# Patient Record
Sex: Female | Born: 1940
Health system: Southern US, Community
[De-identification: ages and names within clinical notes are randomized; demographics above are authoritative.]

## PROBLEM LIST (undated history)

## (undated) DIAGNOSIS — E7800 Pure hypercholesterolemia, unspecified: Secondary | ICD-10-CM

## (undated) DIAGNOSIS — M109 Gout, unspecified: Secondary | ICD-10-CM

## (undated) DIAGNOSIS — E039 Hypothyroidism, unspecified: Secondary | ICD-10-CM

## (undated) DIAGNOSIS — H269 Unspecified cataract: Secondary | ICD-10-CM

## (undated) DIAGNOSIS — K219 Gastro-esophageal reflux disease without esophagitis: Secondary | ICD-10-CM

## (undated) DIAGNOSIS — K589 Irritable bowel syndrome without diarrhea: Secondary | ICD-10-CM

## (undated) DIAGNOSIS — I10 Essential (primary) hypertension: Secondary | ICD-10-CM

## (undated) DIAGNOSIS — R0982 Postnasal drip: Secondary | ICD-10-CM

## (undated) DIAGNOSIS — R3915 Urgency of urination: Secondary | ICD-10-CM

## (undated) DIAGNOSIS — D649 Anemia, unspecified: Secondary | ICD-10-CM

## (undated) DIAGNOSIS — M1990 Unspecified osteoarthritis, unspecified site: Secondary | ICD-10-CM

## (undated) DIAGNOSIS — D573 Sickle-cell trait: Secondary | ICD-10-CM

## (undated) HISTORY — DX: Irritable bowel syndrome, unspecified: K58.9

## (undated) HISTORY — PX: CATARACT EXTRACTION: SUR2

## (undated) HISTORY — PX: DILATION AND CURETTAGE OF UTERUS: SHX78

## (undated) HISTORY — PX: JOINT REPLACEMENT: SHX530

## (undated) HISTORY — PX: COLONOSCOPY W/ BIOPSIES AND POLYPECTOMY: SHX1376

## (undated) HISTORY — PX: ABDOMINAL HYSTERECTOMY: SHX81

---

## 1993-09-21 HISTORY — PX: ABDOMINAL HYSTERECTOMY: SHX81

## 2002-12-07 ENCOUNTER — Encounter: Admission: RE | Admit: 2002-12-07 | Discharge: 2002-12-07 | Payer: Self-pay | Admitting: Emergency Medicine

## 2002-12-07 ENCOUNTER — Encounter: Payer: Self-pay | Admitting: Emergency Medicine

## 2007-03-17 ENCOUNTER — Ambulatory Visit: Payer: Self-pay | Admitting: Gastroenterology

## 2007-03-30 ENCOUNTER — Encounter: Admission: RE | Admit: 2007-03-30 | Discharge: 2007-03-30 | Payer: Self-pay | Admitting: Emergency Medicine

## 2007-04-20 ENCOUNTER — Ambulatory Visit: Payer: Self-pay | Admitting: Gastroenterology

## 2007-04-20 ENCOUNTER — Encounter: Payer: Self-pay | Admitting: Gastroenterology

## 2007-06-07 ENCOUNTER — Ambulatory Visit: Payer: Self-pay | Admitting: Gastroenterology

## 2007-06-13 ENCOUNTER — Ambulatory Visit (HOSPITAL_COMMUNITY): Admission: RE | Admit: 2007-06-13 | Discharge: 2007-06-13 | Payer: Self-pay | Admitting: Gastroenterology

## 2007-08-09 ENCOUNTER — Ambulatory Visit: Payer: Self-pay | Admitting: Gastroenterology

## 2007-08-09 LAB — CONVERTED CEMR LAB
ALT: 36 units/L — ABNORMAL HIGH (ref 0–35)
AST: 27 units/L (ref 0–37)
Albumin: 3.5 g/dL (ref 3.5–5.2)
Alkaline Phosphatase: 85 units/L (ref 39–117)
BUN: 9 mg/dL (ref 6–23)
Basophils Absolute: 0.3 10*3/uL — ABNORMAL HIGH (ref 0.0–0.1)
Basophils Relative: 3.6 % — ABNORMAL HIGH (ref 0.0–1.0)
Bilirubin, Direct: 0.1 mg/dL (ref 0.0–0.3)
CO2: 28 meq/L (ref 19–32)
Calcium: 9.2 mg/dL (ref 8.4–10.5)
Chloride: 100 meq/L (ref 96–112)
Creatinine, Ser: 0.9 mg/dL (ref 0.4–1.2)
Eosinophils Absolute: 0 10*3/uL (ref 0.0–0.6)
Eosinophils Relative: 0.6 % (ref 0.0–5.0)
Ferritin: 345.3 ng/mL — ABNORMAL HIGH (ref 10.0–291.0)
Folate: 18.5 ng/mL
GFR calc Af Amer: 81 mL/min
GFR calc non Af Amer: 67 mL/min
Glucose, Bld: 275 mg/dL — ABNORMAL HIGH (ref 70–99)
HCT: 37.7 % (ref 36.0–46.0)
Hemoglobin: 12.5 g/dL (ref 12.0–15.0)
Iron: 80 ug/dL (ref 42–145)
Lymphocytes Relative: 29.4 % (ref 12.0–46.0)
MCHC: 33 g/dL (ref 30.0–36.0)
MCV: 85.8 fL (ref 78.0–100.0)
Monocytes Absolute: 0.7 10*3/uL (ref 0.2–0.7)
Monocytes Relative: 8.6 % (ref 3.0–11.0)
Neutro Abs: 4.9 10*3/uL (ref 1.4–7.7)
Neutrophils Relative %: 57.8 % (ref 43.0–77.0)
Platelets: 283 10*3/uL (ref 150–400)
Potassium: 3.3 meq/L — ABNORMAL LOW (ref 3.5–5.1)
RBC: 4.39 M/uL (ref 3.87–5.11)
RDW: 11.4 % — ABNORMAL LOW (ref 11.5–14.6)
Saturation Ratios: 26.9 % (ref 20.0–50.0)
Sodium: 139 meq/L (ref 135–145)
TSH: 1.3 microintl units/mL (ref 0.35–5.50)
Total Bilirubin: 0.7 mg/dL (ref 0.3–1.2)
Total Protein: 7.5 g/dL (ref 6.0–8.3)
Transferrin: 212.7 mg/dL (ref >=212.0)
Vitamin B-12: 295 pg/mL (ref 211–911)
WBC: 8.3 10*3/uL (ref 4.5–10.5)

## 2007-08-31 ENCOUNTER — Ambulatory Visit: Payer: Self-pay | Admitting: Gastroenterology

## 2007-08-31 LAB — CONVERTED CEMR LAB
Fecal Occult Blood: NEGATIVE
OCCULT 1: NEGATIVE
OCCULT 2: NEGATIVE
OCCULT 3: NEGATIVE
OCCULT 4: NEGATIVE
OCCULT 5: NEGATIVE

## 2007-09-26 ENCOUNTER — Encounter: Admission: RE | Admit: 2007-09-26 | Discharge: 2007-09-26 | Payer: Self-pay | Admitting: Emergency Medicine

## 2007-10-26 DIAGNOSIS — I10 Essential (primary) hypertension: Secondary | ICD-10-CM | POA: Insufficient documentation

## 2007-10-26 DIAGNOSIS — K2970 Gastritis, unspecified, without bleeding: Secondary | ICD-10-CM | POA: Insufficient documentation

## 2007-10-26 DIAGNOSIS — K449 Diaphragmatic hernia without obstruction or gangrene: Secondary | ICD-10-CM | POA: Insufficient documentation

## 2007-10-26 DIAGNOSIS — K921 Melena: Secondary | ICD-10-CM | POA: Insufficient documentation

## 2007-10-26 DIAGNOSIS — D573 Sickle-cell trait: Secondary | ICD-10-CM | POA: Insufficient documentation

## 2007-10-26 DIAGNOSIS — K219 Gastro-esophageal reflux disease without esophagitis: Secondary | ICD-10-CM | POA: Insufficient documentation

## 2007-10-26 DIAGNOSIS — D539 Nutritional anemia, unspecified: Secondary | ICD-10-CM | POA: Insufficient documentation

## 2007-10-26 DIAGNOSIS — K5909 Other constipation: Secondary | ICD-10-CM | POA: Insufficient documentation

## 2007-10-26 DIAGNOSIS — K2990 Gastroduodenitis, unspecified, without bleeding: Secondary | ICD-10-CM | POA: Insufficient documentation

## 2007-10-26 DIAGNOSIS — D239 Other benign neoplasm of skin, unspecified: Secondary | ICD-10-CM | POA: Insufficient documentation

## 2008-07-17 ENCOUNTER — Emergency Department (HOSPITAL_COMMUNITY): Admission: EM | Admit: 2008-07-17 | Discharge: 2008-07-17 | Payer: Self-pay | Admitting: Emergency Medicine

## 2008-08-13 ENCOUNTER — Ambulatory Visit (HOSPITAL_COMMUNITY): Admission: RE | Admit: 2008-08-13 | Discharge: 2008-08-14 | Payer: Self-pay | Admitting: Obstetrics and Gynecology

## 2008-11-20 ENCOUNTER — Encounter: Admission: RE | Admit: 2008-11-20 | Discharge: 2008-11-20 | Payer: Self-pay | Admitting: Orthopaedic Surgery

## 2008-12-11 ENCOUNTER — Ambulatory Visit (HOSPITAL_BASED_OUTPATIENT_CLINIC_OR_DEPARTMENT_OTHER): Admission: RE | Admit: 2008-12-11 | Discharge: 2008-12-11 | Payer: Self-pay | Admitting: Orthopaedic Surgery

## 2008-12-11 HISTORY — PX: KNEE ARTHROSCOPY: SUR90

## 2009-09-26 ENCOUNTER — Inpatient Hospital Stay (HOSPITAL_COMMUNITY): Admission: RE | Admit: 2009-09-26 | Discharge: 2009-10-01 | Payer: Self-pay | Admitting: Orthopaedic Surgery

## 2009-09-26 HISTORY — PX: TOTAL KNEE ARTHROPLASTY: SHX125

## 2009-10-30 ENCOUNTER — Inpatient Hospital Stay (HOSPITAL_COMMUNITY): Admission: RE | Admit: 2009-10-30 | Discharge: 2009-11-05 | Payer: Self-pay | Admitting: Orthopaedic Surgery

## 2009-10-30 HISTORY — PX: KNEE JOINT MANIPULATION: SHX386

## 2010-10-12 ENCOUNTER — Encounter: Payer: Self-pay | Admitting: Unknown Physician Specialty

## 2010-10-13 ENCOUNTER — Encounter: Payer: Self-pay | Admitting: Gastroenterology

## 2010-10-21 NOTE — Procedures (Signed)
Summary: Gastroenterology -COLON  Gastroenterology -COLON   Imported By: Francee Piccolo CMA 10/27/2007 15:17:58  _____________________________________________________________________  External Attachment:    Type:   Image     Comment:   External Document

## 2010-10-21 NOTE — Procedures (Signed)
Summary: Gastroenterology COLON  Gastroenterology COLON   Imported By: Francee Piccolo CMA 10/27/2007 15:19:37  _____________________________________________________________________  External Attachment:    Type:   Image     Comment:   External Document

## 2010-12-07 LAB — CBC
HCT: 22.6 % — ABNORMAL LOW (ref 36.0–46.0)
HCT: 23.4 % — ABNORMAL LOW (ref 36.0–46.0)
HCT: 25.8 % — ABNORMAL LOW (ref 36.0–46.0)
HCT: 28.1 % — ABNORMAL LOW (ref 36.0–46.0)
HCT: 37.5 % (ref 36.0–46.0)
Hemoglobin: 12.6 g/dL (ref 12.0–15.0)
Hemoglobin: 7.6 g/dL — ABNORMAL LOW (ref 12.0–15.0)
Hemoglobin: 7.9 g/dL — ABNORMAL LOW (ref 12.0–15.0)
Hemoglobin: 8.6 g/dL — ABNORMAL LOW (ref 12.0–15.0)
Hemoglobin: 9.6 g/dL — ABNORMAL LOW (ref 12.0–15.0)
MCHC: 33.3 g/dL (ref 30.0–36.0)
MCHC: 33.6 g/dL (ref 30.0–36.0)
MCHC: 33.8 g/dL (ref 30.0–36.0)
MCHC: 33.8 g/dL (ref 30.0–36.0)
MCHC: 33.8 g/dL (ref 30.0–36.0)
MCV: 86.5 fL (ref 78.0–100.0)
MCV: 87 fL (ref 78.0–100.0)
MCV: 87.2 fL (ref 78.0–100.0)
MCV: 87.5 fL (ref 78.0–100.0)
MCV: 87.8 fL (ref 78.0–100.0)
Platelets: 197 10*3/uL (ref 150–400)
Platelets: 202 10*3/uL (ref 150–400)
Platelets: 211 10*3/uL (ref 150–400)
Platelets: 250 10*3/uL (ref 150–400)
Platelets: 268 10*3/uL (ref 150–400)
RBC: 2.59 MIL/uL — ABNORMAL LOW (ref 3.87–5.11)
RBC: 2.67 MIL/uL — ABNORMAL LOW (ref 3.87–5.11)
RBC: 2.94 MIL/uL — ABNORMAL LOW (ref 3.87–5.11)
RBC: 3.23 MIL/uL — ABNORMAL LOW (ref 3.87–5.11)
RBC: 4.33 MIL/uL (ref 3.87–5.11)
RDW: 12.5 % (ref 11.5–15.5)
RDW: 12.6 % (ref 11.5–15.5)
RDW: 12.8 % (ref 11.5–15.5)
RDW: 13 % (ref 11.5–15.5)
RDW: 13 % (ref 11.5–15.5)
WBC: 10.8 10*3/uL — ABNORMAL HIGH (ref 4.0–10.5)
WBC: 12.7 10*3/uL — ABNORMAL HIGH (ref 4.0–10.5)
WBC: 13.9 10*3/uL — ABNORMAL HIGH (ref 4.0–10.5)
WBC: 14.5 10*3/uL — ABNORMAL HIGH (ref 4.0–10.5)
WBC: 7.9 10*3/uL (ref 4.0–10.5)

## 2010-12-07 LAB — GLUCOSE, CAPILLARY
Glucose-Capillary: 101 mg/dL — ABNORMAL HIGH (ref 70–99)
Glucose-Capillary: 110 mg/dL — ABNORMAL HIGH (ref 70–99)
Glucose-Capillary: 124 mg/dL — ABNORMAL HIGH (ref 70–99)
Glucose-Capillary: 131 mg/dL — ABNORMAL HIGH (ref 70–99)
Glucose-Capillary: 132 mg/dL — ABNORMAL HIGH (ref 70–99)
Glucose-Capillary: 134 mg/dL — ABNORMAL HIGH (ref 70–99)
Glucose-Capillary: 134 mg/dL — ABNORMAL HIGH (ref 70–99)
Glucose-Capillary: 135 mg/dL — ABNORMAL HIGH (ref 70–99)
Glucose-Capillary: 136 mg/dL — ABNORMAL HIGH (ref 70–99)
Glucose-Capillary: 138 mg/dL — ABNORMAL HIGH (ref 70–99)
Glucose-Capillary: 150 mg/dL — ABNORMAL HIGH (ref 70–99)
Glucose-Capillary: 155 mg/dL — ABNORMAL HIGH (ref 70–99)
Glucose-Capillary: 155 mg/dL — ABNORMAL HIGH (ref 70–99)
Glucose-Capillary: 156 mg/dL — ABNORMAL HIGH (ref 70–99)
Glucose-Capillary: 156 mg/dL — ABNORMAL HIGH (ref 70–99)
Glucose-Capillary: 158 mg/dL — ABNORMAL HIGH (ref 70–99)
Glucose-Capillary: 159 mg/dL — ABNORMAL HIGH (ref 70–99)
Glucose-Capillary: 161 mg/dL — ABNORMAL HIGH (ref 70–99)
Glucose-Capillary: 168 mg/dL — ABNORMAL HIGH (ref 70–99)
Glucose-Capillary: 183 mg/dL — ABNORMAL HIGH (ref 70–99)
Glucose-Capillary: 190 mg/dL — ABNORMAL HIGH (ref 70–99)
Glucose-Capillary: 92 mg/dL (ref 70–99)

## 2010-12-07 LAB — BASIC METABOLIC PANEL
BUN: 10 mg/dL (ref 6–23)
BUN: 12 mg/dL (ref 6–23)
BUN: 4 mg/dL — ABNORMAL LOW (ref 6–23)
BUN: 6 mg/dL (ref 6–23)
BUN: 8 mg/dL (ref 6–23)
CO2: 30 mEq/L (ref 19–32)
CO2: 32 mEq/L (ref 19–32)
CO2: 32 mEq/L (ref 19–32)
CO2: 32 mEq/L (ref 19–32)
CO2: 32 mEq/L (ref 19–32)
Calcium: 10.2 mg/dL (ref 8.4–10.5)
Calcium: 8.3 mg/dL — ABNORMAL LOW (ref 8.4–10.5)
Calcium: 8.7 mg/dL (ref 8.4–10.5)
Calcium: 8.9 mg/dL (ref 8.4–10.5)
Calcium: 8.9 mg/dL (ref 8.4–10.5)
Chloride: 100 mEq/L (ref 96–112)
Chloride: 100 mEq/L (ref 96–112)
Chloride: 91 mEq/L — ABNORMAL LOW (ref 96–112)
Chloride: 95 mEq/L — ABNORMAL LOW (ref 96–112)
Chloride: 99 mEq/L (ref 96–112)
Creatinine, Ser: 0.76 mg/dL (ref 0.4–1.2)
Creatinine, Ser: 0.78 mg/dL (ref 0.4–1.2)
Creatinine, Ser: 0.84 mg/dL (ref 0.4–1.2)
Creatinine, Ser: 0.84 mg/dL (ref 0.4–1.2)
Creatinine, Ser: 0.86 mg/dL (ref 0.4–1.2)
GFR calc Af Amer: >60 mL/min (ref >60)
GFR calc Af Amer: >60 mL/min (ref >60)
GFR calc Af Amer: >60 mL/min (ref >60)
GFR calc Af Amer: >60 mL/min (ref >60)
GFR calc Af Amer: >60 mL/min (ref >60)
GFR calc non Af Amer: >60 mL/min (ref >60)
GFR calc non Af Amer: >60 mL/min (ref >60)
GFR calc non Af Amer: >60 mL/min (ref >60)
GFR calc non Af Amer: >60 mL/min (ref >60)
GFR calc non Af Amer: >60 mL/min (ref >60)
Glucose, Bld: 101 mg/dL — ABNORMAL HIGH (ref 70–99)
Glucose, Bld: 145 mg/dL — ABNORMAL HIGH (ref 70–99)
Glucose, Bld: 149 mg/dL — ABNORMAL HIGH (ref 70–99)
Glucose, Bld: 157 mg/dL — ABNORMAL HIGH (ref 70–99)
Glucose, Bld: 164 mg/dL — ABNORMAL HIGH (ref 70–99)
Potassium: 3 mEq/L — ABNORMAL LOW (ref 3.5–5.1)
Potassium: 3.1 mEq/L — ABNORMAL LOW (ref 3.5–5.1)
Potassium: 3.6 mEq/L (ref 3.5–5.1)
Potassium: 3.9 mEq/L (ref 3.5–5.1)
Potassium: 4.1 mEq/L (ref 3.5–5.1)
Sodium: 131 mEq/L — ABNORMAL LOW (ref 135–145)
Sodium: 136 mEq/L (ref 135–145)
Sodium: 138 mEq/L (ref 135–145)
Sodium: 138 mEq/L (ref 135–145)
Sodium: 140 mEq/L (ref 135–145)

## 2010-12-07 LAB — PROTIME-INR
INR: 1.09 (ref 0.00–1.49)
INR: 1.16 (ref 0.00–1.49)
INR: 1.45 (ref 0.00–1.49)
INR: 1.5 — ABNORMAL HIGH (ref 0.00–1.49)
INR: 1.56 — ABNORMAL HIGH (ref 0.00–1.49)
INR: 1.8 — ABNORMAL HIGH (ref 0.00–1.49)
Prothrombin Time: 14 seconds (ref 11.6–15.2)
Prothrombin Time: 14.7 seconds (ref 11.6–15.2)
Prothrombin Time: 17.5 seconds — ABNORMAL HIGH (ref 11.6–15.2)
Prothrombin Time: 18 seconds — ABNORMAL HIGH (ref 11.6–15.2)
Prothrombin Time: 18.5 seconds — ABNORMAL HIGH (ref 11.6–15.2)
Prothrombin Time: 20.7 seconds — ABNORMAL HIGH (ref 11.6–15.2)

## 2010-12-07 LAB — TYPE AND SCREEN
ABO/RH(D): O POS
Antibody Screen: NEGATIVE

## 2010-12-07 LAB — HEMOGLOBIN A1C
Hgb A1c MFr Bld: 6.9 % — ABNORMAL HIGH (ref 4.6–6.1)
Mean Plasma Glucose: 151 mg/dL

## 2010-12-07 LAB — ABO/RH: ABO/RH(D): O POS

## 2010-12-10 LAB — GLUCOSE, CAPILLARY
Glucose-Capillary: 100 mg/dL — ABNORMAL HIGH (ref 70–99)
Glucose-Capillary: 104 mg/dL — ABNORMAL HIGH (ref 70–99)
Glucose-Capillary: 107 mg/dL — ABNORMAL HIGH (ref 70–99)
Glucose-Capillary: 108 mg/dL — ABNORMAL HIGH (ref 70–99)
Glucose-Capillary: 111 mg/dL — ABNORMAL HIGH (ref 70–99)
Glucose-Capillary: 113 mg/dL — ABNORMAL HIGH (ref 70–99)
Glucose-Capillary: 118 mg/dL — ABNORMAL HIGH (ref 70–99)
Glucose-Capillary: 122 mg/dL — ABNORMAL HIGH (ref 70–99)
Glucose-Capillary: 123 mg/dL — ABNORMAL HIGH (ref 70–99)
Glucose-Capillary: 123 mg/dL — ABNORMAL HIGH (ref 70–99)
Glucose-Capillary: 125 mg/dL — ABNORMAL HIGH (ref 70–99)
Glucose-Capillary: 127 mg/dL — ABNORMAL HIGH (ref 70–99)
Glucose-Capillary: 134 mg/dL — ABNORMAL HIGH (ref 70–99)
Glucose-Capillary: 134 mg/dL — ABNORMAL HIGH (ref 70–99)
Glucose-Capillary: 136 mg/dL — ABNORMAL HIGH (ref 70–99)
Glucose-Capillary: 136 mg/dL — ABNORMAL HIGH (ref 70–99)
Glucose-Capillary: 137 mg/dL — ABNORMAL HIGH (ref 70–99)
Glucose-Capillary: 140 mg/dL — ABNORMAL HIGH (ref 70–99)
Glucose-Capillary: 141 mg/dL — ABNORMAL HIGH (ref 70–99)
Glucose-Capillary: 142 mg/dL — ABNORMAL HIGH (ref 70–99)
Glucose-Capillary: 155 mg/dL — ABNORMAL HIGH (ref 70–99)
Glucose-Capillary: 156 mg/dL — ABNORMAL HIGH (ref 70–99)
Glucose-Capillary: 83 mg/dL (ref 70–99)
Glucose-Capillary: 88 mg/dL (ref 70–99)

## 2010-12-10 LAB — CBC
HCT: 34 % — ABNORMAL LOW (ref 36.0–46.0)
Hemoglobin: 11.1 g/dL — ABNORMAL LOW (ref 12.0–15.0)
MCHC: 32.8 g/dL (ref 30.0–36.0)
MCV: 85.7 fL (ref 78.0–100.0)
Platelets: 327 10*3/uL (ref 150–400)
RBC: 3.97 MIL/uL (ref 3.87–5.11)
RDW: 14.4 % (ref 11.5–15.5)
WBC: 9.5 10*3/uL (ref 4.0–10.5)

## 2010-12-10 LAB — BASIC METABOLIC PANEL
BUN: 10 mg/dL (ref 6–23)
CO2: 28 mEq/L (ref 19–32)
Calcium: 9.6 mg/dL (ref 8.4–10.5)
Chloride: 102 mEq/L (ref 96–112)
Creatinine, Ser: 0.74 mg/dL (ref 0.4–1.2)
GFR calc Af Amer: >60 mL/min (ref >60)
GFR calc non Af Amer: >60 mL/min (ref >60)
Glucose, Bld: 83 mg/dL (ref 70–99)
Potassium: 3.3 mEq/L — ABNORMAL LOW (ref 3.5–5.1)
Sodium: 137 mEq/L (ref 135–145)

## 2010-12-10 LAB — URINALYSIS, ROUTINE W REFLEX MICROSCOPIC
Bilirubin Urine: NEGATIVE
Glucose, UA: NEGATIVE mg/dL
Hgb urine dipstick: NEGATIVE
Ketones, ur: NEGATIVE mg/dL
Nitrite: NEGATIVE
Protein, ur: NEGATIVE mg/dL
Specific Gravity, Urine: 1.018 (ref 1.005–1.030)
Urobilinogen, UA: 0.2 mg/dL (ref 0.0–1.0)
pH: 5.5 (ref 5.0–8.0)

## 2010-12-10 LAB — PROTIME-INR
INR: 1.09 (ref 0.00–1.49)
Prothrombin Time: 14 seconds (ref 11.6–15.2)

## 2010-12-10 LAB — HEMOGLOBIN A1C
Hgb A1c MFr Bld: 6.3 % — ABNORMAL HIGH (ref 4.6–6.1)
Mean Plasma Glucose: 134 mg/dL

## 2010-12-10 LAB — APTT: aPTT: 27 seconds (ref 24–37)

## 2010-12-16 ENCOUNTER — Other Ambulatory Visit: Payer: Self-pay | Admitting: Geriatric Medicine

## 2010-12-16 DIAGNOSIS — Z1231 Encounter for screening mammogram for malignant neoplasm of breast: Secondary | ICD-10-CM

## 2011-01-01 LAB — POCT I-STAT, CHEM 8
BUN: 14 mg/dL (ref 6–23)
Calcium, Ion: 1.25 mmol/L (ref 1.12–1.32)
Chloride: 102 mEq/L (ref 96–112)
Creatinine, Ser: 0.9 mg/dL (ref 0.4–1.2)
Glucose, Bld: 116 mg/dL — ABNORMAL HIGH (ref 70–99)
HCT: 43 % (ref 36.0–46.0)
Hemoglobin: 14.6 g/dL (ref 12.0–15.0)
Potassium: 4 mEq/L (ref 3.5–5.1)
Sodium: 140 mEq/L (ref 135–145)
TCO2: 33 mmol/L (ref 0–100)

## 2011-01-01 LAB — GLUCOSE, CAPILLARY: Glucose-Capillary: 75 mg/dL (ref 70–99)

## 2011-01-16 ENCOUNTER — Ambulatory Visit: Payer: Self-pay

## 2011-01-19 ENCOUNTER — Ambulatory Visit: Payer: Self-pay

## 2011-01-20 ENCOUNTER — Inpatient Hospital Stay: Admission: RE | Admit: 2011-01-20 | Payer: Self-pay | Source: Ambulatory Visit

## 2011-01-28 ENCOUNTER — Ambulatory Visit: Payer: Self-pay

## 2011-01-29 ENCOUNTER — Ambulatory Visit
Admission: RE | Admit: 2011-01-29 | Discharge: 2011-01-29 | Disposition: A | Discharge status: Home / Self Care | Payer: Medicare Other | Source: Ambulatory Visit | Attending: Geriatric Medicine | Admitting: Geriatric Medicine

## 2011-01-29 DIAGNOSIS — Z1231 Encounter for screening mammogram for malignant neoplasm of breast: Secondary | ICD-10-CM

## 2011-02-03 NOTE — Assessment & Plan Note (Signed)
[facility]                         GASTROENTEROLOGY OFFICE NOTE   [facility], Edwards                      MRN:          [id number]  DATE:03/17/2007                            DOB:          1940/11/13    REFERRING PHYSICIAN:  Reuben Edwards, M.D.   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  She is referred through the courtesy of [facility] for  evaluation of guaiac positive stools.   Taylor Edwards has a long GI history going back some 10 to 15 years when  she used to be a patient of [facility].  She has had chronic functional  constipation with laxative dependency for many years, and apparently in  the past has had flexible sigmoidoscopy exams and barium enemas, and  upper GI series, all of which have been negative per the patient's  historical recollection.  She currently has rather marked constipation  and takes daily herbal tea.  She has had no real melena or hematochezia,  but did have a guaiac positive stool test on Hemoccult per [facility].  She additionally has chronic acid reflux symptoms and she takes over-the-  counter PPI therapy after being on Prevacid 30 mg a day when she had  samples.  She follows a regular diet and denies lactose intolerance, or  any other food intolerances.  Her appetite is good.  Her weight is  stable.  She has had no dysphagia, odynophagia, or known hepatobiliary  problems.   PAST MEDICAL HISTORY:  Remarkable for sickle cell trait and she  occasionally has hemolysis, and occasionally takes iron tablets.  She  has had previous appendectomy and hysterectomy.  Recent lab data from  [facility] office showed a normal CBC, metabolic profile, and thyroid  function test.  She does have mild hypertension.   MEDICATIONS:  1. Hydrochlorothiazide 25 mg a day.  2. Imipramine 25 mg a day.  3. Calcium with vitamin D daily.  4. Over-the-counter PPI.   She is intolerant to  ASPIRIN products.   FAMILY HISTORY:  Entirely noncontributory without any known history of  colon polyps or carcinoma.  There is atherogenesis, diabetes, and  alcoholism in her family.   SOCIAL HISTORY:  She is divorced and lives by herself.  She does not  smoke, or abuse ethanol.   REVIEW OF SYSTEMS:  Entirely noncontributory without current  cardiovascular, pulmonary, genitourinary, neurologic, or endocrine  problems.   PHYSICAL EXAM:  She is a healthy-appearing black female appearing her  stated age.  She is in no acute distress.  She is 5 feet 2 inches tall and weighs 212 pounds.  Blood pressure is  134/62, and pulse was 100 and regular.  I could not appreciate stigmata of chronic liver disease.  CHEST:  Clear.  She was in a regular rhythm without murmurs, gallops, or rubs.  ABDOMEN:  Showed mild obesity, but no organomegaly, masses, or  tenderness.  Peripheral extremities were unremarkable.  Mental status was clear.  RECTAL:  Exam was deferred.   ASSESSMENT:  1. Guaiac positive stools  with chronic functional constipation - rule      out colonic polyposis or carcinoma.  2. Chronic gastroesophageal reflux disease - rule out Barrett's      mucosa.  3. Sickle cell trait and chronic anemia.  4. History of aspirin sensitivity.  5. Status post hysterectomy and appendectomy.  6. History of essential hypertension on hydrochlorothiazide.  7. Dermatofibromas.   RECOMMENDATIONS:  1. Outpatient endoscopy and colonoscopy at her convenience.  2. I have given her more samples of Prevacid to take 30 mg before      breakfast each morning.  3. Continue other medications.  Followup with [facility] as previously      planned.     Taylor Rea. Jarold Motto, MD, [facility], FAGA  Electronically Signed    DRP/MedQ  DD: 03/17/2007  DT: 03/17/2007  Job #: [id number]

## 2011-02-03 NOTE — Assessment & Plan Note (Signed)
[facility]                         GASTROENTEROLOGY OFFICE NOTE   Taylor Edwards, Taylor Edwards                      MRN:          [id number]  DATE:08/09/2007                            DOB:          [birth year]    Nolene continues to have some epigastric discomfort which is worse when  she runs out of her Prevacid which she has taken twice a day.  She is  also using PC Carafate and Amitiza 8 mcg twice a day.  Her ultrasound  was unremarkable except for what appeared to be some fatty infiltration  of her liver.  Review of previous biopsy showed melanosis coli but no  evidence of Helicobacter infection.  I do not have any lab data  recently.   She is a healthy-appearing black female in no distress.  She weighs 212  pounds which is the same as December.  Blood pressure was 110/80 and  pulse was 100 and regular.  I could not appreciate hepatosplenomegaly,  abdominal masses or tenderness.  Bowel sounds were normal.   ASSESSMENT:  1. Chronic gastroesophageal reflux disease with fairly negative      endoscopy.  2. Probable fatty infiltration of the liver.  3. Chronic functional constipation.   RECOMMENDATIONS:  1. Change from Prevacid to Protonix 40 mg twice a day since this is a      tier II on a drug plan.  2. Continue all other medications as listed above with drug samples      given.  3. Check CBC and metabolic profile including liver function tests.     Vania Rea. Jarold Motto, MD, Caleen Essex, FAGA  Electronically Signed    DRP/MedQ  DD: 08/09/2007  DT: 08/10/2007  Job #: [id number]

## 2011-02-03 NOTE — Assessment & Plan Note (Signed)
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 Lorenz Coaster for guaiac positive stools  and rather marked acid reflux symptoms.  She underwent endoscopy on April 20, 2007, which was fairly unremarkable.  It included biopsies of  esophagus.  Biopsies for H.pylori gastritis were negative.  She also had  a colonoscopy exam which was entirely normal.   Despite being on Prevacid 30 mg daily, the patient continues to  complaint of subxiphoid discomfort and burning substernal chest pain  with regurgitation.  She denies dysphagia or odynophagia.  She has no  specific hepatobiliary complaints, but I cannot see where she has had  previous gallbladder ultrasound exam.  She has been on Amitiza 24 mcg  b.i.d. with rather marked improvement in her gas, bloating and  constipation.  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  I do not have any recent laboratory data concerning  this patient, but from what I gather, she has not been anemic.  She does  take daily HCTZ 25 mg daily and imipramine 25 mg daily.   She weighs 212 pounds which is her normal weight and blood pressure  132/80.  Pulse was 92 and regular.  She was a healthy appearing, middle-  aged black female in no acute distress.  I could not appreciate stigmata  of chronic liver disease.  Chest was clear, and she is in a regular  rhythm without murmurs, gallops, rubs.  I could not appreciate  hepatosplenomegaly, abdominal masses or specific areas of tenderness.  Bowel sounds were normal.  Rectal exam was deferred today.   ASSESSMENT:  1. Chronic  functional constipation doing well on Amitiza.  2. Guaiac positive stools which is probably a false positive exam.  3. Chronic GERD - rule out cholelithiasis.  4. Well-controlled essential hypertension.  5. History of sickle cell trait chronic anemia.  6. Status post hysterectomy and appendectomy.   RECOMMENDATIONS:  1. Increase Prevacid to 30 mg b.i.d. 30 minutes before breakfast and      supper.  2. Trial of Carafate suspension 15-20 minutes after meals and at      bedtime with standard antireflux maneuvers.  3. Outpatient abdominal ultrasound exam.  4. Continue b.i.d. Amitiza therapy.  5. GI follow up in one months time and will repeat hemoccult cards at      that time.     Vania Rea. Jarold Motto, MD, Caleen Essex, FAGA  Electronically Signed    DRP/MedQ  DD: 06/07/2007  DT: 06/08/2007  Job #: [id number]   cc:   Reuben Likes, M.D.

## 2011-02-03 NOTE — Op Note (Signed)
NAMESAYLOR, MURRY               ACCOUNT NO.:  [id number]   MEDICAL RECORD NO.:  [id number]          PATIENT TYPE:  AMB   LOCATION:  DSC                          FACILITY:  [facility]   PHYSICIAN:  Lubertha Basque. Dalldorf, M.D.DATE OF BIRTH:  Jun 13, 1941   DATE OF PROCEDURE:  12/11/2008  DATE OF DISCHARGE:                               OPERATIVE REPORT   PREOPERATIVE DIAGNOSES:  1. Right knee chondromalacia patella.  2. Left knee chondromalacia patella.   POSTOPERATIVE DIAGNOSES:  1. Right knee chondromalacia patella.  2. Left knee chondromalacia patella.   PROCEDURES:  1. Right knee arthroscopic chondroplasty.  2. Left knee arthroscopic chondroplasty and removal of loose bodies.   ANESTHESIA:  General.   ATTENDING SURGEON:  Lubertha Basque. 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  She has persisted with bilateral knee pain and swelling since an  accident many months back.  This has persisted despite oral anti-  inflammatories and stretching as well as aspiration and injection.  She  has a relatively benign MRI scan on one knee.  She is presumed to have  chondromalacia patella with some intra-articular loose bodies and is  offered bilateral arthroscopies as she has continued pain at rest and  pain with activity.  Informed operative consent was obtained after  discussion of possible complications including reaction to anesthesia  and infection.   SUMMARY OF FINDINGS AND PROCEDURE:  Under general anesthesia,  arthroscopies of both knees were performed.  Findings were basically  identical.  She had grade 4 change patellofemoral and chondroplasties  were done.  She had articular loose bodies throughout the knees and  arthroscopic removal was done.  Medial and lateral compartments were  benign on both sides and ACLs were intact.   DESCRIPTION OF PROCEDURE:  The patient was taken to the operating suite  where general anesthetic was applied without difficulty.  She was  positioned supine and prepped and draped in normal sterile fashion.  After administration of IV Kefzol, an arthroscopy of the left knee was  performed through two portals.  Findings were as noted above.  Procedure  consisted of a thorough chondroplasty patellofemoral portion of her knee  which actually tracked in a relatively good position.  She had some  loose flaps of articular cartilage and grade 4 change across broad  areas.  Some intra-articular loose bodies were removed.  As mentioned  above, the medial and lateral compartments were benign, and the ACL was  intact.  She had then the opposite knee addressed.  We again performed  an arthroscopy through two portals and findings and procedure were  identical.  Both knees were thoroughly irrigated at the end of the case  and we did place some Depo-Medrol onto each knee along with Marcaine and  morphine.  Adaptic was applied to the portals on both sides followed by  dry gauze and loose Ace wraps.  Estimated blood loss and intraoperative  fluids obtained from anesthesia records.   DISPOSITION:  The patient was  extubated in the operating room and taken  to recovery room in stable addition.  She was to go home the same day  and follow up in the office in less than a week.  I will contact her by  phone tonight.      Lubertha Basque Jerl Santos, M.D.  Electronically Signed     PGD/MEDQ  D:  12/11/2008  T:  12/11/2008  Job:  [id number]

## 2011-02-03 NOTE — Op Note (Signed)
NAMECHRISHELLE, ZITO               ACCOUNT NO.:  [id number]   MEDICAL RECORD NO.:  [id number]          PATIENT TYPE:  OIB   LOCATION:  9305                          FACILITY:  [facility]   PHYSICIAN:  Miguel Aschoff, M.D.       DATE OF BIRTH:  May 14, 1941   DATE OF PROCEDURE:  08/13/2008  DATE OF DISCHARGE:                               OPERATIVE REPORT   PREOPERATIVE DIAGNOSIS:  Symptomatic rectocele.   POSTOPERATIVE DIAGNOSIS:  Symptomatic rectocele.   PROCEDURE:  Posterior colporrhaphy.   SURGEON:  Miguel Aschoff, MD   ASSISTANT:  Randye Lobo, MD   ANESTHESIA:  General.   COMPLICATIONS:  None.   JUSTIFICATION:  The patient is a 70 year old black female who reports  pelvic pressure and a satisfactory intercourse and on examination, she  was noted to have a significant rectocele as well as relaxation of the  perineal body secondary to old obstetric trauma.  Because of the  symptoms associated with a rectocele, she is requested that repair be  carried out.  Informed consent has been obtained.  The patient is now  being taken to the operating room to undergo posterior colporrhaphy.   PROCEDURE:  The patient was taken to the operating and placed in the  supine position.  General anesthesia was administered without  difficulty.  She was then placed in a dorsal lithotomy position, prepped  and draped in the usual sterile fashion.  Bladder was catheterized.  At  this point, examination revealed genitalia to be somewhat disfigured  secondary to removal of the left Bartholin gland cyst with the left  labia minora midway from the urethra down to the perineum to be absent  on the left side.  Inspection revealed relaxed perineal body and a grade  2 to grade 3 rectocele.  No other abnormalities were noted.  The patient  has had previous hysterectomy.  At this point, Allis clamps were placed  on the junction of the vaginal mucosa and perineal skin.  The posterior  vaginal wall was injected with 1%  Xylocaine with epinephrine.  An  ellipse of perineal skin was then incised and elevated and then  dissection was carried out in the midline of the vagina for  approximately 6 cm, freeing the pararectal fascia and from the vaginal  mucosa.  Once the rectocele had been exposed, the rectocele was reduced  using pursestring suture of 2-0 Vicryl and the fascia was repaired by  using interrupted 0 Vicryl sutures.  Once this was done, the excess  vaginal mucosa was trimmed, then using running interlocking 2 Vicryl  suture, the vaginal mucosa was reapproximated.  At this point, attention  was directed to the perineal body.  The prior scarring, which had been  present,  had been opened to restore the anatomy to normal  configuration, and then the anatomy was reconfigured using interrupted 0  Vicryl sutures, reapproximating the tissues of the perineal body in the  left, right, and midline.  After this was done, there was good support.  The remaining excess skin and vaginal mucosa was trimmed and then the  perineum was reconstituted by using subcutaneous 2-0 Vicryl suture in a  running fashion and then using subcuticular 2-0 Vicryl to close the  perineal skin.  At this point, iodoform pack was placed in the vagina.  The blood loss for the procedure had been  minimal.  The patient was then reversed from the anesthetic and taken to  recovery room in satisfactory condition.  The estimated blood loss again  was minimal.  Plan is for the patient be observed overnight and  discharged home on August 14, 2008.      Miguel Aschoff, M.D.  Electronically Signed     AR/MEDQ  D:  08/13/2008  T:  08/14/2008  Job:  [id number]

## 2011-02-06 NOTE — Discharge Summary (Signed)
[facility], Taylor Edwards               ACCOUNT NO.:  [id number]   MEDICAL RECORD NO.:  [id number]          PATIENT TYPE:  OIB   LOCATION:  9305                          FACILITY:  [facility]   PHYSICIAN:  Miguel Aschoff, M.D.       DATE OF BIRTH:  08-29-1941   DATE OF ADMISSION:  08/13/2008  DATE OF DISCHARGE:  08/14/2008                               DISCHARGE SUMMARY   ADMISSION DIAGNOSIS:  Symptomatic rectocele.   FINAL DIAGNOSIS:  Symptomatic rectocele.   OPERATIONS AND PROCEDURES:  Posterior colporrhaphy, general anesthesia.   BRIEF HISTORY:  The patient is a 70 year old black female reported  symptoms associated with pelvic pressure and satisfactory intercourse  and on examination was noted to have significant rectocele and appeared  that be secondary to old obstetric trauma.  In addition, she is noted to  have some deformity of the left labia again also secondary to previous  obstetric trauma.  Because of her symptomatology she had requested that  this area be repaired and was admitted to the hospital to undergo  posterior colporrhaphy.  The risks and benefits of procedure were  discussed with the patient.   Preoperative studies were obtained.  This included admission hemoglobin  of 12.8, hematocrit 38.5, and white count 7600.  PT/PTT were within  normal limits.  Chemistry profile was essentially normal.  Urinalysis  was negative.  The patient had an EKG performed, which showed normal  sinus rhythm, minimal voltage criteria for LVH.   HOSPITAL COURSE:  Under general anesthesia on August 13, 2008 a  posterior colporrhaphy was carried out without difficulty.  The  patient's postoperative course was essentially uncomplicated other than  requiring pain medication the evening and morning after surgery.  The  patient did well, ambulated and was able to be sent home on August 14, 2008 in satisfactory condition.  Medications for home included Tylox  every 3 hours as needed for pain.   Estrace screen 2 g 2 times per week  in the vagina.  She is instructed to use a stool softener, refrain from  placing anything in vagina for 4 weeks.  The patient was to be seen back  in 4 weeks for followup.  Examination she was sent home on a regular  diet in satisfactory condition.   FINAL DIAGNOSIS:  Symptomatic rectocele.      Miguel Aschoff, M.D.  Electronically Signed     AR/MEDQ  D:  09/18/2008  T:  09/19/2008  Job:  [id number]

## 2011-04-13 ENCOUNTER — Telehealth: Payer: Self-pay | Admitting: Gastroenterology

## 2011-04-13 NOTE — Telephone Encounter (Signed)
Pt seen in 2008 per + guiac stool by Dr Idell Pickles. Hx of reflux, fatty liver and chronic constipation. Pt had COLON on 04/20/2007 that was normal except for melanosis from chronic laxative abuse. On f/u appt on 08/09/2007,  she was given samples of Amitiza for constipation and we haven't seen her since. Pt wants to know how often she can take laxatives, and I guess what laxative can she safely take? Thanks.

## 2011-04-14 NOTE — Telephone Encounter (Signed)
ok 

## 2011-04-14 NOTE — Telephone Encounter (Signed)
Informed pt she may take Miralax safely; may take BID until she is regular. Also, informed her she needs an OV. Pt reports she is due for her COLON in August of this year. Pt wants to know if it's safe to take Senekot-S daily- they used it in the nursing home so it must be safe. Informed her the "S" is a laxative and sometimes can cause cramping, but it's the only thing that works for her. Dr Jarold Motto, wants to know from you is Senekot-S safe to use daily? Pt is a Engineer, civil (consulting).

## 2011-04-14 NOTE — Telephone Encounter (Signed)
Needs ov...miralax is ok

## 2011-04-15 NOTE — Telephone Encounter (Signed)
Notified pt Dr Jarold Motto stated it's ok to take the Senekot S as a laxative. She will call later to schedule her OV.

## 2011-05-14 ENCOUNTER — Other Ambulatory Visit: Payer: Self-pay | Admitting: Obstetrics and Gynecology

## 2011-06-23 LAB — URINALYSIS, ROUTINE W REFLEX MICROSCOPIC
Bilirubin Urine: NEGATIVE
Glucose, UA: NEGATIVE
Hgb urine dipstick: NEGATIVE
Ketones, ur: NEGATIVE
Nitrite: NEGATIVE
Protein, ur: NEGATIVE
Specific Gravity, Urine: 1.005
Urobilinogen, UA: 0.2
pH: 5.5

## 2011-06-23 LAB — BASIC METABOLIC PANEL
BUN: 15
CO2: 31
Calcium: 9.4
Chloride: 104
Creatinine, Ser: 0.96
GFR calc Af Amer: >60
GFR calc non Af Amer: 58 — ABNORMAL LOW
Glucose, Bld: 99
Potassium: 3.5
Sodium: 143

## 2011-06-23 LAB — CBC
HCT: 32.7 — ABNORMAL LOW
HCT: 38.5
Hemoglobin: 10.9 — ABNORMAL LOW
Hemoglobin: 12.8
MCHC: 33.2
MCHC: 33.3
MCV: 86.7
MCV: 88
Platelets: 209
Platelets: 292
RBC: 3.72 — ABNORMAL LOW
RBC: 4.44
RDW: 13.5
RDW: 13.7
WBC: 7.6
WBC: 9.6

## 2011-06-23 LAB — APTT: aPTT: 27

## 2011-06-23 LAB — URINE MICROSCOPIC-ADD ON

## 2011-06-23 LAB — PROTIME-INR
INR: 1
Prothrombin Time: 13

## 2011-06-23 LAB — GLUCOSE, CAPILLARY: Glucose-Capillary: 105 — ABNORMAL HIGH

## 2011-08-21 ENCOUNTER — Encounter (HOSPITAL_BASED_OUTPATIENT_CLINIC_OR_DEPARTMENT_OTHER): Payer: Self-pay | Admitting: *Deleted

## 2011-08-21 NOTE — Pre-Procedure Instructions (Addendum)
To come for BMET and EKG  2 crowns upper front teeth  States current rash on knee

## 2011-08-22 NOTE — H&P (Signed)
Patient Name: Taylor Edwards DOB: 1941/08/15 MRN: [id number]  Date of Service: 08/19/2011  SUBJECTIVE:  Taylor Edwards is here with a multitude of questions and problems.  She mostly would like the right foot bunion taken care of.  She has had trouble with that for years and it has become progressively worse where she cannot wear shoes she would like to wear.  It limits her ability to remain active and she would like it fixed.  She has tried wide shoes and pads.  She has never had an injection in the area.  She describes her pain there as constant and moderate.  She says the opposite knee bothers her a bit, but not quite so badly.  Her other issues involve her right knee which remains a little painful anterolateral.  She feels a bit of a catch in it.  She also fell at the [facility] 3 or 4 months ago and injured her right wrist but is using some topicals on that and feels that it is getting better.  She also has questions about her back.  She has been through physical therapy and tried medications.  She even had some injections through Dr. Jordan Likes earlier this year.  PAST MEDICAL HISTORY:  Her past medical, family, and social histories are reviewed and are unchanged based on her most recent intake sheet which was done on 10/29/08.  Her medical coverage is now mostly through Dr. Merlene Laughter.  Her only allergy is perhaps to morphine which she thinks has caused some hair loss.  One change in her medications is that she has taken herself off her oral diabetes meds and is now just on hydrochlorothiazide and lisinopril for hypertension.  She says she has checked her blood sugars since coming off the medicines for diabetes and says they have been fine.  Review of systems reviewed thoroughly and all other systems are negative as related to the chief complaint.  PHYSICAL EXAM:  Seini remains a well-developed, well-nourished woman in no apparent distress and oriented x3.  She walks with a fairly normal gait.  She has well-healed  incisions about both knees and motion on each side is about 0 to 100 at this point.  She has some anterolateral pain at the right knee along the joint line and a bit of a catch that she can feel but that I do not really appreciate.  Again, I do not feel an effusion and there is no redness or warmth through the knee.  Hip motion is full and straight leg raise is negative although it causes some back pain.  She has pain to palpation about the low back and pain on rotation in the lumbar spine.  Her skin looks benign and there are no scars that I can appreciate.  Her right wrist looks relatively benign and there is some pain along the first dorsal compartment and her Lourena Simmonds is mildly positive.  RADIOGRAPHS:  X-rays were ordered, performed, and interpreted by me today.  We took 3 views of both knees and they show good placement of her components with no sign of any loosening.  We also took 3 views of each foot.  She does have a moderate-sized bunion on the right and a fairly small bunion on the left with no significant underlying degenerative change.  I pulled up some films of her lumbar spine from 07/02/10 and these look relatively good with no sign of fracture or significant malalignment.  ASSESSMENT:   1.    Right foot bunion. 2.  Lumbar spondylosis with history of injections earlier this year. 3.    Bilateral TKR 2011 with right-sided catch.   RE: Taylor, Edwards August 19, 2011 Page 2  PLAN:  Jernie really would like her bunion addressed.  I think a simple Silver bunionectomy would do her well and I have reviewed risks of anesthesia and infection related to this intervention.  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  In terms of her knees, I think she is doing relatively well.  I suppose one thing we could offer her is an injection  into the replaced right knee under sterile technique.  We could probably accomplish that at the time of her foot surgery.  In terms of her back, she would like this worked up more thoroughly with an MRI scan and I do not see any contraindication to that, so I will try to get that approved and set up through her insurance carrier.  I certainly am glad she has a little bit better motion about her knees and is fairly functional.  I am a bit concerned that she has stopped her diabetes medicine and I have told her she probably ought to run that by Dr. Pete Glatter.   Electronically verified by:    Lubertha Basque. Jerl Santos, MD

## 2011-08-24 ENCOUNTER — Encounter (HOSPITAL_BASED_OUTPATIENT_CLINIC_OR_DEPARTMENT_OTHER)
Admission: RE | Admit: 2011-08-24 | Discharge: 2011-08-24 | Disposition: A | Discharge status: Home / Self Care | Payer: Medicare Other | Source: Ambulatory Visit | Attending: Orthopaedic Surgery | Admitting: Orthopaedic Surgery

## 2011-08-24 ENCOUNTER — Other Ambulatory Visit: Payer: Self-pay

## 2011-08-24 ENCOUNTER — Other Ambulatory Visit: Payer: Self-pay | Admitting: Orthopaedic Surgery

## 2011-08-24 LAB — BASIC METABOLIC PANEL
BUN: 11 mg/dL (ref 6–23)
CO2: 31 mEq/L (ref 19–32)
Calcium: 9.7 mg/dL (ref 8.4–10.5)
Chloride: 105 mEq/L (ref 96–112)
Creatinine, Ser: 0.77 mg/dL (ref 0.50–1.10)
GFR calc Af Amer: >90 mL/min (ref >90)
GFR calc non Af Amer: 84 mL/min — ABNORMAL LOW (ref >90)
Glucose, Bld: 139 mg/dL — ABNORMAL HIGH (ref 70–99)
Potassium: 3.6 mEq/L (ref 3.5–5.1)
Sodium: 146 mEq/L — ABNORMAL HIGH (ref 135–145)

## 2011-08-25 ENCOUNTER — Encounter (HOSPITAL_BASED_OUTPATIENT_CLINIC_OR_DEPARTMENT_OTHER): Payer: Self-pay | Admitting: *Deleted

## 2011-08-25 ENCOUNTER — Encounter (HOSPITAL_BASED_OUTPATIENT_CLINIC_OR_DEPARTMENT_OTHER)
Admission: RE | Disposition: A | Discharge status: Home / Self Care | Payer: Self-pay | Source: Ambulatory Visit | Attending: Orthopaedic Surgery

## 2011-08-25 ENCOUNTER — Encounter (HOSPITAL_BASED_OUTPATIENT_CLINIC_OR_DEPARTMENT_OTHER): Payer: Self-pay | Admitting: Anesthesiology

## 2011-08-25 ENCOUNTER — Ambulatory Visit (HOSPITAL_BASED_OUTPATIENT_CLINIC_OR_DEPARTMENT_OTHER)
Admission: RE | Admit: 2011-08-25 | Discharge: 2011-08-25 | Disposition: A | Discharge status: Home / Self Care | Payer: Medicare Other | Source: Ambulatory Visit | Attending: Orthopaedic Surgery | Admitting: Orthopaedic Surgery

## 2011-08-25 ENCOUNTER — Ambulatory Visit (HOSPITAL_BASED_OUTPATIENT_CLINIC_OR_DEPARTMENT_OTHER): Payer: Medicare Other | Admitting: Anesthesiology

## 2011-08-25 DIAGNOSIS — I10 Essential (primary) hypertension: Secondary | ICD-10-CM | POA: Insufficient documentation

## 2011-08-25 DIAGNOSIS — M659 Unspecified synovitis and tenosynovitis, unspecified site: Secondary | ICD-10-CM | POA: Insufficient documentation

## 2011-08-25 DIAGNOSIS — E669 Obesity, unspecified: Secondary | ICD-10-CM | POA: Insufficient documentation

## 2011-08-25 DIAGNOSIS — E119 Type 2 diabetes mellitus without complications: Secondary | ICD-10-CM | POA: Insufficient documentation

## 2011-08-25 DIAGNOSIS — K219 Gastro-esophageal reflux disease without esophagitis: Secondary | ICD-10-CM | POA: Insufficient documentation

## 2011-08-25 DIAGNOSIS — Z01812 Encounter for preprocedural laboratory examination: Secondary | ICD-10-CM | POA: Insufficient documentation

## 2011-08-25 DIAGNOSIS — M21611 Bunion of right foot: Secondary | ICD-10-CM

## 2011-08-25 DIAGNOSIS — Z01810 Encounter for preprocedural cardiovascular examination: Secondary | ICD-10-CM | POA: Insufficient documentation

## 2011-08-25 DIAGNOSIS — M21619 Bunion of unspecified foot: Secondary | ICD-10-CM | POA: Insufficient documentation

## 2011-08-25 DIAGNOSIS — J45909 Unspecified asthma, uncomplicated: Secondary | ICD-10-CM | POA: Insufficient documentation

## 2011-08-25 HISTORY — DX: Sickle-cell trait: D57.3

## 2011-08-25 HISTORY — PX: BUNIONECTOMY: SHX129

## 2011-08-25 HISTORY — DX: Gastro-esophageal reflux disease without esophagitis: K21.9

## 2011-08-25 HISTORY — DX: Anemia, unspecified: D64.9

## 2011-08-25 HISTORY — PX: INJECTION KNEE: SHX2446

## 2011-08-25 HISTORY — DX: Unspecified cataract: H26.9

## 2011-08-25 HISTORY — DX: Unspecified osteoarthritis, unspecified site: M19.90

## 2011-08-25 HISTORY — DX: Essential (primary) hypertension: I10

## 2011-08-25 HISTORY — DX: Postnasal drip: R09.82

## 2011-08-25 HISTORY — DX: Urgency of urination: R39.15

## 2011-08-25 LAB — GLUCOSE, CAPILLARY
Glucose-Capillary: 134 mg/dL — ABNORMAL HIGH (ref 70–99)
Glucose-Capillary: 117 mg/dL — ABNORMAL HIGH (ref 70–99)

## 2011-08-25 SURGERY — BUNIONECTOMY
Anesthesia: General | Site: Knee | Laterality: Right | Wound class: Clean

## 2011-08-25 MED ORDER — CEFAZOLIN SODIUM-DEXTROSE 2-3 GM-% IV SOLR: 2.0000 g | INTRAVENOUS | Status: DC

## 2011-08-25 MED ORDER — LIDOCAINE HCL (CARDIAC) 20 MG/ML IV SOLN
INTRAVENOUS | Status: DC | PRN
  Administered 2011-08-25: 40 mg via INTRAVENOUS

## 2011-08-25 MED ORDER — OXYCODONE-ACETAMINOPHEN 5-325 MG PO TABS: 1.0000 | ORAL_TABLET | ORAL | Status: AC | PRN

## 2011-08-25 MED ORDER — LACTATED RINGERS IV SOLN
INTRAVENOUS | Status: DC
  Administered 2011-08-25: 11:00:00 via INTRAVENOUS

## 2011-08-25 MED ORDER — LACTATED RINGERS IV SOLN
INTRAVENOUS | Status: DC
  Administered 2011-08-25: 10:00:00 via INTRAVENOUS

## 2011-08-25 MED ORDER — FENTANYL CITRATE 0.05 MG/ML IJ SOLN
50.0000 ug | INTRAMUSCULAR | Status: DC | PRN
  Administered 2011-08-25: 100 ug via INTRAVENOUS

## 2011-08-25 MED ORDER — BUPIVACAINE-EPINEPHRINE PF 0.5-1:200000 % IJ SOLN
INTRAMUSCULAR | Status: DC | PRN
  Administered 2011-08-25: 50 mL

## 2011-08-25 MED ORDER — ACETAMINOPHEN 10 MG/ML IV SOLN
1000.0000 mg | Freq: Once | INTRAVENOUS | Status: AC
  Administered 2011-08-25: 1000 mg via INTRAVENOUS

## 2011-08-25 MED ORDER — PROPOFOL 10 MG/ML IV EMUL
INTRAVENOUS | Status: DC | PRN
  Administered 2011-08-25: 250 mg via INTRAVENOUS

## 2011-08-25 MED ORDER — MIDAZOLAM HCL 2 MG/2ML IJ SOLN
1.0000 mg | INTRAMUSCULAR | Status: DC | PRN
  Administered 2011-08-25 (×2): 2 mg via INTRAVENOUS

## 2011-08-25 MED ORDER — METHYLPREDNISOLONE ACETATE PF 80 MG/ML IJ SUSP
INTRAMUSCULAR | Status: DC | PRN
  Administered 2011-08-25: 12:00:00 via INTRA_ARTICULAR

## 2011-08-25 MED ORDER — EPHEDRINE SULFATE 50 MG/ML IJ SOLN
INTRAMUSCULAR | Status: DC | PRN
  Administered 2011-08-25: 15 mg via INTRAVENOUS

## 2011-08-25 MED ORDER — CHLORHEXIDINE GLUCONATE 4 % EX LIQD: 60.0000 mL | Freq: Once | CUTANEOUS | Status: DC

## 2011-08-25 MED ORDER — CEFAZOLIN SODIUM 1-5 GM-% IV SOLN: 1.0000 g | INTRAVENOUS | Status: DC

## 2011-08-25 SURGICAL SUPPLY — 73 items
BANDAGE ACE 4 STERILE (GAUZE/BANDAGES/DRESSINGS) ×3 IMPLANT
BANDAGE ELASTIC 3 VELCRO ST LF (GAUZE/BANDAGES/DRESSINGS) ×3 IMPLANT
BANDAGE ESMARK 6X9 LF (GAUZE/BANDAGES/DRESSINGS) ×2 IMPLANT
BANDAGE GAUZE ELAST BULKY 4 IN (GAUZE/BANDAGES/DRESSINGS) ×3 IMPLANT
BLADE AVERAGE 25X9 (BLADE) IMPLANT
BLADE CCA MICRO SAG (BLADE) IMPLANT
BLADE CRESCENTIC 13.5X.38X32 (BLADE) IMPLANT
BLADE OSC/SAG .038X5.5 CUT EDG (BLADE) IMPLANT
BLADE SURG 15 STRL LF DISP TIS (BLADE) ×4 IMPLANT
BLADE SURG 15 STRL SS (BLADE) ×2
BNDG ESMARK 6X9 LF (GAUZE/BANDAGES/DRESSINGS) ×3
CANISTER SUCTION 1200CC (MISCELLANEOUS) IMPLANT
CAP PIN PROTECTOR ORTHO WHT (CAP) IMPLANT
COVER TABLE BACK 60X90 (DRAPES) ×3 IMPLANT
CUFF TOURNIQUET SINGLE 18IN (TOURNIQUET CUFF) IMPLANT
CUFF TOURNIQUET SINGLE 24IN (TOURNIQUET CUFF) ×3 IMPLANT
CUFF TOURNIQUET SINGLE 34IN LL (TOURNIQUET CUFF) IMPLANT
DECANTER SPIKE VIAL GLASS SM (MISCELLANEOUS) IMPLANT
DRAPE EXTREMITY T 121X128X90 (DRAPE) ×3 IMPLANT
DRAPE OEC MINIVIEW 54X84 (DRAPES) IMPLANT
DRAPE U-SHAPE 47X51 STRL (DRAPES) ×3 IMPLANT
DRESSING TELFA 8X3 (GAUZE/BANDAGES/DRESSINGS) IMPLANT
DRSG EMULSION OIL 3X3 NADH (GAUZE/BANDAGES/DRESSINGS) IMPLANT
DRSG PAD ABDOMINAL 8X10 ST (GAUZE/BANDAGES/DRESSINGS) ×3 IMPLANT
DURAPREP 26ML APPLICATOR (WOUND CARE) ×3 IMPLANT
ELECT NEEDLE TIP 2.8 STRL (NEEDLE) IMPLANT
ELECT REM PT RETURN 9FT ADLT (ELECTROSURGICAL) ×3
ELECTRODE REM PT RTRN 9FT ADLT (ELECTROSURGICAL) ×2 IMPLANT
GAUZE SPONGE 4X4 16PLY XRAY LF (GAUZE/BANDAGES/DRESSINGS) IMPLANT
GAUZE XEROFORM 1X8 LF (GAUZE/BANDAGES/DRESSINGS) ×3 IMPLANT
GLOVE BIO SURGEON STRL SZ 6.5 (GLOVE) ×3 IMPLANT
GLOVE BIO SURGEON STRL SZ8.5 (GLOVE) ×3 IMPLANT
GLOVE BIOGEL PI IND STRL 8 (GLOVE) ×2 IMPLANT
GLOVE BIOGEL PI IND STRL 8.5 (GLOVE) ×2 IMPLANT
GLOVE BIOGEL PI INDICATOR 8 (GLOVE) ×1
GLOVE BIOGEL PI INDICATOR 8.5 (GLOVE) ×1
GLOVE INDICATOR 7.0 STRL GRN (GLOVE) ×3 IMPLANT
GLOVE SS BIOGEL STRL SZ 8 (GLOVE) ×2 IMPLANT
GLOVE SUPERSENSE BIOGEL SZ 8 (GLOVE) ×1
GOWN PREVENTION PLUS XLARGE (GOWN DISPOSABLE) ×3 IMPLANT
KWIRE 4.0 X .045IN (WIRE) IMPLANT
KWIRE 4.0 X .062IN (WIRE) IMPLANT
NEEDLE HYPO 22GX1.5 SAFETY (NEEDLE) IMPLANT
NEEDLE HYPO 25X1 1.5 SAFETY (NEEDLE) ×6 IMPLANT
NS IRRIG 1000ML POUR BTL (IV SOLUTION) ×3 IMPLANT
PACK BASIN DAY SURGERY FS (CUSTOM PROCEDURE TRAY) ×3 IMPLANT
PADDING CAST ABS 3INX4YD NS (CAST SUPPLIES) ×1
PADDING CAST ABS 4INX4YD NS (CAST SUPPLIES) ×1
PADDING CAST ABS COTTON 3X4 (CAST SUPPLIES) ×2 IMPLANT
PADDING CAST ABS COTTON 4X4 ST (CAST SUPPLIES) ×2 IMPLANT
PENCIL BUTTON HOLSTER BLD 10FT (ELECTRODE) ×3 IMPLANT
SHEET MEDIUM DRAPE 40X70 STRL (DRAPES) ×3 IMPLANT
SPONGE GAUZE 4X4 12PLY (GAUZE/BANDAGES/DRESSINGS) ×3 IMPLANT
STOCKINETTE 6  STRL (DRAPES) ×1
STOCKINETTE 6 STRL (DRAPES) ×2 IMPLANT
SUCTION FRAZIER TIP 10 FR DISP (SUCTIONS) IMPLANT
SUT BONE WAX W31G (SUTURE) IMPLANT
SUT ETHILON 3 0 PS 1 (SUTURE) IMPLANT
SUT ETHILON 4 0 PS 2 18 (SUTURE) IMPLANT
SUT ETHILON 5 0 PS 2 18 (SUTURE) IMPLANT
SUT VIC AB 0 CT1 27 (SUTURE)
SUT VIC AB 0 CT1 27XBRD ANBCTR (SUTURE) IMPLANT
SUT VIC AB 2-0 SH 27 (SUTURE)
SUT VIC AB 2-0 SH 27XBRD (SUTURE) IMPLANT
SUT VIC AB 4-0 P-3 18XBRD (SUTURE) IMPLANT
SUT VIC AB 4-0 P3 18 (SUTURE)
SYR BULB 3OZ (MISCELLANEOUS) ×3 IMPLANT
SYR CONTROL 10ML LL (SYRINGE) ×6 IMPLANT
TOWEL OR 17X24 6PK STRL BLUE (TOWEL DISPOSABLE) ×6 IMPLANT
TOWEL OR NON WOVEN STRL DISP B (DISPOSABLE) ×3 IMPLANT
TUBE CONNECTING 20X1/4 (TUBING) IMPLANT
UNDERPAD 30X30 INCONTINENT (UNDERPADS AND DIAPERS) ×3 IMPLANT
WATER STERILE IRR 1000ML POUR (IV SOLUTION) IMPLANT

## 2011-08-25 NOTE — Transfer of Care (Signed)
Immediate Anesthesia Transfer of Care Note  Patient: Taylor Edwards  Procedure(s) Performed:  Arbutus Leas; KNEE INJECTION  Patient Location: PACU  Anesthesia Type: General  Level of Consciousness: awake and alert   Airway & Oxygen Therapy: Patient Spontanous Breathing and Patient connected to face mask oxygen  Post-op Assessment: Report given to PACU RN and Post -op Vital signs reviewed and stable  Post vital signs: Reviewed and stable  Complications: No apparent anesthesia complications

## 2011-08-25 NOTE — Interval H&P Note (Signed)
History and Physical Interval Note:  08/25/2011 10:20 AM  Taylor Edwards  has presented today for surgery, with the diagnosis of bunion  The various methods of treatment have been discussed with the patient and family. After consideration of risks, benefits and other options for treatment, the patient has consented to  Procedure(s): BUNIONECTOMY as a surgical intervention .  The patients' history has been reviewed, patient examined, no change in status, stable for surgery.  I have reviewed the patients' chart and labs.  Questions were answered to the patient's satisfaction. Patient also wishes to have knee injections to help with ROM and catching.  Will perform bilat knee injections under sterile technique as well   Sohail Capraro G

## 2011-08-25 NOTE — Anesthesia Procedure Notes (Addendum)
Anesthesia Regional Block:  Popliteal block  Pre-Anesthetic Checklist: ,, timeout performed, Correct Patient, Correct Site, Correct Laterality, Correct Procedure, Correct Position, site marked, Risks and benefits discussed,  Surgical consent,  Pre-op evaluation,  At surgeon's request and post-op pain management  Laterality: Right  Prep: chloraprep       Needles:   Needle Type: Stimulator Needle - 80     Needle Length: 8cm  Needle Gauge: 22 and 22 G    Additional Needles:  Procedures: nerve stimulator Popliteal block  Nerve Stimulator or Paresthesia:  Response: 0.5 mA,   Additional Responses:   Narrative:  Start time: 08/25/2011 10:31 AM End time: 08/25/2011 10:44 AM Injection made incrementally with aspirations every 5 mL.  Performed by: Personally   Additional Notes: Pop Iv sed, skin local 22 stim needle w/ stim down to .5ma Mult neg asp No parest, other compl Dr Gypsy Balsam   Procedure Name: LMA Insertion Performed by: Zenia Resides D Pre-anesthesia Checklist: Patient identified, Emergency Drugs available, Suction available, Patient being monitored and Timeout performed Patient Re-evaluated:Patient Re-evaluated prior to inductionOxygen Delivery Method: Circle System Utilized Preoxygenation: Pre-oxygenation with 100% oxygen Intubation Type: IV induction Ventilation: Mask ventilation without difficulty LMA: LMA with gastric port inserted LMA Size: 4.0 Number of attempts: 1 Placement Confirmation: breath sounds checked- equal and bilateral and positive ETCO2 Tube secured with: Tape Dental Injury: Teeth and Oropharynx as per pre-operative assessment

## 2011-08-25 NOTE — Anesthesia Preprocedure Evaluation (Signed)
Anesthesia Evaluation  Patient identified by MRN, date of birth, ID band Patient awake    Reviewed: Allergy & Precautions, H&P , NPO status , Patient's Chart, lab work & pertinent test results  Airway Mallampati: II TM Distance: >3 FB Neck ROM: Full    Dental   Pulmonary asthma ,    Pulmonary exam normal       Cardiovascular hypertension,     Neuro/Psych  Neuromuscular disease    GI/Hepatic GERD-  ,  Endo/Other  Diabetes mellitus-  Renal/GU      Musculoskeletal   Abdominal (+) obese,   Peds  Hematology   Anesthesia Other Findings   Reproductive/Obstetrics                           Anesthesia Physical Anesthesia Plan  ASA: III  Anesthesia Plan: General   Post-op Pain Management:    Induction: Intravenous  Airway Management Planned: LMA  Additional Equipment:   Intra-op Plan:   Post-operative Plan: Extubation in OR  Informed Consent: I have reviewed the patients History and Physical, chart, labs and discussed the procedure including the risks, benefits and alternatives for the proposed anesthesia with the patient or authorized representative who has indicated his/her understanding and acceptance.     Plan Discussed with: CRNA and Surgeon  Anesthesia Plan Comments:         Anesthesia Quick Evaluation

## 2011-08-25 NOTE — Anesthesia Postprocedure Evaluation (Signed)
  Anesthesia Post-op Note  Patient: Taylor Edwards  Procedure(s) Performed:  Arbutus Leas; KNEE INJECTION  Patient Location: PACU  Anesthesia Type: GA combined with regional for post-op pain  Level of Consciousness: awake, alert  and oriented  Airway and Oxygen Therapy: Patient Spontanous Breathing  Post-op Pain: none  Post-op Assessment: Post-op Vital signs reviewed, Patient's Cardiovascular Status Stable, Respiratory Function Stable, Patent Airway, No signs of Nausea or vomiting, Adequate PO intake and Pain level controlled  Post-op Vital Signs: stable  Complications: No apparent anesthesia complications

## 2011-08-25 NOTE — Op Note (Signed)
NAME:  Taylor Edwards, Taylor Edwards                    ACCOUNT NO.:  MEDICAL RECORD NO.:  [id number]  LOCATION:                                 FACILITY:  PHYSICIAN:  Lubertha Basque. Jerl Santos, M.D.     DATE OF BIRTH:  DATE OF PROCEDURE:  08/25/2011 DATE OF DISCHARGE:                              OPERATIVE REPORT   PREOPERATIVE DIAGNOSES: 1. Right foot bunion. 2. Bilateral knee synovitis.  POSTOPERATIVE DIAGNOSES: 1. Right foot bunion. 2. Bilateral knee synovitis.  PROCEDURES: 1. Right foot bunionectomy. 2. Bilateral knee injections.  ANESTHESIA:  General and block.  ATTENDING SURGEON:  Lubertha Basque. Jerl Santos, MD  ASSISTANT:  Lindwood Qua, PA  INDICATION FOR PROCEDURE:  The patient is a 70 year old woman with a long history of a painful bunion in her right foot.  She has failed wide shoes and pads and has a bump.  She would like to excise as she is finding shoe wear difficulty.  She also has some synovitis of her knees, which have been replaced and which she wishes to have injection under anesthesia.  Informed operative consent was obtained for the aforementioned procedures after discussion of possible complications including reaction to anesthesia and infection.  SUMMARY OF FINDINGS AND PROCEDURE:  Under general anesthesia and an ankle block, a right foot bunionectomy was performed.  Used fluoroscopy throughout the case to confirm adequate resection.  We then injected both knees under extremely sterile technique.  DESCRIPTION OF PROCEDURE:  The patient was taken to the operating suite where general anesthetic was applied without difficulty.  She was also given a block in the preanesthesia area.  She was positioned supine, and prepped and draped in normal sterile fashion.  After administration of IV Kefzol, the right leg was elevated, exsanguinated, and tourniquet inflated up to calf.  A longitudinal incision was made along the dorsomedial aspect of the first MTP joint.  I did excise a  nodule elliptically during this incision.  The underlying capsule was then incised in a Y-shaped fashion with a distally based flap.  The bunion was exposed.  We used an oscillating saw and rongeur to remove the medial border of this portion of the foot roughly parallel with border of the foot.  Used fluoroscopy to confirm adequate resection.  The wound was irrigated, followed by reapproximation of capsular tissues with 0 Vicryl in interrupted fashion.  Skin was then closed with vertical mattresses of nylon.  The tourniquet was deflated, and her toe became pink and warm immediately.  We replaced Adaptic, dry gauze, and a loose Ace wrap.  We then sterilely prepped both knees with Betadine, followed by alcohol and injected both with Depo-Medrol and lidocaine.  Band-Aids were applied there. Estimated blood loss and intraoperative fluids can be obtained from anesthesia records as can accurate tourniquet time.  DISPOSITION:  The patient was extubated in the operating room and taken to recovery room in stable condition.  She was to go home the same-day and follow up in the office in less than a week.  I will contact her by phone tonight.     Lubertha Basque Jerl Santos, M.D.     PGD/MEDQ  D:  08/25/2011  T:  08/25/2011  Job:  [id number]

## 2011-08-25 NOTE — Brief Op Note (Signed)
Taylor Edwards [id number] 08/25/2011   PRE-OP DIAGNOSIS: Right foot bunion and Bil knee synovitis  POST-OP DIAGNOSIS: same  PROCEDURE: Right foot bunionectomy and Bil knee injections  ANESTHESIA: general and block  [name]   Dictation #:  [id number]

## 2011-08-26 LAB — POCT HEMOGLOBIN-HEMACUE: Hemoglobin: 13.8 g/dL (ref 12.0–15.0)

## 2011-08-28 ENCOUNTER — Encounter (HOSPITAL_BASED_OUTPATIENT_CLINIC_OR_DEPARTMENT_OTHER): Payer: Self-pay | Admitting: Orthopaedic Surgery

## 2011-11-23 ENCOUNTER — Emergency Department (HOSPITAL_COMMUNITY)
Admission: EM | Admit: 2011-11-23 | Discharge: 2011-11-24 | Discharge status: Against Medical Advice | Payer: Medicare Other | Attending: Emergency Medicine | Admitting: Emergency Medicine

## 2011-11-23 ENCOUNTER — Encounter (HOSPITAL_COMMUNITY): Payer: Self-pay | Admitting: Emergency Medicine

## 2011-11-23 DIAGNOSIS — Z0389 Encounter for observation for other suspected diseases and conditions ruled out: Secondary | ICD-10-CM | POA: Insufficient documentation

## 2011-11-23 DIAGNOSIS — IMO0002 Reserved for concepts with insufficient information to code with codable children: Secondary | ICD-10-CM

## 2011-11-23 LAB — COMPREHENSIVE METABOLIC PANEL
ALT: 18 U/L (ref 0–35)
AST: 17 U/L (ref 0–37)
Albumin: 3.7 g/dL (ref 3.5–5.2)
Alkaline Phosphatase: 72 U/L (ref 39–117)
BUN: 18 mg/dL (ref 6–23)
CO2: 26 mEq/L (ref 19–32)
Calcium: 10 mg/dL (ref 8.4–10.5)
Chloride: 101 mEq/L (ref 96–112)
Creatinine, Ser: 0.9 mg/dL (ref 0.50–1.10)
GFR calc Af Amer: 74 mL/min — ABNORMAL LOW (ref >90)
GFR calc non Af Amer: 64 mL/min — ABNORMAL LOW (ref >90)
Glucose, Bld: 166 mg/dL — ABNORMAL HIGH (ref 70–99)
Potassium: 3.5 mEq/L (ref 3.5–5.1)
Sodium: 140 mEq/L (ref 135–145)
Total Bilirubin: 0.1 mg/dL — ABNORMAL LOW (ref 0.3–1.2)
Total Protein: 7.7 g/dL (ref 6.0–8.3)

## 2011-11-23 LAB — CBC
HCT: 36.8 % (ref 36.0–46.0)
Hemoglobin: 12.1 g/dL (ref 12.0–15.0)
MCH: 27.9 pg (ref 26.0–34.0)
MCHC: 32.9 g/dL (ref 30.0–36.0)
MCV: 85 fL (ref 78.0–100.0)
Platelets: 248 10*3/uL (ref 150–400)
RBC: 4.33 MIL/uL (ref 3.87–5.11)
RDW: 13.6 % (ref 11.5–15.5)
WBC: 10.9 10*3/uL — ABNORMAL HIGH (ref 4.0–10.5)

## 2011-11-23 LAB — DIFFERENTIAL
Basophils Absolute: 0 10*3/uL (ref 0.0–0.1)
Basophils Relative: 0 % (ref 0–1)
Eosinophils Absolute: 0.1 10*3/uL (ref 0.0–0.7)
Eosinophils Relative: 1 % (ref 0–5)
Lymphocytes Relative: 37 % (ref 12–46)
Lymphs Abs: 4 10*3/uL (ref 0.7–4.0)
Monocytes Absolute: 0.7 10*3/uL (ref 0.1–1.0)
Monocytes Relative: 6 % (ref 3–12)
Neutro Abs: 6.2 10*3/uL (ref 1.7–7.7)
Neutrophils Relative %: 57 % (ref 43–77)

## 2011-11-23 LAB — SALICYLATE LEVEL: Salicylate Lvl: <2 mg/dL — ABNORMAL LOW (ref 2.8–20.0)

## 2011-11-23 LAB — ACETAMINOPHEN LEVEL: Acetaminophen (Tylenol), Serum: 16 ug/mL (ref 10–30)

## 2011-11-23 NOTE — ED Notes (Signed)
PT. REPORTS ACCIDENTALLY INGESTED APPROX.  15 TABS OF PERCOCET 5/325 MG THIS EVENING AT 9 PM , DENIES SUICIDAL IDEATION .  SLIGHT NAUSEA AT TRIAGE.

## 2011-11-24 NOTE — ED Notes (Signed)
Pt called this RN from home and stated she got home ok and not to worry about her.  Apologized for delays.  Pt stated she was fine and had to get home to turn off TV and had things to do tomorrow.

## 2011-11-24 NOTE — ED Notes (Signed)
Pt reports keeping meds on nightstand.  Pt was in bed and picked up wrong meds to take.  Pt states she thinks she took about 15 percocet but is unsure of exact amount.  Pt states she vomited after taking them and feels back to normal at this time.  Pt asking for A1-C and d/c home.

## 2011-12-25 ENCOUNTER — Other Ambulatory Visit: Payer: Self-pay | Admitting: Geriatric Medicine

## 2011-12-25 DIAGNOSIS — Z1231 Encounter for screening mammogram for malignant neoplasm of breast: Secondary | ICD-10-CM

## 2012-02-01 ENCOUNTER — Ambulatory Visit
Admission: RE | Admit: 2012-02-01 | Discharge: 2012-02-01 | Disposition: A | Discharge status: Home / Self Care | Payer: Medicare Other | Source: Ambulatory Visit | Attending: Geriatric Medicine | Admitting: Geriatric Medicine

## 2012-02-01 DIAGNOSIS — Z1231 Encounter for screening mammogram for malignant neoplasm of breast: Secondary | ICD-10-CM

## 2012-05-04 ENCOUNTER — Encounter: Payer: Self-pay | Admitting: Gastroenterology

## 2012-05-06 ENCOUNTER — Encounter: Payer: Self-pay | Admitting: Gastroenterology

## 2012-05-27 ENCOUNTER — Ambulatory Visit (AMBULATORY_SURGERY_CENTER): Payer: Medicare Other | Admitting: *Deleted

## 2012-05-27 VITALS — Ht 62.0 in | Wt 209.1 lb

## 2012-05-27 DIAGNOSIS — Z1211 Encounter for screening for malignant neoplasm of colon: Secondary | ICD-10-CM

## 2012-05-27 MED ORDER — MOVIPREP 100 G PO SOLR: ORAL | Status: DC

## 2012-05-30 ENCOUNTER — Encounter: Payer: Self-pay | Admitting: Gastroenterology

## 2012-06-08 ENCOUNTER — Ambulatory Visit (AMBULATORY_SURGERY_CENTER): Payer: Medicare Other | Admitting: Gastroenterology

## 2012-06-08 ENCOUNTER — Encounter: Payer: Self-pay | Admitting: Gastroenterology

## 2012-06-08 VITALS — BP 118/64 | HR 76 | Temp 97.1°F | Resp 18 | Ht 62.0 in | Wt 209.0 lb

## 2012-06-08 DIAGNOSIS — Z1211 Encounter for screening for malignant neoplasm of colon: Secondary | ICD-10-CM

## 2012-06-08 DIAGNOSIS — K5909 Other constipation: Secondary | ICD-10-CM

## 2012-06-08 DIAGNOSIS — K6389 Other specified diseases of intestine: Secondary | ICD-10-CM

## 2012-06-08 LAB — GLUCOSE, CAPILLARY: Glucose-Capillary: 117 mg/dL — ABNORMAL HIGH (ref 70–99)

## 2012-06-08 MED ORDER — SODIUM CHLORIDE 0.9 % IV SOLN: 500.0000 mL | INTRAVENOUS | Status: DC

## 2012-06-08 NOTE — Progress Notes (Signed)
No complaints noted in the recovery room. Maw  Patient did not have preoperative order for IV antibiotic SSI prophylaxis. (G8918) Patient did not experience any of the following events: a burn prior to discharge; a fall within the facility; wrong site/side/patient/procedure/implant event; or a hospital transfer or hospital admission upon discharge from the facility. (G8907)  

## 2012-06-08 NOTE — Patient Instructions (Addendum)
Your blood sugar was 117 in the recovery room.  You may resume your current medications today.  Please call if any questions or concerns.    YOU HAD AN ENDOSCOPIC PROCEDURE TODAY AT THE [facility]: Refer to the procedure report that was given to you for any specific questions about what was found during the examination.  If the procedure report does not answer your questions, please call your gastroenterologist to clarify.  If you requested that your care partner not be given the details of your procedure findings, then the procedure report has been included in a sealed envelope for you to review at your convenience later.  YOU SHOULD EXPECT: Some feelings of bloating in the abdomen. Passage of more gas than usual.  Walking can help get rid of the air that was put into your GI tract during the procedure and reduce the bloating. If you had a lower endoscopy (such as a colonoscopy or flexible sigmoidoscopy) you may notice spotting of blood in your stool or on the toilet paper. If you underwent a bowel prep for your procedure, then you may not have a normal bowel movement for a few days.  DIET: Your first meal following the procedure should be a light meal and then it is ok to progress to your normal diet.  A half-sandwich or bowl of soup is an example of a good first meal.  Heavy or fried foods are harder to digest and may make you feel nauseous or bloated.  Likewise meals heavy in dairy and vegetables can cause extra gas to form and this can also increase the bloating.  Drink plenty of fluids but you should avoid alcoholic beverages for 24 hours.  ACTIVITY: Your care partner should take you home directly after the procedure.  You should plan to take it easy, moving slowly for the rest of the day.  You can resume normal activity the day after the procedure however you should NOT DRIVE or use heavy machinery for 24 hours (because of the sedation medicines used during the test).    SYMPTOMS TO  REPORT IMMEDIATELY: A gastroenterologist can be reached at any hour.  During normal business hours, 8:30 AM to 5:00 PM Monday through Friday, call [phone].  After hours and on weekends, please call the GI answering service at [phone] who will take a message and have the physician on call contact you.   Following lower endoscopy (colonoscopy or flexible sigmoidoscopy):  Excessive amounts of blood in the stool  Significant tenderness or worsening of abdominal pains  Swelling of the abdomen that is new, acute  Fever of 100F or higher    FOLLOW UP: If any biopsies were taken you will be contacted by phone or by letter within the next 1-3 weeks.  Call your gastroenterologist if you have not heard about the biopsies in 3 weeks.  Our staff will call the home number listed on your records the next business day following your procedure to check on you and address any questions or concerns that you may have at that time regarding the information given to you following your procedure. This is a courtesy call and so if there is no answer at the home number and we have not heard from you through the emergency physician on call, we will assume that you have returned to your regular daily activities without incident.  SIGNATURES/CONFIDENTIALITY: You and/or your care partner have signed paperwork which will be entered into your electronic medical record.  These signatures attest to the fact that that the information above on your After Visit Summary has been reviewed and is understood.  Full responsibility of the confidentiality of this discharge information lies with you and/or your care-partner.  

## 2012-06-08 NOTE — Op Note (Signed)
[facility] [address] [place], [address]   COLONOSCOPY PROCEDURE REPORT  PATIENT: Taylor Edwards, Taylor Edwards  MR#: [id number] BIRTHDATE: 10-Sep-1941 , 71  yrs. old GENDER: Female ENDOSCOPIST: Mardella Layman, MD, [facility] REFERRED BY: PROCEDURE DATE:  06/08/2012 PROCEDURE:   Colonoscopy, screening ASA CLASS:   Class II INDICATIONS:average risk patient for colon cancer and constipation.  MEDICATIONS: Propofol (Diprivan) 120 mg IV  DESCRIPTION OF PROCEDURE:   After the risks and benefits and of the procedure were explained, informed consent was obtained.  A digital rectal exam revealed no abnormalities of the rectum.    The LB [id number]  endoscope was introduced through the anus and advanced to the cecum, which was identified by both the appendix and ileocecal valve .  The quality of the prep was good, using MoviPrep .  The instrument was then slowly withdrawn as the colon was fully examined.     COLON FINDINGS: MELANOSIS COLI THROUGHOUT THE COLON..SEE PICTURES. The colon mucosa was otherwise normal.     Retroflexed views revealed no abnormalities.     The scope was then withdrawn from the patient and the procedure completed.  COMPLICATIONS: There were no complications. ENDOSCOPIC IMPRESSION: 1.   MELANOSIS COLI THROUGHOUT THE COLON..SEE PICTURES. 2.   The colon mucosa was otherwise normal  RECOMMENDATIONS: 1.  continue current medications 2.  PRN COLON EXAMS AS NEEDED...  CC:Dr. Stoneking   REPEAT EXAM:  cc:  _______________________________ eSignedMardella Layman, MD, [facility] 06/08/2012 10:16 AM

## 2012-06-09 ENCOUNTER — Telehealth: Payer: Self-pay

## 2012-06-09 NOTE — Telephone Encounter (Signed)
  Follow up Call-  Call back number 06/08/2012  Post procedure Call Back phone  # [phone]  Permission to leave phone message Yes     Patient questions:  Do you have a fever, pain , or abdominal swelling? no Pain Score  0 *  Have you tolerated food without any problems? yes  Have you been able to return to your normal activities? yes  Do you have any questions about your discharge instructions: Diet   no Medications  no Follow up visit  no  Do you have questions or concerns about your Care? no  Actions: * If pain score is 4 or above: No action needed, pain <4.

## 2013-01-03 ENCOUNTER — Other Ambulatory Visit: Payer: Self-pay

## 2013-01-03 DIAGNOSIS — Z1231 Encounter for screening mammogram for malignant neoplasm of breast: Secondary | ICD-10-CM

## 2013-02-01 ENCOUNTER — Ambulatory Visit: Payer: Medicare Other

## 2013-02-15 ENCOUNTER — Ambulatory Visit
Admission: RE | Admit: 2013-02-15 | Discharge: 2013-02-15 | Disposition: A | Discharge status: Home / Self Care | Payer: Medicare Other | Source: Ambulatory Visit

## 2013-02-15 DIAGNOSIS — Z1231 Encounter for screening mammogram for malignant neoplasm of breast: Secondary | ICD-10-CM

## 2013-02-16 ENCOUNTER — Other Ambulatory Visit: Payer: Self-pay | Admitting: Geriatric Medicine

## 2013-02-16 DIAGNOSIS — R928 Other abnormal and inconclusive findings on diagnostic imaging of breast: Secondary | ICD-10-CM

## 2013-03-07 ENCOUNTER — Ambulatory Visit
Admission: RE | Admit: 2013-03-07 | Discharge: 2013-03-07 | Disposition: A | Discharge status: Home / Self Care | Payer: Medicare Other | Source: Ambulatory Visit | Attending: Geriatric Medicine | Admitting: Geriatric Medicine

## 2013-03-07 DIAGNOSIS — R928 Other abnormal and inconclusive findings on diagnostic imaging of breast: Secondary | ICD-10-CM

## 2013-11-08 ENCOUNTER — Ambulatory Visit: Payer: Medicare Other | Admitting: *Deleted

## 2013-11-14 ENCOUNTER — Ambulatory Visit: Payer: Medicare Other | Admitting: *Deleted

## 2013-12-28 ENCOUNTER — Encounter: Payer: Medicare Other | Attending: Geriatric Medicine | Admitting: Dietician

## 2013-12-28 ENCOUNTER — Encounter: Payer: Self-pay | Admitting: Dietician

## 2013-12-28 VITALS — Ht 62.0 in | Wt 204.4 lb

## 2013-12-28 DIAGNOSIS — E669 Obesity, unspecified: Secondary | ICD-10-CM | POA: Insufficient documentation

## 2013-12-28 DIAGNOSIS — R635 Abnormal weight gain: Secondary | ICD-10-CM | POA: Insufficient documentation

## 2013-12-28 DIAGNOSIS — K589 Irritable bowel syndrome without diarrhea: Secondary | ICD-10-CM | POA: Insufficient documentation

## 2013-12-28 DIAGNOSIS — Z713 Dietary counseling and surveillance: Secondary | ICD-10-CM | POA: Insufficient documentation

## 2013-12-28 DIAGNOSIS — E739 Lactose intolerance, unspecified: Secondary | ICD-10-CM | POA: Insufficient documentation

## 2013-12-28 DIAGNOSIS — Z6837 Body mass index (BMI) 37.0-37.9, adult: Secondary | ICD-10-CM | POA: Insufficient documentation

## 2013-12-28 NOTE — Progress Notes (Signed)
  Medical Nutrition Therapy:  Appt start time: 1500 end time:  1600.   Assessment:  Primary concerns today: [place] is here today for help with weight loss and is trying to get her Hgb A1c down which was 6.3% in December. Would like lose 15 to 20 lbs. Tried to gain weight in 1990 and then ended up gaining too much weight since she started "binging". Goes out to eat to eat about 2 x week ([place]).   Lost about 22 lbs in the past year. Has lactose intolerance and IBS.  Testing blood sugar sporadically and averaging higher for her (150 mg/dl).   Preferred Learning Style:   No preference indicated   Learning Readiness: metformin  Not ready  Contemplating  Ready  Change in progress  MEDICATIONS: Metformin   DIETARY INTAKE:  24-hr recall:  B ( AM): cereal with protein with Lactaid milk or chocolate shake (Ensure)  with sandwich  Snk ( AM): sometimes will have small fries and fruit  L ( PM): hamburger or Japanese chicken and shrimp or rice and vegetables  Snk ( PM): none D ( PM): eats "light" salad or chicken wings and slaw and a beer  Snk ( PM): apple and bar Beverages: water  Usual physical activity: 3 x week 45 minute Silver Sneakers workout with cardio and weights  Estimated energy needs: 1600 calories 180 g carbohydrates 120 g protein 44 g fat  Progress Towards Goal(s):  In progress.   Nutritional Diagnosis:  [place]-3.3 Overweight/obesity As related to hx of excess carbohydrate consumption.  As evidenced by BMI of 37.4 and Hgb A1c of 6.3%.    Intervention:  Nutrition counseling provided. Discussed the pathophysiology of insulin resistance and factors that affect blood glucose such as carbohydrate foods, exercise, weight loss, and stress management.   Goals:  Follow Diabetes Meal Plan as instructed  Eat 3 meals and 2 snacks, every 3-5 hrs  Limit carbohydrate intake to 30-45 grams carbohydrate/meal  Limit carbohydrate intake to 0-15 grams  carbohydrate/snack  Add lean protein foods to meals/snacks  Monitor glucose levels as instructed by your doctor  Aim for 30 mins of physical activity daily  Teaching Method Utilized:  Visual Auditory Hands on  Handouts given during visit include:  Living Well With Diabetes  Yellow Card  MyPlate Handout  15 g CHO Snacks  Barriers to learning/adherence to lifestyle change: none  Demonstrated degree of understanding via:  Teach Back   Monitoring/Evaluation:  Dietary intake, exercise, and body weight prn.

## 2013-12-28 NOTE — Patient Instructions (Signed)
Goals:  Follow Diabetes Meal Plan as instructed  Eat 3 meals and 2 snacks, every 3-5 hrs  Limit carbohydrate intake to 30-45 grams carbohydrate/meal  Limit carbohydrate intake to 0-15 grams carbohydrate/snack  Add lean protein foods to meals/snacks  Monitor glucose levels as instructed by your doctor  Aim for 30 mins of physical activity daily

## 2014-02-08 ENCOUNTER — Other Ambulatory Visit: Payer: Self-pay

## 2014-02-08 DIAGNOSIS — Z1231 Encounter for screening mammogram for malignant neoplasm of breast: Secondary | ICD-10-CM

## 2014-02-19 ENCOUNTER — Ambulatory Visit
Admission: RE | Admit: 2014-02-19 | Discharge: 2014-02-19 | Disposition: A | Discharge status: Home / Self Care | Payer: Medicare Other | Source: Ambulatory Visit

## 2014-02-19 ENCOUNTER — Encounter (INDEPENDENT_AMBULATORY_CARE_PROVIDER_SITE_OTHER): Payer: Self-pay

## 2014-02-19 DIAGNOSIS — Z1231 Encounter for screening mammogram for malignant neoplasm of breast: Secondary | ICD-10-CM

## 2014-05-23 ENCOUNTER — Telehealth: Payer: Self-pay | Admitting: Gastroenterology

## 2014-05-23 NOTE — Telephone Encounter (Signed)
Left a message for Taylor Edwards call back

## 2014-05-23 NOTE — Telephone Encounter (Signed)
Spoke with Margreta Journey at Dr. Carlyle Lipa office. She states patient was seen today. She will fax OV notes when completed by MD. He did not feel this was an urgent/stat OV. Scheduled with Alonza Bogus, PA on 05/25/14 at 9:30 AM.

## 2014-05-24 ENCOUNTER — Telehealth: Payer: Self-pay | Admitting: Internal Medicine

## 2014-05-24 ENCOUNTER — Other Ambulatory Visit: Payer: Self-pay | Admitting: Geriatric Medicine

## 2014-05-24 DIAGNOSIS — K920 Hematemesis: Secondary | ICD-10-CM

## 2014-05-24 NOTE — Telephone Encounter (Signed)
Rec'd from [place] @ [facility] Arabian forward 10 pages to [employer]

## 2014-05-25 ENCOUNTER — Ambulatory Visit: Payer: Medicare Other | Admitting: Gastroenterology

## 2014-05-30 ENCOUNTER — Ambulatory Visit
Admission: RE | Admit: 2014-05-30 | Discharge: 2014-05-30 | Disposition: A | Discharge status: Home / Self Care | Payer: Medicare Other | Source: Ambulatory Visit | Attending: Geriatric Medicine | Admitting: Geriatric Medicine

## 2014-05-30 DIAGNOSIS — K920 Hematemesis: Secondary | ICD-10-CM

## 2014-09-21 HISTORY — PX: CATARACT EXTRACTION: SUR2

## 2014-11-14 ENCOUNTER — Emergency Department (HOSPITAL_COMMUNITY)
Admission: EM | Admit: 2014-11-14 | Discharge: 2014-11-14 | Disposition: A | Discharge status: Home / Self Care | Payer: Medicare Other | Attending: Emergency Medicine | Admitting: Emergency Medicine

## 2014-11-14 ENCOUNTER — Emergency Department (HOSPITAL_COMMUNITY): Payer: Medicare Other

## 2014-11-14 DIAGNOSIS — S8992XA Unspecified injury of left lower leg, initial encounter: Secondary | ICD-10-CM | POA: Diagnosis not present

## 2014-11-14 DIAGNOSIS — W010XXA Fall on same level from slipping, tripping and stumbling without subsequent striking against object, initial encounter: Secondary | ICD-10-CM | POA: Diagnosis not present

## 2014-11-14 DIAGNOSIS — Y9289 Other specified places as the place of occurrence of the external cause: Secondary | ICD-10-CM | POA: Diagnosis not present

## 2014-11-14 DIAGNOSIS — H269 Unspecified cataract: Secondary | ICD-10-CM | POA: Insufficient documentation

## 2014-11-14 DIAGNOSIS — Y998 Other external cause status: Secondary | ICD-10-CM | POA: Insufficient documentation

## 2014-11-14 DIAGNOSIS — S8991XA Unspecified injury of right lower leg, initial encounter: Secondary | ICD-10-CM | POA: Insufficient documentation

## 2014-11-14 DIAGNOSIS — S6292XA Unspecified fracture of left wrist and hand, initial encounter for closed fracture: Secondary | ICD-10-CM | POA: Diagnosis not present

## 2014-11-14 DIAGNOSIS — S6992XA Unspecified injury of left wrist, hand and finger(s), initial encounter: Secondary | ICD-10-CM | POA: Diagnosis present

## 2014-11-14 DIAGNOSIS — Z862 Personal history of diseases of the blood and blood-forming organs and certain disorders involving the immune mechanism: Secondary | ICD-10-CM | POA: Diagnosis not present

## 2014-11-14 DIAGNOSIS — Y9389 Activity, other specified: Secondary | ICD-10-CM | POA: Diagnosis not present

## 2014-11-14 DIAGNOSIS — J45909 Unspecified asthma, uncomplicated: Secondary | ICD-10-CM | POA: Diagnosis not present

## 2014-11-14 DIAGNOSIS — Z8719 Personal history of other diseases of the digestive system: Secondary | ICD-10-CM | POA: Insufficient documentation

## 2014-11-14 DIAGNOSIS — Z79899 Other long term (current) drug therapy: Secondary | ICD-10-CM | POA: Diagnosis not present

## 2014-11-14 DIAGNOSIS — I10 Essential (primary) hypertension: Secondary | ICD-10-CM | POA: Diagnosis not present

## 2014-11-14 DIAGNOSIS — E11649 Type 2 diabetes mellitus with hypoglycemia without coma: Secondary | ICD-10-CM | POA: Insufficient documentation

## 2014-11-14 DIAGNOSIS — E162 Hypoglycemia, unspecified: Secondary | ICD-10-CM

## 2014-11-14 DIAGNOSIS — S62102A Fracture of unspecified carpal bone, left wrist, initial encounter for closed fracture: Secondary | ICD-10-CM

## 2014-11-14 LAB — I-STAT CHEM 8, ED
BUN: 16 mg/dL (ref 6–23)
Calcium, Ion: 1.18 mmol/L (ref 1.13–1.30)
Chloride: 104 mmol/L (ref 96–112)
Creatinine, Ser: 0.9 mg/dL (ref 0.50–1.10)
Glucose, Bld: 167 mg/dL — ABNORMAL HIGH (ref 70–99)
HCT: 36 % (ref 36.0–46.0)
Hemoglobin: 12.2 g/dL (ref 12.0–15.0)
Potassium: 3.6 mmol/L (ref 3.5–5.1)
Sodium: 142 mmol/L (ref 135–145)
TCO2: 24 mmol/L (ref 0–100)

## 2014-11-14 LAB — CBG MONITORING, ED: Glucose-Capillary: 194 mg/dL — ABNORMAL HIGH (ref 70–99)

## 2014-11-14 MED ORDER — OXYCODONE-ACETAMINOPHEN 5-325 MG PO TABS: 1.0000 | ORAL_TABLET | Freq: Four times a day (QID) | ORAL | Status: DC | PRN

## 2014-11-14 MED ORDER — OXYCODONE-ACETAMINOPHEN 5-325 MG PO TABS
1.0000 | ORAL_TABLET | Freq: Once | ORAL | Status: AC
  Administered 2014-11-14: 1 via ORAL
  Filled 2014-11-14: qty 1

## 2014-11-14 NOTE — ED Notes (Signed)
Pt states she tripped over a defect in the floor just prior to arrival causing her to fall and injure her left wrist. Per EMS Pt was FSBG was 24 mg/dL and she was given 1/2 amp D50 IV.

## 2014-11-14 NOTE — ED Provider Notes (Signed)
CSN: [id number]     Arrival date & time 11/14/14  1332 History   First MD Initiated Contact with Patient 11/14/14 1508     Chief Complaint  Patient presents with  . Fall  . Hypoglycemia     (Consider location/radiation/quality/duration/timing/severity/associated sxs/prior Treatment) HPI Pt is a 74yo female with hx of type II DM on metformin and "natural supplements, asthma, arthritis of both knees, anemia, and HTN, brought to ED by EMS with reports of left wrist injury.  Pt reports tripping of a defect in the floor just prior to arrival causing her to fall injuring her left wrist and both knees.  Per EMS, FSBG was 24mg /dL.  She was given 1/2 amp D50 IV and a Coke.  Upon arrival to ED, CBG was 194.  Pt denies feeling lightheaded, dizzy, passing out or headache. States she took 1000mg  of metformin and did not eat as much as she should have. Pt states she feels well besides her left wrist that is constantly aching, sore, throbbing, 5/10 at this time.  Pain was severe but improved to 3/10 after wrist was placed in a splint and pt given fentanyl by EMS PTA.    Orthopedist: pt was seen by Dr. Rhona Raider, [place] for bilateral knee surgery.   Past Medical History  Diagnosis Date  . Anemia     no current med.  . Asthma     no current meds.  . Arthritis     knees  . GERD (gastroesophageal reflux disease)     no current med.  . Sickle cell trait   . Post-nasal drip     current cough  . Urinary urgency   . Diabetes mellitus     only taking natural supplements  . Cataract of both eyes     early  . Hypertension     has been on med. x 10 yrs.  . IBS (irritable bowel syndrome)    Past Surgical History  Procedure Laterality Date  . Knee arthroscopy  12/11/2008    bilat., with chondroplasty  . Knee joint manipulation  10/30/2009    bilat.  . Total knee arthroplasty  09/26/2009    bilat.  . Joint replacement    . Bunionectomy  08/25/2011    Procedure: [name];  Surgeon: Hessie Dibble, MD;  Location: [facility];  Service: Orthopedics;  Laterality: Right;  . Injection knee  08/25/2011    Procedure: KNEE INJECTION;  Surgeon: Hessie Dibble, MD;  Location: [facility];  Service: Orthopedics;  Laterality: Bilateral;   Family History  Problem Relation Age of Onset  . Colon cancer Neg Hx   . Stomach cancer Neg Hx   . Diabetes Other    History  Substance Use Topics  . Smoking status: Never Smoker   . Smokeless tobacco: Never Used  . Alcohol Use: 2.4 oz/week    4 Glasses of wine per week     Comment: wkends.   OB History    No data available     Review of Systems  Constitutional: Negative for fever and chills.  Musculoskeletal: Positive for myalgias, joint swelling and arthralgias. Negative for back pain and neck pain.       Left wrist  Neurological: Negative for dizziness, weakness, light-headedness, numbness and headaches.  All other systems reviewed and are negative.     Allergies  Morphine and related; Anaprox; Aspirin; and Sulfa antibiotics  Home Medications   Prior to Admission medications  Medication Sig Start Date End Date Taking? Authorizing Provider  Ascorbic Acid (VITAMIN C) 100 MG tablet Take 100 mg by mouth daily.     Yes Historical Provider, MD  atorvastatin (LIPITOR) 10 MG tablet Take 10 mg by mouth daily.   Yes Historical Provider, MD  docusate sodium (COLACE) 100 MG capsule Take 200 mg by mouth at bedtime.    Yes Historical Provider, MD  Garlic 176 MG CAPS Take by mouth daily.   Yes Historical Provider, MD  hydrochlorothiazide (HYDRODIURIL) 25 MG tablet Take 25 mg by mouth daily. AM    Yes Historical Provider, MD  lisinopril (PRINIVIL,ZESTRIL) 10 MG tablet Take 10 mg by mouth daily. AM    Yes Historical Provider, MD  metFORMIN (GLUCOPHAGE) 500 MG tablet Take 500 mg by mouth 2 (two) times daily with a meal.   Yes Historical Provider, MD  multivitamin (THERAGRAN) per tablet Take 1 tablet by mouth daily.      Yes Historical Provider, MD  Sennosides-Docusate Sodium (SENOKOT S PO) Take 2 tablets by mouth at bedtime.    Yes Historical Provider, MD  UNABLE TO FIND Take 2 tablets by mouth at bedtime.    Yes Historical Provider, MD  ibuprofen (ADVIL,MOTRIN) 400 MG tablet Take 400 mg by mouth every 6 (six) hours as needed.    Historical Provider, MD  oxyCODONE-acetaminophen (PERCOCET/ROXICET) 5-325 MG per tablet Take 1 tablet by mouth every 6 (six) hours as needed. For pain. 11/14/14   Noland Fordyce, PA-C   BP 140/66 mmHg  Pulse 80  Temp(Src) 98.5 F (36.9 C) (Oral)  Resp 17  Ht 5\' 2"  (1.575 m)  Wt 198 lb (89.812 kg)  BMI 36.21 kg/m2  SpO2 99% Physical Exam  Constitutional: She is oriented to person, place, and time. She appears well-developed and well-nourished.  Pt sitting on stretcher talking on the phone making a hairdresser appointment. NAD.  Ice packs on both knees. Wrist splint on left wrist.  HENT:  Head: Normocephalic and atraumatic.  Eyes: EOM are normal. Pupils are equal, round, and reactive to light.  Neck: Normal range of motion. Neck supple.  Cardiovascular: Normal rate.   Pulses:      Radial pulses are 2+ on the left side.       Dorsalis pedis pulses are 2+ on the right side, and 2+ on the left side.  Left hand: cap refill <3 seconds   Pulmonary/Chest: Effort normal.  Musculoskeletal: She exhibits edema and tenderness.  Left wrist: mild deformity with moderate edema to dorsal aspect of wrist. Diffuse tenderness, worst on dorsal aspect. Limited ROM due to severe pain.  FROM all 5 fingers. Left elbow and shoulder: FROM, non-tender.  Bilateral knees: mild tenderness, FROM. No edema or deformity.  Neurological: She is alert and oriented to person, place, and time.  Skin: Skin is warm and dry.  Psychiatric: She has a normal mood and affect. Her behavior is normal.  Nursing note and vitals reviewed.   ED Course  Procedures (including critical care time) Labs Review Labs  Reviewed  CBG MONITORING, ED - Abnormal; Notable for the following:    Glucose-Capillary 194 (*)    All other components within normal limits  I-STAT CHEM 8, ED - Abnormal; Notable for the following:    Glucose, Bld 167 (*)    All other components within normal limits    Imaging Review Dg Wrist Complete Left  11/14/2014   CLINICAL DATA:  Pain following fall  EXAM: LEFT WRIST - COMPLETE 3+  VIEW  COMPARISON:  None.  FINDINGS: Frontal, oblique, lateral, and ulnar deviation scaphoid images were obtained. There is a comminuted fracture of the distal radial metaphysis with fragments extending into the radiocarpal joint. There is volar angulation distally. There is avulsion of the ulnar styloid. No dislocation. There is osteoarthritic change in the saddle joint.  IMPRESSION: Comminuted fracture distal radial metaphysis with fragments extending into the radiocarpal joint. Volar angulation distally. Several displaced fracture fragments are noted within this region. Avulsion ulnar styloid. No dislocation.   Electronically Signed   By: Lowella Grip III M.D.   On: 11/14/2014 16:29     EKG Interpretation None      MDM   Final diagnoses:  Fall from slip, trip, or stumble, initial encounter  Left wrist fracture, closed, initial encounter  Hypoglycemia    Pt is a 74yo female presenting to ED with left wrist pain after she tripped and fell. Pt reports a mechanical fall, however, per EMS, CBG was 24 upon their arrival.  CBG improved to 194 after she was given 1/2 amp of D50 IV PTA.   istat Chem-8: unremarkable. Pt denies HA, dizziness, nausea or any other symptoms besides left wrist pain. Left hand is neurovascularly in tact.   Pt has been seen by Dr. Rhona Raider with [facility] and requests to f/u with the same practice. Discussed pt with Dr. Maryan Rued who also examined pt.  Agrees pt may be discharged home with wrist splint and sling as hand is neurovascularly in tact, however, strongly  encouraged pt to call [facility] in the morning to schedule an appointment. Return precautions provided. Pt verbalized understanding and agreement with tx plan.     Noland Fordyce, PA-C 11/14/14 [place], MD 11/16/14 [phone]

## 2014-11-14 NOTE — ED Notes (Signed)
Bed: WA23 Expected date:  Expected time:  Means of arrival:  Comments: [facility] 

## 2014-11-14 NOTE — Progress Notes (Signed)
CSW met with pt at bedside. There was no family present. However, pt states that she has a good support system which consist of her daughter in [place] and her friends in [place].  Pt confirms that she presents to [facility] today due to tripping and falling. She states that she fell on the sidewalk on [address]. Pt stated " The side walk had an elevation and I stumbled."  Pt states that she has not had any other falls in the past 6 months. Pt confirms that she injured her left wrist today. Pt states that she is able to complete all ADL's independently. Pt states that she stays at home alone. Patient states that she still drives. Also, pt informed CSW that she works for [employer].   CSW asked pt if she was interested in going to a facility. The pt stated that she was not interested. Pt states that she is able to ambulate well. Also, she informed CSW that she has had 2 knee replacements in the past.  CSW asked pt if she has any questions. However, pt stated that she does not have any at this time.  Willette Brace [phone] ED CSW 11/14/2014 8:12 PM

## 2014-11-14 NOTE — ED Notes (Signed)
Bed: [facility] Expected date:  Expected time:  Means of arrival:  Comments: EMS- fall, wrist injury

## 2014-11-16 ENCOUNTER — Other Ambulatory Visit: Payer: Self-pay | Admitting: Orthopedic Surgery

## 2014-11-16 ENCOUNTER — Encounter (HOSPITAL_COMMUNITY): Payer: Self-pay | Admitting: *Deleted

## 2014-11-16 MED ORDER — CEFAZOLIN SODIUM-DEXTROSE 2-3 GM-% IV SOLR
2.0000 g | INTRAVENOUS | Status: AC
  Administered 2014-11-17: 2 g via INTRAVENOUS
  Filled 2014-11-16: qty 50

## 2014-11-16 MED ORDER — CHLORHEXIDINE GLUCONATE 4 % EX LIQD
60.0000 mL | Freq: Once | CUTANEOUS | Status: DC
  Filled 2014-11-16: qty 60

## 2014-11-16 NOTE — Progress Notes (Signed)
Pt denies SOB, chest pain, and being under the care of a cardiologist. Pt denies having a chest x ray and EKG within the last year. Pt denies having a stress test, echo and cardiac cath. Pt made aware not to take any diabetic medications the morning of procedure (metFORMIN (GLUCOPHAGE). Pt made aware to stop taking Aspirin, otc vitamins, and herbal medications ( Garlic and Fish oil) . Do not take any NSAIDs ie: Ibuprofen, Advil, Naproxen or any medication containing Aspirin. Pt verbalized understanding of pre-op instructions.

## 2014-11-17 ENCOUNTER — Encounter (HOSPITAL_COMMUNITY)
Admission: RE | Disposition: A | Discharge status: Home / Self Care | Payer: Self-pay | Source: Ambulatory Visit | Attending: Orthopedic Surgery

## 2014-11-17 ENCOUNTER — Ambulatory Visit (HOSPITAL_COMMUNITY): Payer: Medicare Other | Admitting: Certified Registered Nurse Anesthetist

## 2014-11-17 ENCOUNTER — Encounter (HOSPITAL_COMMUNITY): Payer: Self-pay | Admitting: *Deleted

## 2014-11-17 ENCOUNTER — Ambulatory Visit (HOSPITAL_COMMUNITY)
Admission: RE | Admit: 2014-11-17 | Discharge: 2014-11-17 | Disposition: A | Discharge status: Home / Self Care | Payer: Medicare Other | Source: Ambulatory Visit | Attending: Orthopedic Surgery | Admitting: Orthopedic Surgery

## 2014-11-17 DIAGNOSIS — J45909 Unspecified asthma, uncomplicated: Secondary | ICD-10-CM | POA: Insufficient documentation

## 2014-11-17 DIAGNOSIS — E119 Type 2 diabetes mellitus without complications: Secondary | ICD-10-CM | POA: Diagnosis not present

## 2014-11-17 DIAGNOSIS — M1990 Unspecified osteoarthritis, unspecified site: Secondary | ICD-10-CM | POA: Insufficient documentation

## 2014-11-17 DIAGNOSIS — S52572A Other intraarticular fracture of lower end of left radius, initial encounter for closed fracture: Secondary | ICD-10-CM | POA: Diagnosis not present

## 2014-11-17 DIAGNOSIS — Z87891 Personal history of nicotine dependence: Secondary | ICD-10-CM | POA: Insufficient documentation

## 2014-11-17 DIAGNOSIS — K589 Irritable bowel syndrome without diarrhea: Secondary | ICD-10-CM | POA: Diagnosis not present

## 2014-11-17 DIAGNOSIS — Y929 Unspecified place or not applicable: Secondary | ICD-10-CM | POA: Diagnosis not present

## 2014-11-17 DIAGNOSIS — Z79899 Other long term (current) drug therapy: Secondary | ICD-10-CM | POA: Diagnosis not present

## 2014-11-17 DIAGNOSIS — Z8249 Family history of ischemic heart disease and other diseases of the circulatory system: Secondary | ICD-10-CM | POA: Insufficient documentation

## 2014-11-17 DIAGNOSIS — Z6836 Body mass index (BMI) 36.0-36.9, adult: Secondary | ICD-10-CM | POA: Diagnosis not present

## 2014-11-17 DIAGNOSIS — Z791 Long term (current) use of non-steroidal anti-inflammatories (NSAID): Secondary | ICD-10-CM | POA: Insufficient documentation

## 2014-11-17 DIAGNOSIS — X58XXXA Exposure to other specified factors, initial encounter: Secondary | ICD-10-CM | POA: Insufficient documentation

## 2014-11-17 DIAGNOSIS — Z833 Family history of diabetes mellitus: Secondary | ICD-10-CM | POA: Diagnosis not present

## 2014-11-17 DIAGNOSIS — M25532 Pain in left wrist: Secondary | ICD-10-CM | POA: Diagnosis present

## 2014-11-17 DIAGNOSIS — Y939 Activity, unspecified: Secondary | ICD-10-CM | POA: Diagnosis not present

## 2014-11-17 DIAGNOSIS — K219 Gastro-esophageal reflux disease without esophagitis: Secondary | ICD-10-CM | POA: Insufficient documentation

## 2014-11-17 DIAGNOSIS — Y999 Unspecified external cause status: Secondary | ICD-10-CM | POA: Insufficient documentation

## 2014-11-17 DIAGNOSIS — I10 Essential (primary) hypertension: Secondary | ICD-10-CM | POA: Insufficient documentation

## 2014-11-17 HISTORY — PX: OPEN REDUCTION INTERNAL FIXATION (ORIF) DISTAL RADIAL FRACTURE: SHX5989

## 2014-11-17 LAB — CBC
HCT: 32.7 % — ABNORMAL LOW (ref 36.0–46.0)
Hemoglobin: 10.7 g/dL — ABNORMAL LOW (ref 12.0–15.0)
MCH: 27.4 pg (ref 26.0–34.0)
MCHC: 32.7 g/dL (ref 30.0–36.0)
MCV: 83.8 fL (ref 78.0–100.0)
Platelets: 258 10*3/uL (ref 150–400)
RBC: 3.9 MIL/uL (ref 3.87–5.11)
RDW: 13 % (ref 11.5–15.5)
WBC: 8.4 10*3/uL (ref 4.0–10.5)

## 2014-11-17 LAB — GLUCOSE, CAPILLARY
Glucose-Capillary: 121 mg/dL — ABNORMAL HIGH (ref 70–99)
Glucose-Capillary: 107 mg/dL — ABNORMAL HIGH (ref 70–99)

## 2014-11-17 LAB — BASIC METABOLIC PANEL
Anion gap: 7 (ref 5–15)
BUN: 12 mg/dL (ref 6–23)
CO2: 30 mmol/L (ref 19–32)
Calcium: 8.8 mg/dL (ref 8.4–10.5)
Chloride: 104 mmol/L (ref 96–112)
Creatinine, Ser: 0.94 mg/dL (ref 0.50–1.10)
GFR calc Af Amer: 69 mL/min — ABNORMAL LOW (ref >90)
GFR calc non Af Amer: 60 mL/min — ABNORMAL LOW (ref >90)
Glucose, Bld: 117 mg/dL — ABNORMAL HIGH (ref 70–99)
Potassium: 3.5 mmol/L (ref 3.5–5.1)
Sodium: 141 mmol/L (ref 135–145)

## 2014-11-17 SURGERY — OPEN REDUCTION INTERNAL FIXATION (ORIF) DISTAL RADIUS FRACTURE
Anesthesia: General | Site: Wrist | Laterality: Left

## 2014-11-17 MED ORDER — MIDAZOLAM HCL 5 MG/5ML IJ SOLN
INTRAMUSCULAR | Status: DC | PRN
  Administered 2014-11-17: 2 mg via INTRAVENOUS

## 2014-11-17 MED ORDER — LIDOCAINE HCL (CARDIAC) 20 MG/ML IV SOLN
INTRAVENOUS | Status: DC | PRN
  Administered 2014-11-17: 50 mg via INTRAVENOUS

## 2014-11-17 MED ORDER — ROCURONIUM BROMIDE 50 MG/5ML IV SOLN
INTRAVENOUS | Status: AC
  Filled 2014-11-17: qty 1

## 2014-11-17 MED ORDER — PROPOFOL 10 MG/ML IV BOLUS
INTRAVENOUS | Status: AC
  Filled 2014-11-17: qty 20

## 2014-11-17 MED ORDER — FENTANYL CITRATE 0.05 MG/ML IJ SOLN
INTRAMUSCULAR | Status: DC | PRN
  Administered 2014-11-17 (×3): 50 ug via INTRAVENOUS
  Administered 2014-11-17: 100 ug via INTRAVENOUS

## 2014-11-17 MED ORDER — OXYCODONE-ACETAMINOPHEN 5-325 MG PO TABS: 1.0000 | ORAL_TABLET | ORAL | Status: DC | PRN

## 2014-11-17 MED ORDER — LACTATED RINGERS IV SOLN
INTRAVENOUS | Status: DC
  Administered 2014-11-17 (×3): via INTRAVENOUS

## 2014-11-17 MED ORDER — PROPOFOL 10 MG/ML IV BOLUS
INTRAVENOUS | Status: DC | PRN
  Administered 2014-11-17: 200 mg via INTRAVENOUS

## 2014-11-17 MED ORDER — BUPIVACAINE HCL (PF) 0.25 % IJ SOLN
INTRAMUSCULAR | Status: DC | PRN
  Administered 2014-11-17: 10 mL

## 2014-11-17 MED ORDER — BUPIVACAINE HCL (PF) 0.25 % IJ SOLN
INTRAMUSCULAR | Status: AC
  Filled 2014-11-17: qty 30

## 2014-11-17 MED ORDER — ONDANSETRON HCL 4 MG/2ML IJ SOLN
INTRAMUSCULAR | Status: DC | PRN
Start: 2014-11-17 — End: 2014-11-17
  Administered 2014-11-17: 4 mg via INTRAVENOUS

## 2014-11-17 MED ORDER — PHENYLEPHRINE HCL 10 MG/ML IJ SOLN
INTRAMUSCULAR | Status: DC | PRN
  Administered 2014-11-17: 80 ug via INTRAVENOUS

## 2014-11-17 MED ORDER — FENTANYL CITRATE 0.05 MG/ML IJ SOLN
INTRAMUSCULAR | Status: AC
  Filled 2014-11-17: qty 5

## 2014-11-17 MED ORDER — BUPIVACAINE-EPINEPHRINE (PF) 0.5% -1:200000 IJ SOLN
INTRAMUSCULAR | Status: DC | PRN
  Administered 2014-11-17: 30 mL via PERINEURAL

## 2014-11-17 MED ORDER — MIDAZOLAM HCL 2 MG/2ML IJ SOLN
INTRAMUSCULAR | Status: AC
  Filled 2014-11-17: qty 2

## 2014-11-17 SURGICAL SUPPLY — 61 items
BANDAGE ELASTIC 3 VELCRO ST LF (GAUZE/BANDAGES/DRESSINGS) ×2 IMPLANT
BANDAGE ELASTIC 4 VELCRO ST LF (GAUZE/BANDAGES/DRESSINGS) ×2 IMPLANT
BIT DRILL 2 FAST STEP (BIT) ×2 IMPLANT
BIT DRILL 2.5X4 QC (BIT) ×2 IMPLANT
BNDG CMPR 9X4 STRL LF SNTH (GAUZE/BANDAGES/DRESSINGS) ×1
BNDG ESMARK 4X9 LF (GAUZE/BANDAGES/DRESSINGS) ×2 IMPLANT
BNDG GAUZE ELAST 4 BULKY (GAUZE/BANDAGES/DRESSINGS) ×2 IMPLANT
CORDS BIPOLAR (ELECTRODE) IMPLANT
COVER SURGICAL LIGHT HANDLE (MISCELLANEOUS) ×2 IMPLANT
CUFF TOURNIQUET SINGLE 18IN (TOURNIQUET CUFF) ×2 IMPLANT
CUFF TOURNIQUET SINGLE 24IN (TOURNIQUET CUFF) IMPLANT
DRAPE C-ARM MINI 42X72 WSTRAPS (DRAPES) ×2 IMPLANT
DRAPE OEC MINIVIEW 54X84 (DRAPES) IMPLANT
DRAPE SURG 17X23 STRL (DRAPES) ×2 IMPLANT
DURAPREP 26ML APPLICATOR (WOUND CARE) ×2 IMPLANT
ELECT REM PT RETURN 9FT ADLT (ELECTROSURGICAL)
ELECTRODE REM PT RTRN 9FT ADLT (ELECTROSURGICAL) IMPLANT
GAUZE SPONGE 4X4 12PLY STRL (GAUZE/BANDAGES/DRESSINGS) ×2 IMPLANT
GAUZE XEROFORM 1X8 LF (GAUZE/BANDAGES/DRESSINGS) ×2 IMPLANT
GLOVE SURG SYN 8.0 (GLOVE) ×2 IMPLANT
GOWN STRL REUS W/ TWL LRG LVL3 (GOWN DISPOSABLE) ×1 IMPLANT
GOWN STRL REUS W/ TWL XL LVL3 (GOWN DISPOSABLE) ×1 IMPLANT
GOWN STRL REUS W/TWL LRG LVL3 (GOWN DISPOSABLE) ×2
GOWN STRL REUS W/TWL XL LVL3 (GOWN DISPOSABLE) ×2
K-WIRE 1.6 (WIRE) ×2
K-WIRE FX5X1.6XNS BN SS (WIRE) ×1
KIT BASIN OR (CUSTOM PROCEDURE TRAY) ×2 IMPLANT
KIT ROOM TURNOVER OR (KITS) ×2 IMPLANT
KWIRE FX5X1.6XNS BN SS (WIRE) ×1 IMPLANT
MANIFOLD NEPTUNE II (INSTRUMENTS) ×2 IMPLANT
NEEDLE HYPO 25GX1X1/2 BEV (NEEDLE) IMPLANT
NS IRRIG 1000ML POUR BTL (IV SOLUTION) ×2 IMPLANT
PACK ORTHO EXTREMITY (CUSTOM PROCEDURE TRAY) ×2 IMPLANT
PAD ARMBOARD 7.5X6 YLW CONV (MISCELLANEOUS) ×4 IMPLANT
PAD CAST 3X4 CTTN HI CHSV (CAST SUPPLIES) ×1 IMPLANT
PAD CAST 4YDX4 CTTN HI CHSV (CAST SUPPLIES) ×2 IMPLANT
PADDING CAST COTTON 3X4 STRL (CAST SUPPLIES) ×2
PADDING CAST COTTON 4X4 STRL (CAST SUPPLIES) ×4
PEG SUBCHONDRAL SMOOTH 2.0X20 (Peg) ×8 IMPLANT
PEG SUBCHONDRAL SMOOTH 2.0X22 (Peg) ×6 IMPLANT
PENCIL BUTTON HOLSTER BLD 10FT (ELECTRODE) IMPLANT
PLATE STAN 24.4X59.5 LT (Plate) ×2 IMPLANT
SCREW BN 12X3.5XNS CORT TI (Screw) ×2 IMPLANT
SCREW BN 13X3.5XNS CORT TI (Screw) ×1 IMPLANT
SCREW CORT 3.5X12 (Screw) ×4 IMPLANT
SCREW CORT 3.5X13 (Screw) ×2 IMPLANT
SPLINT PLASTER EXTRA FAST 3X15 (CAST SUPPLIES) ×1
SPLINT PLASTER GYPS XFAST 3X15 (CAST SUPPLIES) ×1 IMPLANT
SPONGE LAP 4X18 X RAY DECT (DISPOSABLE) IMPLANT
SPONGE SCRUB IODOPHOR (GAUZE/BANDAGES/DRESSINGS) ×2 IMPLANT
STRIP CLOSURE SKIN 1/2X4 (GAUZE/BANDAGES/DRESSINGS) ×2 IMPLANT
SUT PROLENE 3 0 PS 2 (SUTURE) ×2 IMPLANT
SUT VIC AB 2-0 FS1 27 (SUTURE) ×2 IMPLANT
SUT VIC AB 3-0 FS2 27 (SUTURE) ×2 IMPLANT
SUT VICRYL 4-0 PS2 18IN ABS (SUTURE) IMPLANT
SYR CONTROL 10ML LL (SYRINGE) IMPLANT
TOWEL OR 17X24 6PK STRL BLUE (TOWEL DISPOSABLE) ×2 IMPLANT
TOWEL OR 17X26 10 PK STRL BLUE (TOWEL DISPOSABLE) ×2 IMPLANT
TUBE CONNECTING 12X1/4 (SUCTIONS) IMPLANT
UNDERPAD 30X30 INCONTINENT (UNDERPADS AND DIAPERS) ×2 IMPLANT
WATER STERILE IRR 1000ML POUR (IV SOLUTION) ×2 IMPLANT

## 2014-11-17 NOTE — Op Note (Signed)
See note [id number]

## 2014-11-17 NOTE — H&P (Signed)
Taylor Edwards is an 74 y.o. female.   Chief Complaint: left wrist pain and deformity HPI: as above s/p fall with displaced left distal radius fracture  Past Medical History  Diagnosis Date  . Anemia     no current med.  . Asthma     no current meds.  . Arthritis     knees  . GERD (gastroesophageal reflux disease)     no current med.  . Sickle cell trait   . Post-nasal drip     current cough  . Urinary urgency   . Diabetes mellitus     only taking natural supplements  . Cataract of both eyes     early  . Hypertension     has been on med. x 10 yrs.  . IBS (irritable bowel syndrome)     Past Surgical History  Procedure Laterality Date  . Knee arthroscopy  12/11/2008    bilat., with chondroplasty  . Knee joint manipulation  10/30/2009    bilat.  . Total knee arthroplasty  09/26/2009    bilat.  . Joint replacement    . Bunionectomy  08/25/2011    Procedure: [name];  Surgeon: Hessie Dibble, MD;  Location: [facility];  Service: Orthopedics;  Laterality: Right;  . Injection knee  08/25/2011    Procedure: KNEE INJECTION;  Surgeon: Hessie Dibble, MD;  Location: [facility];  Service: Orthopedics;  Laterality: Bilateral;  . Cataract extraction      both eyes  . Colonoscopy w/ biopsies and polypectomy    . Dilation and curettage of uterus    . Abdominal hysterectomy      Family History  Problem Relation Age of Onset  . Colon cancer Neg Hx   . Stomach cancer Neg Hx   . Diabetes Other   . Hypertension Other   . Heart disease Mother    Social History:  reports that she quit smoking about 29 years ago. Her smoking use included Cigarettes. She has never used smokeless tobacco. She reports that she drinks about 2.4 oz of alcohol per week. She reports that she does not use illicit drugs.  Allergies:  Allergies  Allergen Reactions  . Morphine And Related Other (See Comments)    Burns skin, hair falls out  . Anaprox [Naproxen Sodium] Other  (See Comments)    Leg cramps  . Aspirin     nausea  . Sulfa Antibiotics Other (See Comments)    Leg cramps    Medications Prior to Admission  Medication Sig Dispense Refill  . Ascorbic Acid (VITAMIN C) 100 MG tablet Take 100 mg by mouth daily.      Marland Kitchen atorvastatin (LIPITOR) 10 MG tablet Take 10 mg by mouth daily.    Marland Kitchen docusate sodium (COLACE) 100 MG capsule Take 200 mg by mouth at bedtime.     . Garlic 676 MG CAPS Take by mouth daily.    . hydrochlorothiazide (HYDRODIURIL) 25 MG tablet Take 25 mg by mouth daily. AM     . lisinopril (PRINIVIL,ZESTRIL) 10 MG tablet Take 10 mg by mouth daily. AM     . metFORMIN (GLUCOPHAGE) 500 MG tablet Take 500 mg by mouth 2 (two) times daily with a meal.    . multivitamin (THERAGRAN) per tablet Take 1 tablet by mouth daily.      Marland Kitchen oxyCODONE-acetaminophen (PERCOCET/ROXICET) 5-325 MG per tablet Take 1 tablet by mouth every 6 (six) hours as needed. For pain. 15 tablet 0  .  Probiotic Product (PROBIOTIC DAILY PO) Take 2 tablets by mouth at bedtime.    Orlie Dakin Sodium (SENOKOT S PO) Take 2 tablets by mouth at bedtime.     Marland Kitchen ibuprofen (ADVIL,MOTRIN) 400 MG tablet Take 400 mg by mouth every 6 (six) hours as needed.    Marland Kitchen UNABLE TO FIND Take 2 tablets by mouth 2 (two) times daily. Jerene Canny (herbal supplement for urination)      Results for orders placed or performed during the hospital encounter of 11/17/14 (from the past 48 hour(s))  CBC     Status: Abnormal   Collection Time: 11/17/14  8:30 AM  Result Value Ref Range   WBC 8.4 4.0 - 10.5 K/uL   RBC 3.90 3.87 - 5.11 MIL/uL   Hemoglobin 10.7 (L) 12.0 - 15.0 g/dL   HCT 32.7 (L) 36.0 - 46.0 %   MCV 83.8 78.0 - 100.0 fL   MCH 27.4 26.0 - 34.0 pg   MCHC 32.7 30.0 - 36.0 g/dL   RDW 13.0 11.5 - 15.5 %   Platelets 258 150 - 400 K/uL  Glucose, capillary     Status: Abnormal   Collection Time: 11/17/14  8:47 AM  Result Value Ref Range   Glucose-Capillary 121 (H) 70 - 99 mg/dL   No results  found.  Review of Systems  All other systems reviewed and are negative.   Blood pressure 130/68, pulse 78, temperature 98.3 F (36.8 C), temperature source Oral, resp. rate 18, SpO2 98 %. Physical Exam  Constitutional: She is oriented to person, place, and time. She appears well-developed and well-nourished.  HENT:  Head: Normocephalic and atraumatic.  Cardiovascular: Normal rate.   Respiratory: Effort normal.  Musculoskeletal:       Left wrist: She exhibits bony tenderness, swelling and deformity.  Left volarly displaced distal radius fracture  Neurological: She is alert and oriented to person, place, and time.  Skin: Skin is warm.  Psychiatric: She has a normal mood and affect. Her behavior is normal. Judgment and thought content normal.     Assessment/Plan As above   Plan ORIF  Carmesha Morocco A 11/17/2014, 9:36 AM

## 2014-11-17 NOTE — Anesthesia Postprocedure Evaluation (Signed)
  Anesthesia Post-op Note  Patient: Taylor Edwards  Procedure(s) Performed: Procedure(s): OPEN REDUCTION INTERNAL FIXATION (ORIF) LEFT DISTAL RADIUS  FRACTURE (Left)  Patient Location: PACU  Anesthesia Type:GA combined with regional for post-op pain  Level of Consciousness: awake and alert   Airway and Oxygen Therapy: Patient Spontanous Breathing  Post-op Pain: mild  Post-op Assessment: Post-op Vital signs reviewed  Post-op Vital Signs: stable  Last Vitals:  Filed Vitals:   11/17/14 0823  BP: 130/68  Pulse: 78  Temp: 36.8 C  Resp: 18    Complications: No apparent anesthesia complications

## 2014-11-17 NOTE — Anesthesia Procedure Notes (Signed)
Anesthesia Regional Block:  Supraclavicular block  Pre-Anesthetic Checklist: ,, timeout performed, Correct Patient, Correct Site, Correct Laterality, Correct Procedure, Correct Position, site marked, Risks and benefits discussed,  Surgical consent,  Pre-op evaluation,  At surgeon's request and post-op pain management  Laterality: Left and Upper  Prep: chloraprep       Needles:   Needle Type: Echogenic Needle     Needle Length: 9cm 9 cm Needle Gauge: 21 and 21 G  Needle insertion depth: 5 cm   Additional Needles:  Procedures: ultrasound guided (picture in chart) Supraclavicular block Narrative:  Start time: 11/17/2014 9:25 AM End time: 11/17/2014 9:49 AM Injection made incrementally with aspirations every 5 mL.  Performed by: Personally  Anesthesiologist: Treniyah Lynn  Additional Notes: Tolerated well

## 2014-11-17 NOTE — Progress Notes (Signed)
Tolerated full [place] sandwich with crackers and drink.

## 2014-11-17 NOTE — Progress Notes (Signed)
Orthopedic Tech Progress Note Patient Details:  Taylor Edwards [birth year] [id number]  Ortho Devices Type of Ortho Device: Arm sling Ortho Device/Splint Location: LUE Ortho Device/Splint Interventions: Application   Asia R Thompson 11/17/2014, 1:05 PM

## 2014-11-17 NOTE — Op Note (Signed)
[name], Taylor Edwards               ACCOUNT NO.:  [id number]  MEDICAL RECORD NO.:  [id number]  LOCATION:  [facility]                         FACILITY:  [facility]  PHYSICIAN:  Sheral Apley. Glorimar Stroope, M.D.DATE OF BIRTH:  15-Jun-1941  DATE OF PROCEDURE:  11/17/2014 DATE OF DISCHARGE:                              OPERATIVE REPORT   PREOPERATIVE DIAGNOSIS:  Displaced intra-articular fracture, 4 part left distal radius.  POSTOPERATIVE DIAGNOSIS:  Displaced intra-articular fracture, 4 part left distal radius.  PROCEDURE:  Open reduction, internal fixation of above with DVR plate and screws, release of brachioradialis.  SURGEON:  Sheral Apley. Burney Gauze, M.D.  ASSISTANT:  None.  ANESTHESIA:  Block and general.  COMPLICATIONS:  No complication.  DRAINS:  No drains.  DESCRIPTION OF PROCEDURE:  The patient was taken to the operating suite. After induction of adequate supraclavicular block analgesia, laryngeal mask airway general anesthetic, left upper extremity was prepped and draped in sterile fashion.  An Esmarch was used to exsanguinate the limb.  Tourniquet was inflated to 250 mmHg.  At this point in time, incision was made over the palpable border the flexor carpi radialis tendon.  Skin incised sharply 6-7 cm.  The FCR sheath was incised.  The FCR was tracked in midline, radial artery to lateral side.  Fascia was incised.  Dissection was carried down to the pronator quadratus.  We subperiosteally stripped the pronator quadratus off the distal radius revealing a 4 part volarly displaced distal radius fracture.  We used a 15 blade, carefully released brachioradialis off the distal fragment to aid in reduction.  Reduction was performed with extension over a rolled up towel and then did a standard DVR plate left and to the slotted hole fastened to the volar aspect distal radius, downward pressure, then reduced the intra-articular component that was displaced volarly. Intraoperative fluoroscopy  revealed adequate reduction except for some slight displacement with a radial styloid fracture.  We then carefully reduced the radial styloid fracture with a reduction clamp and temporarily held with a 0.62 K-wire percutaneously to place remaining smooth cortical screws proximally followed by smooth pegs distally. Intraoperative fluoroscopy revealed adequate reduction in the AP, lateral, oblique view.  The K-wire was removed. The wound was irrigated.  closed in layers of 2-0 undyed Vicryl to cover the plate and subcutaneous tissues and a 3-0 Prolene subcuticular stitch on the skin.  Steri-Strips, 4 x 4s, fluffs, and a volar splint was applied.  The patient tolerated the procedure well in a concealed fashion.     Sheral Apley Burney Gauze, M.D.     MAW/MEDQ  D:  11/17/2014  T:  11/17/2014  Job:  [id number]

## 2014-11-17 NOTE — Progress Notes (Addendum)
During preop exam patient offered comfort measures. Patient with multiple complaints regarding bilateral leg pain, arm, and wrist. Repositioned patient several different times. I advised patient to notify Dr. Burney Gauze upon arrival. Patient also reports that she in concerned about paying for being in hospital and she asked in multiple different ways my opinion about a taking out a lawsuit against the place where she fell. I advised her I was not a Chief Executive Officer and that providing legal advise was beyond to scope of my practice as an Therapist, sports. Also, advised her to contact financial counselor at [facility] regarding payment concerns Patient became very anxious during IV start, starting pulling away, and tensing severly. I offered to stop and not start an IV but patient verbalized it was ok to proceed with IV start. I had to use assertive communication techniques to get her calmed down in order to facilitate getting the IV started despite trying to use calming techniques prior to IV start. Patient belongs as noted in checklist offered to send valuables to PACU patient refused. Explained to patient that [facility] could not be responsible for same.

## 2014-11-17 NOTE — Transfer of Care (Signed)
Immediate Anesthesia Transfer of Care Note  Patient: Taylor Edwards  Procedure(s) Performed: Procedure(s): OPEN REDUCTION INTERNAL FIXATION (ORIF) LEFT DISTAL RADIUS  FRACTURE (Left)  Patient Location: PACU  Anesthesia Type:General  Level of Consciousness: awake, alert  and oriented  Airway & Oxygen Therapy: Patient Spontanous Breathing and Patient connected to nasal cannula oxygen  Post-op Assessment: Report given to RN and Post -op Vital signs reviewed and stable  Post vital signs: Reviewed and stable  Last Vitals:  Filed Vitals:   11/17/14 0823  BP: 130/68  Pulse: 78  Temp: 36.8 C  Resp: 18    Complications: No apparent anesthesia complications

## 2014-11-17 NOTE — Anesthesia Preprocedure Evaluation (Signed)
Anesthesia Evaluation  Patient identified by MRN, date of birth, ID band Patient awake    Airway Mallampati: II       Dental   Pulmonary asthma , former smoker,  breath sounds clear to auscultation        Cardiovascular hypertension, Rhythm:Regular Rate:Normal     Neuro/Psych    GI/Hepatic GERD-  ,  Endo/Other  diabetesMorbid obesity  Renal/GU      Musculoskeletal  (+) Arthritis -,   Abdominal   Peds  Hematology   Anesthesia Other Findings   Reproductive/Obstetrics                             Anesthesia Physical Anesthesia Plan  ASA: II  Anesthesia Plan: General   Post-op Pain Management:    Induction: Intravenous  Airway Management Planned:   Additional Equipment:   Intra-op Plan:   Post-operative Plan: Extubation in OR  Informed Consent: I have reviewed the patients History and Physical, chart, labs and discussed the procedure including the risks, benefits and alternatives for the proposed anesthesia with the patient or authorized representative who has indicated his/her understanding and acceptance.   Dental advisory given  Plan Discussed with: CRNA and Surgeon  Anesthesia Plan Comments:         Anesthesia Quick Evaluation

## 2014-11-19 ENCOUNTER — Encounter (HOSPITAL_COMMUNITY): Payer: Self-pay | Admitting: Orthopedic Surgery

## 2014-11-21 ENCOUNTER — Ambulatory Visit
Admission: RE | Admit: 2014-11-21 | Discharge: 2014-11-21 | Disposition: A | Discharge status: Home / Self Care | Payer: Medicare Other | Source: Ambulatory Visit | Attending: Internal Medicine | Admitting: Internal Medicine

## 2014-11-21 ENCOUNTER — Other Ambulatory Visit: Payer: Self-pay | Admitting: Internal Medicine

## 2014-11-21 DIAGNOSIS — M25512 Pain in left shoulder: Secondary | ICD-10-CM

## 2015-01-21 ENCOUNTER — Other Ambulatory Visit: Payer: Self-pay

## 2015-01-21 DIAGNOSIS — Z1231 Encounter for screening mammogram for malignant neoplasm of breast: Secondary | ICD-10-CM

## 2015-01-22 ENCOUNTER — Other Ambulatory Visit: Payer: Self-pay

## 2015-01-22 DIAGNOSIS — Z1231 Encounter for screening mammogram for malignant neoplasm of breast: Secondary | ICD-10-CM

## 2015-03-04 ENCOUNTER — Ambulatory Visit
Admission: RE | Admit: 2015-03-04 | Discharge: 2015-03-04 | Disposition: A | Discharge status: Home / Self Care | Payer: Medicare Other | Source: Ambulatory Visit

## 2015-03-04 DIAGNOSIS — Z1231 Encounter for screening mammogram for malignant neoplasm of breast: Secondary | ICD-10-CM

## 2016-01-27 ENCOUNTER — Other Ambulatory Visit: Payer: Self-pay

## 2016-01-27 DIAGNOSIS — Z1231 Encounter for screening mammogram for malignant neoplasm of breast: Secondary | ICD-10-CM

## 2016-03-10 ENCOUNTER — Ambulatory Visit: Payer: Medicare Other

## 2016-04-07 ENCOUNTER — Ambulatory Visit
Admission: RE | Admit: 2016-04-07 | Discharge: 2016-04-07 | Disposition: A | Discharge status: Home / Self Care | Payer: Medicare Other | Source: Ambulatory Visit

## 2016-04-07 DIAGNOSIS — Z1231 Encounter for screening mammogram for malignant neoplasm of breast: Secondary | ICD-10-CM

## 2017-03-03 ENCOUNTER — Other Ambulatory Visit: Payer: Self-pay | Admitting: Geriatric Medicine

## 2017-03-03 DIAGNOSIS — Z1231 Encounter for screening mammogram for malignant neoplasm of breast: Secondary | ICD-10-CM

## 2017-04-08 ENCOUNTER — Ambulatory Visit: Payer: Medicare Other

## 2017-04-13 ENCOUNTER — Ambulatory Visit
Admission: RE | Admit: 2017-04-13 | Discharge: 2017-04-13 | Disposition: A | Discharge status: Home / Self Care | Payer: Medicare Other | Source: Ambulatory Visit | Attending: Geriatric Medicine | Admitting: Geriatric Medicine

## 2017-04-13 DIAGNOSIS — Z1231 Encounter for screening mammogram for malignant neoplasm of breast: Secondary | ICD-10-CM

## 2018-03-03 ENCOUNTER — Other Ambulatory Visit: Payer: Self-pay | Admitting: Geriatric Medicine

## 2018-03-03 DIAGNOSIS — Z1231 Encounter for screening mammogram for malignant neoplasm of breast: Secondary | ICD-10-CM

## 2018-03-19 ENCOUNTER — Other Ambulatory Visit: Payer: Self-pay | Admitting: Orthopaedic Surgery

## 2018-03-19 DIAGNOSIS — M545 Low back pain: Secondary | ICD-10-CM

## 2018-03-28 ENCOUNTER — Ambulatory Visit
Admission: RE | Admit: 2018-03-28 | Discharge: 2018-03-28 | Disposition: A | Discharge status: Home / Self Care | Payer: BC Managed Care – PPO | Source: Ambulatory Visit | Attending: Orthopaedic Surgery | Admitting: Orthopaedic Surgery

## 2018-03-28 DIAGNOSIS — M545 Low back pain: Secondary | ICD-10-CM

## 2018-04-14 ENCOUNTER — Ambulatory Visit: Payer: Medicare Other

## 2018-05-04 ENCOUNTER — Ambulatory Visit: Payer: Medicare Other

## 2018-05-06 ENCOUNTER — Ambulatory Visit: Payer: BC Managed Care – PPO

## 2018-05-06 ENCOUNTER — Ambulatory Visit
Admission: RE | Admit: 2018-05-06 | Discharge: 2018-05-06 | Disposition: A | Discharge status: Home / Self Care | Payer: BC Managed Care – PPO | Source: Ambulatory Visit | Attending: Geriatric Medicine | Admitting: Geriatric Medicine

## 2018-05-06 DIAGNOSIS — Z1231 Encounter for screening mammogram for malignant neoplasm of breast: Secondary | ICD-10-CM

## 2018-08-16 ENCOUNTER — Ambulatory Visit: Payer: BC Managed Care – PPO | Attending: Orthopaedic Surgery | Admitting: Physical Therapy

## 2018-08-16 ENCOUNTER — Encounter: Payer: Self-pay | Admitting: Physical Therapy

## 2018-08-16 ENCOUNTER — Other Ambulatory Visit: Payer: Self-pay

## 2018-08-16 DIAGNOSIS — R2689 Other abnormalities of gait and mobility: Secondary | ICD-10-CM | POA: Diagnosis present

## 2018-08-16 DIAGNOSIS — G8929 Other chronic pain: Secondary | ICD-10-CM | POA: Insufficient documentation

## 2018-08-16 DIAGNOSIS — R600 Localized edema: Secondary | ICD-10-CM

## 2018-08-16 DIAGNOSIS — M6281 Muscle weakness (generalized): Secondary | ICD-10-CM | POA: Diagnosis present

## 2018-08-16 DIAGNOSIS — M5442 Lumbago with sciatica, left side: Secondary | ICD-10-CM | POA: Diagnosis present

## 2018-08-16 DIAGNOSIS — M25561 Pain in right knee: Secondary | ICD-10-CM | POA: Insufficient documentation

## 2018-08-16 NOTE — Therapy (Addendum)
[place], [place], [address] Phone: [phone]   Fax:  [phone]  Physical Therapy Evaluation/Discharge   Patient Details  Name: Taylor Edwards MRN: [id number] Date of Birth: 08/03/1941 Referring Provider (PT): Melrose Nakayama MD   Encounter Date: 08/16/2018  PT End of Session - 08/16/18 1323    Visit Number  1    Number of Visits  16    Date for PT Re-Evaluation  10/11/18    Authorization Type  [employer]     PT Start Time  1100    PT Stop Time  1148    PT Time Calculation (min)  48 min    Activity Tolerance  Patient tolerated treatment well    Behavior During Therapy  [facility] for tasks assessed/performed       Past Medical History:  Diagnosis Date  . Anemia    no current med.  . Arthritis    knees  . Asthma    no current meds.  . Cataract of both eyes    early  . Diabetes mellitus    only taking natural supplements  . GERD (gastroesophageal reflux disease)    no current med.  . Hypertension    has been on med. x 10 yrs.  . IBS (irritable bowel syndrome)   . Post-nasal drip    current cough  . Sickle cell trait ([place])   . Urinary urgency     Past Surgical History:  Procedure Laterality Date  . ABDOMINAL HYSTERECTOMY    . BUNIONECTOMY  08/25/2011   Procedure: [name];  Surgeon: Hessie Dibble, MD;  Location: [place];  Service: Orthopedics;  Laterality: Right;  . CATARACT EXTRACTION     both eyes  . COLONOSCOPY W/ BIOPSIES AND POLYPECTOMY    . DILATION AND CURETTAGE OF UTERUS    . INJECTION KNEE  08/25/2011   Procedure: KNEE INJECTION;  Surgeon: Hessie Dibble, MD;  Location: [place];  Service: Orthopedics;  Laterality: Bilateral;  . JOINT REPLACEMENT    . KNEE ARTHROSCOPY  12/11/2008   bilat., with chondroplasty  . KNEE JOINT MANIPULATION  10/30/2009   bilat.  . OPEN REDUCTION INTERNAL FIXATION (ORIF) DISTAL RADIAL FRACTURE  Left 11/17/2014   Procedure: OPEN REDUCTION INTERNAL FIXATION (ORIF) LEFT DISTAL RADIUS  FRACTURE;  Surgeon: Charlotte Crumb, MD;  Location: [place];  Service: Orthopedics;  Laterality: Left;  . TOTAL KNEE ARTHROPLASTY  09/26/2009   bilat.    There were no vitals filed for this visit.   Subjective Assessment - 08/16/18 1103    Subjective  Bil TKA in 2011 and had to have bil manipulations secondary to increased knee stiffness a few months following. A year later fell onto R knee over a suitcase and had worsening pain and edema. X-ray negative on right knee. Patient also reporting long standing pain in low back with radiculopathy into LLE with occasional numbness.  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 Has had therapy off and on for back and knee from 2011 to 2016, but stopped 3-4 years ago. Pain has worsened over past 3 years.     Pertinent History  DM, asthma, Bil TKR   Limitations  Sitting;Standing;Walking;Other (comment);House hold activities   squatting; going up stairs, pays someone to come in and help  with cleaning acitivites   How long can you stand comfortably?  30 minutes     How long can you walk comfortably?  2 or 3 blocks     Patient Stated Goals  Walk up stairs pain free, stand, walk for longer     Currently in Pain?  Yes    Pain Score  4     Pain Location  Knee    Pain Orientation  Right;Anterior;Posterior    Pain Descriptors / Indicators  Aching    Pain Type  Chronic pain    Aggravating Factors   prolonged positioning , stairs     Pain Relieving Factors  alieve, tyolonel for pain as needed     Effect of Pain on Daily Activities  decreased mobility and ADLs     Multiple Pain Sites  Yes    Pain Score  1      Pain Location  Back    Pain Orientation  Mid    Pain Descriptors / Indicators  Burning;Sharp;Shooting    Pain Type  Chronic pain    Pain Radiating Towards  Radicular pain down LLE into foot with numbness     Pain Onset  More than a month ago    Pain Frequency   Constant    Aggravating Factors   prolonged positions  , standing/ wlaking can increase pain in back         [facility] PT Assessment - 08/17/18 0001      Assessment   Medical Diagnosis  R knee pain    Referring Provider (PT)  Melrose Nakayama MD    Onset Date/Surgical Date  --   Pain since 2011   Next MD Visit  nothing scheduled    Prior Therapy  Previous therapy on Bil knees and low back- has not had therapy since 2016      Precautions   Precautions  None      Restrictions   Weight Bearing Restrictions  No      Balance Screen   Has the patient fallen in the past 6 months  No    Has the patient had a decrease in activity level because of a fear of falling?   No    Is the patient reluctant to leave their home because of a fear of falling?   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       Cognition   Overall Cognitive Status  Within Functional Limits for tasks assessed    Attention  Focused    Focused Attention  Appears intact    Memory  Appears intact    Awareness  Appears intact    Problem Solving  Appears intact      Observation/Other Assessments-Edema    Edema  Circumferential      Circumferential Edema   Circumferential - Right  42.8    Circumferential - Left   41.5      Sensation   Light Touch  Appears Intact    Additional Comments  Left lower extermity and into her right thumb       Coordination   Gross Motor Movements are Fluid and Coordinated  Yes    Fine Motor Movements are Fluid and Coordinated  Yes      Posture/Postural Control   Posture/Postural Control  Postural limitations    Postural Limitations  Rounded Shoulders;Forward head;Weight shift right    Posture Comments  Weight shifted off of LLE as she has radicular sypmtoms down leg      AROM   Right Hip Flexion  100    Left  Hip Flexion  95   pain in low back    Right Knee Extension  -8    Right Knee Flexion  98    Left Knee Extension  -4    Left Knee  Flexion  102    Lumbar Flexion  limited 25%    Lumbar Extension  WFL    Lumbar - Right Side Bend  mid thigh    Lumbar - Left Side Bend  might thigh    Lumbar - Right Rotation  limited 25%    Lumbar - Left Rotation  limited 25%      PROM   Right Knee Flexion  101    Left Knee Flexion  108      Strength   Right Hip Flexion  4-/5    Right Hip ABduction  4/5    Right Hip ADduction  4/5    Left Hip Flexion  4-/5    Left Hip ABduction  4/5    Left Hip ADduction  4/5    Right Knee Flexion  4+/5    Right Knee Extension  4/5    Left Knee Flexion  5/5    Left Knee Extension  5/5      Palpation   Patella mobility  hypomobil L patella    Palpation comment  Tight lateral ditstal quads R>L ; TTP over IT band and lateral hamstring insertion      Special Tests   Other special tests  SLR (-) on L (only rpeorting hamstring tightness) ; Valgus stress test (-), varus stress test (-)       Ambulation/Gait   Gait Comments  Bil knee valgus, ambulates with wide BOS, decreased stance time on  RLE                Objective measurements completed on examination: See above findings.              PT Education - 08/16/18 1321    Education Details  New HEP, RICE, knee AROM; dry needling and potential benefits     Person(s) Educated  Patient    Methods  Explanation;Demonstration;Tactile cues;Verbal cues;Handout    Comprehension  Verbalized understanding;Returned demonstration;Verbal cues required       PT Short Term Goals - 08/16/18 1410      PT SHORT TERM GOAL #1   Title  Pt will demonstrate 4 deg improvement in R knee extension     Baseline  lacking 8 degrees of extension     Time  4    Period  Weeks    Status  New    Target Date  09/13/18      PT SHORT TERM GOAL #2   Title  Will decrease R lateral knee edema by 1cm    Baseline  R knee edema 42.8cm    Time  4    Period  Weeks    Status  New    Target Date  09/13/18      PT SHORT TERM GOAL #3   Title  Pt will  demonstrate 5/5 Knee extensor strength     Baseline  4/5     Time  4    Period  Weeks    Status  New    Target Date  09/13/18        PT Long Term Goals - 08/16/18 1539      PT LONG TERM GOAL #1   Title  Pt will report <2/10 R knee pain in order  to negotiate stairs with recipricol gait pattern    Baseline  7/10 knee pain with stairs     Time  8    Period  Weeks    Status  New    Target Date  10/11/18      PT LONG TERM GOAL #2   Title  Pt will improve R knee flexion by 10 deg and demonstrate good quad control in order to complete housework     Baseline  unable to perform cleaning activities secondary to knee pain and difficulty in squatting postions     Time  8    Period  Weeks    Status  New      PT LONG TERM GOAL #3   Title  Pt will improve lumbar flexion by 25% in order to pick items off of floor     Baseline  limited 25% secondary to pain     Time  8    Period  Weeks    Status  New    Target Date  10/11/18             Plan - 08/16/18 1339    Clinical Impression Statement  Pt is a 77 y.o. female presenting with right knee pain that has worsened since fall onto R knee in 2012. Bil TKA in 2011 with manipulations secondary to signficant tightness in months to follow. Pt demonstrates decreased knee AROM bilaterally with R>L and increased lateral edema in R knee. Pt with slight R lateral patella tracking and patella hypomobility in all directions. Pt inquiring about dry needling for knee as she has had PT on knee in years prior without much relief. Discussed purpose of dry needling with pt and potential for performing DN in future treatment sessions if having trigger points. Pt will benefit from skilled PT in order to improve strength, ROM, and overall mobility.     History and Personal Factors relevant to plan of care:  DM, Asthma     Clinical Presentation  Unstable    Clinical Presentation due to:  long standing knee pain secondary to fall a year post-op    Clinical  Decision Making  Moderate    Rehab Potential  Fair    Clinical Impairments Affecting Rehab Potential  long standing knee pain     PT Frequency  2x / week    PT Duration  8 weeks    PT Treatment/Interventions  ADLs/Self Care Home Management;Cryotherapy;Electrical Stimulation;Iontophoresis '4mg'$ /ml Dexamethasone;Moist Heat;Traction;Ultrasound;Gait training;Stair training;Therapeutic activities;Therapeutic exercise;Balance training;Neuromuscular re-education;Manual techniques;Passive range of motion;Dry needling;Taping    PT Next Visit Plan  Assess HEP; pt inquiring about dry needling for knee, taping for lateral tracking, IASTM to distal quads, quad stretch, heel slides, manual therapy for patella, AP/PA mobs as needed for tibiofemoral join, PROM of knee, retrograde massage for knee,quad sets, SAQ, SLR, assess low back tightness, light core stabilization, hamstring strech, quad stretch, Vasopnematic pump and modalaties as needed    PT Home Exercise Plan  Heel slides, quad sets, short arc quads    Consulted and Agree with Plan of Care  Patient       Patient will benefit from skilled therapeutic intervention in order to improve the following deficits and impairments:  Abnormal gait, Improper body mechanics, Pain, Hypomobility, Decreased activity tolerance, Decreased range of motion, Decreased strength, Decreased endurance, Increased edema  Visit Diagnosis: Chronic pain of right knee  Chronic bilateral low back pain with left-sided sciatica  Muscle weakness (generalized)  Localized edema  Other abnormalities  of gait and mobility    PHYSICAL THERAPY DISCHARGE SUMMARY  Visits from Start of Care: 1   Current functional level related to goals / functional outcomes: Did not return for follow up     Remaining deficits: Unknown    Education / Equipment: Unknown   Plan: Patient agrees to discharge.  Patient goals were not met. Patient is being discharged due to not returning since the last  visit.  ?????      Problem List Patient Active Problem List   Diagnosis Date Noted  . DERMATOFIBROMA 10/26/2007  . ANEMIA, CHRONIC 10/26/2007  . SICKLE CELL TRAIT 10/26/2007  . ESSENTIAL HYPERTENSION 10/26/2007  . GASTROESOPHAGEAL REFLUX DISEASE 10/26/2007  . GASTRITIS 10/26/2007  . HIATAL HERNIA 10/26/2007  . CONSTIPATION, CHRONIC 10/26/2007  . GUAIAC POSITIVE STOOL 10/26/2007   Carolyne Littles PT DPT  08/16/2018    Carney Living  SPT 08/17/2018, 8:42 AM   During this treatment session, the therapist was present, participating in and directing the treatment.   During this treatment session, the therapist was present, participating in and directing the treatment.   [place], [place], [address] Phone: [phone]   Fax:  [phone]  Name: Taylor Edwards MRN: [id number] Date of Birth: [birth year]

## 2018-08-16 NOTE — Patient Instructions (Signed)
Supine Heel Slide with Strap reps: 10 sets: 3 daily: 1 weekly: 7   Exercise image step 1   Exercise image step 2  Setup  Begin lying on your back with your legs straight, holding the ends of a strap that is looped around the bottom of one foot. Movement  Pull on the strap, sliding your heel toward your buttocks, then slide your heel back to the starting position and repeat. Tip  Make sure you keep your back flat against the bed during the exercise. Supine Quad Set reps: 10 sets: 3 daily: 1 weekly: 7   Exercise image step 1   Exercise image step 2  Setup  Begin lying on your back with one knee bent and your other leg straight with your knee resting on a towel roll. Movement  Gently squeeze your thigh muscles, pushing the back of your knee down into the towel. Tip  Make sure to keep your back flat against the floor during the exercise. Disclaimer: This program provides exercises related to your condition that you can perform at home. As there is a risk of injury with any activity, use caution when performing exercises. If you experience any pain or discomfort, discontinue the exercises and contact your health care provider.  Login URL: [email or web address] . Access Code: [email or web address] . Date printed: 08/16/2018 Page 2  Supine Knee Extension Strengthening reps: 10 sets: 3 daily: 1 weekly: 7   Exercise image step 1   Exercise image step 2  Setup  Begin lying on your back with one knee bent and the other resting on a ball. Movement  Straighten your knee by contracting your thigh muscles, keeping the back of your knee on the ball. Tip  Make sure not to arch your back during the exercise.

## 2018-09-08 ENCOUNTER — Ambulatory Visit: Payer: BC Managed Care – PPO | Admitting: Physical Therapy

## 2018-10-04 ENCOUNTER — Telehealth: Payer: Self-pay | Admitting: Physical Therapy

## 2018-10-04 ENCOUNTER — Encounter: Payer: BC Managed Care – PPO | Admitting: Physical Therapy

## 2018-10-04 NOTE — Telephone Encounter (Signed)
Attempted to call patient and left voicemail regarding no show for 10:15 appointment this AM with instructions to call back to reschedule if wishing to return to therapy.

## 2019-03-30 ENCOUNTER — Other Ambulatory Visit: Payer: Self-pay | Admitting: Geriatric Medicine

## 2019-03-30 DIAGNOSIS — Z1231 Encounter for screening mammogram for malignant neoplasm of breast: Secondary | ICD-10-CM

## 2019-04-03 ENCOUNTER — Other Ambulatory Visit: Payer: Self-pay

## 2019-04-03 ENCOUNTER — Encounter: Payer: Self-pay | Admitting: Orthopaedic Surgery

## 2019-04-03 ENCOUNTER — Ambulatory Visit: Payer: Self-pay

## 2019-04-03 ENCOUNTER — Ambulatory Visit (INDEPENDENT_AMBULATORY_CARE_PROVIDER_SITE_OTHER): Payer: BC Managed Care – PPO | Admitting: Orthopaedic Surgery

## 2019-04-03 DIAGNOSIS — Z96651 Presence of right artificial knee joint: Secondary | ICD-10-CM | POA: Diagnosis not present

## 2019-04-03 DIAGNOSIS — Z96652 Presence of left artificial knee joint: Secondary | ICD-10-CM

## 2019-04-03 DIAGNOSIS — Z96643 Presence of artificial hip joint, bilateral: Secondary | ICD-10-CM

## 2019-04-03 DIAGNOSIS — M25562 Pain in left knee: Secondary | ICD-10-CM | POA: Diagnosis not present

## 2019-04-03 DIAGNOSIS — M25561 Pain in right knee: Secondary | ICD-10-CM

## 2019-04-03 MED ORDER — ACETAMINOPHEN-CODEINE #3 300-30 MG PO TABS: 1.0000 | ORAL_TABLET | Freq: Three times a day (TID) | ORAL | 0 refills | Status: DC | PRN

## 2019-04-03 NOTE — Progress Notes (Signed)
Office Visit Note   Patient: Taylor Edwards           Date of Birth: 1941-03-28           MRN: [id number] Visit Date: 04/03/2019              Requested by: Lajean Manes, MD [address] [place],  [place] [address] PCP: Lajean Manes, MD   Assessment & Plan: Visit Diagnoses:  1. Left knee pain, unspecified chronicity   2. Right knee pain, unspecified chronicity   3. History of total knee replacement, left   4. History of total knee arthroplasty, right     Plan: I would like to at least send her to physical therapy to work on quad strengthening as well as any modalities that can help with her trochanteric pain or IT band pain.  I do feel this most essential to get her quad stronger.  Also feel that a three-phase bone scan to rule out prosthetic loosening is warranted.  All questions concerns were answered addressed.  I will see her back in about 6 weeks since she is traveling to [place] soon.  I will send in some Tylenol 3 for pain at night.  Follow-Up Instructions: Return in about 6 weeks (around 05/15/2019).   Orders:  Orders Placed This Encounter  Procedures  . XR Knee 1-2 Views Left  . XR Knee 1-2 Views Right   Meds ordered this encounter  Medications  . acetaminophen-codeine (TYLENOL #3) 300-30 MG tablet    Sig: Take 1-2 tablets by mouth every 8 (eight) hours as needed for moderate pain.    Dispense:  40 tablet    Refill:  0      Procedures: No procedures performed   Clinical Data: No additional findings.   Subjective: Chief Complaint  Patient presents with  . Left Knee - Pain  . Right Knee - Pain  The patient is a very pleasant and active 78 year old female who comes in for second opinion as it relates to bilateral knee pain.  She does have a history of bilateral knee replacements that were done at the same time in 2011.  These were done by 1 of my colleagues in town.  She states that she is been having still pain in both of her knees for  years now.  She did fall recently and wants her knees checked out a day.  She feels like she has a knot on her right knee and wants this checked out.  She does point to the trochanteric area of both her hips as a source of pain as well.  She denies any groin pain or any back issues.  She does feel like the right knee is worse than the left.  She does feel like that her muscles are atrophied on the right side comparing the right and left.  She said she has been checked out numerous times by her original orthopedic surgeon who cannot find any specific issues involving the knee replacements themselves.  She is been to physical therapy but is been remote and it is been a while.  She says her feet sometimes get cold on occasion but she does not get any edema in her legs.  She is recently had a full physical by her primary care physician who did not note any issues as it relates to her legs.  HPI  Review of Systems She currently denies any headache, chest pain, shortness of breath, fever, chills,  nausea, vomiting  Objective: Vital Signs: There were no vitals taken for this visit.  Physical Exam She is alert and orient x3 and in no acute distress Ortho Exam Examination of both her knees show well-healed midline surgical incisions.  I agree that there is some atrophy of the quad muscles on the right when compared to the right and the left.  The patellas themselves appear to track normally she is got good range of motion of both knees.  Both knees were cool.  She has pain over the IT band bilaterally in the trochanteric area bilaterally.  She has full range of motion of both hips with no pain in the groin and smooth rotation and motion of both hips.  Both knees feel ligamentously stable. Specialty Comments:  No specialty comments available.  Imaging: Xr Knee 1-2 Views Left  Result Date: 04/03/2019 2 views of the left knee show total knee arthroplasty with no complicating features.  The joint space is  still well-maintained and there is no gross evidence of loosening.  Xr Knee 1-2 Views Right  Result Date: 04/03/2019 An AP and lateral of the right knee shows a total knee arthroplasty.  There is slight lucency along the medial joint line but otherwise no other glaring deformities.    PMFS History: Patient Active Problem List   Diagnosis Date Noted  . History of total knee replacement, left 04/03/2019  . History of total knee arthroplasty, right 04/03/2019  . DERMATOFIBROMA 10/26/2007  . ANEMIA, CHRONIC 10/26/2007  . SICKLE CELL TRAIT 10/26/2007  . ESSENTIAL HYPERTENSION 10/26/2007  . GASTROESOPHAGEAL REFLUX DISEASE 10/26/2007  . GASTRITIS 10/26/2007  . HIATAL HERNIA 10/26/2007  . CONSTIPATION, CHRONIC 10/26/2007  . GUAIAC POSITIVE STOOL 10/26/2007   Past Medical History:  Diagnosis Date  . Anemia    no current med.  . Arthritis    knees  . Asthma    no current meds.  . Cataract of both eyes    early  . Diabetes mellitus    only taking natural supplements  . GERD (gastroesophageal reflux disease)    no current med.  . Hypertension    has been on med. x 10 yrs.  . IBS (irritable bowel syndrome)   . Post-nasal drip    current cough  . Sickle cell trait ([place])   . Urinary urgency     Family History  Problem Relation Age of Onset  . Heart disease Mother   . Diabetes Other   . Hypertension Other   . Breast cancer Sister 51  . Colon cancer Neg Hx   . Stomach cancer Neg Hx     Past Surgical History:  Procedure Laterality Date  . ABDOMINAL HYSTERECTOMY    . BUNIONECTOMY  08/25/2011   Procedure: [name];  Surgeon: Hessie Dibble, MD;  Location: [place];  Service: Orthopedics;  Laterality: Right;  . CATARACT EXTRACTION     both eyes  . COLONOSCOPY W/ BIOPSIES AND POLYPECTOMY    . DILATION AND CURETTAGE OF UTERUS    . INJECTION KNEE  08/25/2011   Procedure: KNEE INJECTION;  Surgeon: Hessie Dibble, MD;  Location: [place];   Service: Orthopedics;  Laterality: Bilateral;  . JOINT REPLACEMENT    . KNEE ARTHROSCOPY  12/11/2008   bilat., with chondroplasty  . KNEE JOINT MANIPULATION  10/30/2009   bilat.  . OPEN REDUCTION INTERNAL FIXATION (ORIF) DISTAL RADIAL FRACTURE Left 11/17/2014   Procedure: OPEN REDUCTION INTERNAL FIXATION (ORIF) LEFT DISTAL RADIUS  FRACTURE;  Surgeon: Charlotte Crumb, MD;  Location: [place];  Service: Orthopedics;  Laterality: Left;  . TOTAL KNEE ARTHROPLASTY  09/26/2009   bilat.   Social History   Occupational History  . Not on file  Tobacco Use  . Smoking status: Former Smoker    Types: Cigarettes    Quit date: 05/05/1985    Years since quitting: 33.9  . Smokeless tobacco: Never Used  Substance and Sexual Activity  . Alcohol use: Yes    Alcohol/week: 4.0 standard drinks    Types: 4 Glasses of wine per week    Comment: wkends.  . Drug use: No  . Sexual activity: Not on file

## 2019-04-04 ENCOUNTER — Telehealth: Payer: Self-pay

## 2019-04-04 NOTE — Telephone Encounter (Signed)
Gerald Stabs, physical therapist at [facility]. Called to verify referral for patient.  Stated that maybe the order was incorrect due to only seeing that the patient has had bilateral knee replacements.  Cb# is [phone].  Please advise.  Thank you.

## 2019-04-05 ENCOUNTER — Ambulatory Visit: Payer: BC Managed Care – PPO | Admitting: Orthopaedic Surgery

## 2019-04-05 ENCOUNTER — Other Ambulatory Visit: Payer: Self-pay

## 2019-04-05 ENCOUNTER — Telehealth: Payer: Self-pay | Admitting: Orthopedic Surgery

## 2019-04-05 ENCOUNTER — Telehealth: Payer: Self-pay | Admitting: Physical Therapy

## 2019-04-05 DIAGNOSIS — M7062 Trochanteric bursitis, left hip: Secondary | ICD-10-CM

## 2019-04-05 DIAGNOSIS — M7061 Trochanteric bursitis, right hip: Secondary | ICD-10-CM

## 2019-04-05 NOTE — Telephone Encounter (Signed)
Sent new updated Rx

## 2019-04-05 NOTE — Telephone Encounter (Signed)
Called and left message that the original order is correct

## 2019-04-05 NOTE — Telephone Encounter (Signed)
Please send Gerald Stabs an updated P.T. order addressing patient's hip pain.  This would be to clear up any confusion because the order is for her hip, but she has only had knee replacements/complaints.  Order can be placed in [place].

## 2019-04-05 NOTE — Telephone Encounter (Signed)
Returned message from Macy at Dr. Trevor Mace office regarding physical therapy order. As of today patient is not scheduled for therapy at this facility but if scheduling/wishing to pursue therapy at this location will evaluate and treat per MD order.

## 2019-04-06 ENCOUNTER — Other Ambulatory Visit: Payer: Self-pay

## 2019-04-06 ENCOUNTER — Ambulatory Visit: Payer: BC Managed Care – PPO | Attending: Orthopaedic Surgery | Admitting: Physical Therapy

## 2019-04-06 ENCOUNTER — Encounter: Payer: Self-pay | Admitting: Physical Therapy

## 2019-04-06 DIAGNOSIS — M6281 Muscle weakness (generalized): Secondary | ICD-10-CM | POA: Insufficient documentation

## 2019-04-06 DIAGNOSIS — M25551 Pain in right hip: Secondary | ICD-10-CM | POA: Insufficient documentation

## 2019-04-06 DIAGNOSIS — M25552 Pain in left hip: Secondary | ICD-10-CM | POA: Insufficient documentation

## 2019-04-06 NOTE — Therapy (Signed)
[place], [place], [address] Phone: [phone]   Fax:  [phone]  Physical Therapy Evaluation  Patient Details  Name: Taylor Edwards MRN: [id number] Date of Birth: Jun 05, 1941 Referring Provider (PT): Jean Rosenthal MD   Encounter Date: 04/06/2019  PT End of Session - 04/06/19 1328    Visit Number  1    Number of Visits  13    Date for PT Re-Evaluation  05/18/19    PT Start Time  1331    PT Stop Time  1420    PT Time Calculation (min)  49 min    Activity Tolerance  Patient tolerated treatment well    Behavior During Therapy  [facility] for tasks assessed/performed       Past Medical History:  Diagnosis Date  . Anemia    no current med.  . Arthritis    knees  . Asthma    no current meds.  . Cataract of both eyes    early  . Diabetes mellitus    only taking natural supplements  . GERD (gastroesophageal reflux disease)    no current med.  . Hypertension    has been on med. x 10 yrs.  . IBS (irritable bowel syndrome)   . Post-nasal drip    current cough  . Sickle cell trait ([place])   . Urinary urgency     Past Surgical History:  Procedure Laterality Date  . ABDOMINAL HYSTERECTOMY    . BUNIONECTOMY  08/25/2011   Procedure: [name];  Surgeon: Hessie Dibble, MD;  Location: [place];  Service: Orthopedics;  Laterality: Right;  . CATARACT EXTRACTION     both eyes  . COLONOSCOPY W/ BIOPSIES AND POLYPECTOMY    . DILATION AND CURETTAGE OF UTERUS    . INJECTION KNEE  08/25/2011   Procedure: KNEE INJECTION;  Surgeon: Hessie Dibble, MD;  Location: [place];  Service: Orthopedics;  Laterality: Bilateral;  . JOINT REPLACEMENT    . KNEE ARTHROSCOPY  12/11/2008   bilat., with chondroplasty  . KNEE JOINT MANIPULATION  10/30/2009   bilat.  . OPEN REDUCTION INTERNAL FIXATION (ORIF) DISTAL RADIAL FRACTURE Left 11/17/2014   Procedure: OPEN REDUCTION INTERNAL  FIXATION (ORIF) LEFT DISTAL RADIUS  FRACTURE;  Surgeon: Charlotte Crumb, MD;  Location: [place];  Service: Orthopedics;  Laterality: Left;  . TOTAL KNEE ARTHROPLASTY  09/26/2009   bilat.    There were no vitals filed for this visit.   Subjective Assessment - 04/06/19 1343    Subjective  pt is a 78 y.o F with CC of bil hip pain that started 1-2 months ago. she denies any MOI and reports it is due to lack of exercise and prolonged sitting. pain stays mostly in the hips. The pain fluctuates depending on if she is sitting, but notes the pain goes away with walking    Limitations  Sitting    How long can you sit comfortably?  3-4 hours    How long can you stand comfortably?  1 hour    How long can you walk comfortably?  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  Yes    Pain Score  0-No pain   at worst 5/10   Pain Location  Hip    Pain Orientation  Right;Left;Lateral    Pain Descriptors / Indicators  Sore    Pain Type  Chronic pain    Pain Onset  More than a month ago    Pain Frequency  Intermittent    Aggravating Factors   prolonged sitting/ and long sitting    Pain Relieving Factors  getting up and moving around, massage/ heating pad         [facility] PT Assessment - 04/06/19 1328      Assessment   Medical Diagnosis  Trochanteric bursitis of both hips     Referring Provider (PT)  Jean Rosenthal MD    Onset Date/Surgical Date  --   1-2 monts ago   Hand Dominance  Right    Next MD Visit  when she gets back from vacation    Prior Therapy  yes   for TKA     Precautions   Precautions  None      Restrictions   Weight Bearing Restrictions  No      Balance Screen   Has the patient fallen in the past 6 months  No      [place] residence    Living Arrangements  Alone    Type of [place] to enter    Entrance Stairs-Number of Steps  4    Entrance Stairs-Rails  Can reach both    [place]  Two level    Alternate Level Stairs-Number of Steps  15    Alternate Level Stairs-Rails  Right   only half way up   [place] - single point      Prior Function   Level of [place] with basic ADLs    Vocation  Full time employment   [place]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   Leisure  riding around town, going to the movies      Cognition   Overall Cognitive Status  Within Functional Limits for tasks assessed      Posture/Postural Control   Posture/Postural Control  Postural limitations    Postural Limitations  Rounded Shoulders;Forward head      ROM / Strength   AROM / PROM / Strength  AROM;PROM;Strength      AROM   AROM Assessment Site  Hip    Right/Left Hip  Right;Left      Strength   Strength Assessment Site  Hip;Knee    Right/Left Hip  Right;Left    Right Hip Flexion  4-/5    Right Hip Extension  4-/5    Right Hip ABduction  4-/5    Left Hip Flexion  4-/5    Left Hip Extension  4-/5    Left Hip ABduction  4-/5    Left Hip ADduction  4-/5    Right/Left Knee  Right;Left    Right Knee Flexion  4+/5    Right Knee Extension  4+/5    Left Knee Flexion  4+/5    Left Knee Extension  4+/5      Palpation   Palpation comment  TTP along the bil glute med and along the greater trochanter      Special Tests    Special Tests  Hip Special Tests    Hip Special Tests   [facility] Test;Ober's Test      [facility] Test    Findings  Positive    Side  Left;Right    Comments  increased hip hike      Ober's Test   Findings  Negative    Side  Right;Left      Ambulation/Gait   Ambulation/Gait  Yes    Gait Pattern  Step-through pattern                Objective measurements completed on examination: See above findings.      [facility] Adult PT Treatment/Exercise - 04/06/19 1328      Exercises    Exercises  Knee/Hip      Knee/Hip Exercises: Stretches   ITB Stretch  2 reps;30 seconds;Both   supine with strap     Knee/Hip Exercises: Supine   Straight Leg Raises  2 sets;10 reps;Both;Strengthening    Other Supine Knee/Hip Exercises  clamshell 2 x 10 with green theraband             PT Education - 04/06/19 1414    Education Details  evaluation findings, POC, goals, HEP with proper form/ rationale    Person(s) Educated  Patient    Methods  Explanation;Verbal cues;Handout    Comprehension  Verbalized understanding;Verbal cues required       PT Short Term Goals - 04/06/19 1431      PT SHORT TERM GOAL #1   Title  pt to be I with inital HEP    Time  3    Period  Weeks    Status  New    Target Date  04/27/19        PT Long Term Goals - 04/06/19 1431      PT LONG TERM GOAL #1   Title  pt to increase bil hip gross strength to >/= 4+/5 in all planes to promote hip knee stability with walking/ standing    Time  6    Period  Weeks    Status  New    Target Date  05/18/19      PT LONG TERM GOAL #2   Title  pt to be able to naviage >/= 18 steps reciprocally with no report of pain or limation    Time  6    Period  Weeks    Status  New    Target Date  05/18/19      PT LONG TERM GOAL #3   Title  pt to increase FOTO score </= 49% limited to demo improvement in function    Time  6    Period  Weeks    Status  New    Target Date  05/18/19      PT LONG TERM GOAL #4   Title  pt to be able to be I with all HEP given as of last visit to [place] and progress current level of function    Time  6    Period  Weeks    Status  New    Target Date  05/18/19             Plan - 04/06/19 1427    Clinical Impression Statement  pt presents to OPPT with CC of bil hip pain starting 1-2 months ago with no specific MOI. She has functional hip mobility and mild weakness in bil hips. TTP noted along the glute med and greater trochanter which she noted improvement of pain with  walking, but symptoms fluctuate depending on positioning.    Personal Factors and Comorbidities  Age;Comorbidity 3+    Comorbidities  significant Surgical PMHx, hx of arthrits, DM, sickle cell trait    Examination-Activity Limitations  Stairs    Stability/Clinical Decision Making  Evolving/Moderate complexity    Clinical Decision Making  Moderate    Rehab Potential  Good    PT Frequency  2x / week    PT Duration  6 weeks    PT Treatment/Interventions  ADLs/Self Care Home Management;Cryotherapy;Electrical Stimulation;Moist Heat;Ultrasound;Therapeutic activities;Therapeutic exercise;Manual techniques;Passive range of motion;Dry needling;Taping    PT Next Visit Plan  review / update HEP, STW along glute medius/ vastus lateralis, hip/ knee strengthening, stair training,    PT Home Exercise Plan  supine clam, SLR with sustained quad set, ITB stretching (in supine), bridge    Consulted and Agree with Plan of Care  Patient       Patient will benefit from skilled therapeutic intervention in order to improve the following deficits and impairments:  Pain, Obesity, Decreased activity tolerance, Improper body mechanics, Postural dysfunction, Decreased strength, Decreased endurance  Visit Diagnosis: 1. Pain in right hip   2. Pain in left hip   3. Muscle weakness (generalized)        Problem List Patient Active Problem List   Diagnosis Date Noted  . History of total knee replacement, left 04/03/2019  . History of total knee arthroplasty, right 04/03/2019  . DERMATOFIBROMA 10/26/2007  . ANEMIA, CHRONIC 10/26/2007  . SICKLE CELL TRAIT 10/26/2007  . ESSENTIAL HYPERTENSION 10/26/2007  . GASTROESOPHAGEAL REFLUX DISEASE 10/26/2007  . GASTRITIS 10/26/2007  . HIATAL HERNIA 10/26/2007  . CONSTIPATION, CHRONIC 10/26/2007  . GUAIAC POSITIVE STOOL 10/26/2007   Starr Lake PT, DPT, LAT, ATC  04/06/19  2:40 PM      [facility] [address] [place], [place], [address] Phone: [phone]   Fax:  [phone]  Name: Taylor Edwards MRN: [id number] Date of Birth: October 28, 1940

## 2019-04-26 ENCOUNTER — Other Ambulatory Visit: Payer: Self-pay | Admitting: Physician Assistant

## 2019-04-26 ENCOUNTER — Telehealth: Payer: Self-pay | Admitting: Orthopaedic Surgery

## 2019-04-26 ENCOUNTER — Ambulatory Visit (HOSPITAL_COMMUNITY): Payer: Medicare Other

## 2019-04-26 ENCOUNTER — Encounter (HOSPITAL_COMMUNITY): Admission: RE | Admit: 2019-04-26 | Payer: Medicare Other | Source: Ambulatory Visit

## 2019-04-26 MED ORDER — ACETAMINOPHEN-CODEINE #3 300-30 MG PO TABS: 1.0000 | ORAL_TABLET | Freq: Three times a day (TID) | ORAL | 0 refills | Status: DC | PRN

## 2019-04-26 NOTE — Telephone Encounter (Signed)
Please adivse

## 2019-04-26 NOTE — Telephone Encounter (Signed)
Patient called requesting an RX refill on her Tylenol #3.  Patient uses [facility] on [facility].  [phone].  Thank you.

## 2019-04-27 ENCOUNTER — Other Ambulatory Visit: Payer: Self-pay

## 2019-04-27 ENCOUNTER — Encounter: Payer: Self-pay | Admitting: Physical Therapy

## 2019-04-27 ENCOUNTER — Ambulatory Visit: Payer: BC Managed Care – PPO | Attending: Orthopaedic Surgery | Admitting: Physical Therapy

## 2019-04-27 DIAGNOSIS — M6281 Muscle weakness (generalized): Secondary | ICD-10-CM | POA: Diagnosis present

## 2019-04-27 DIAGNOSIS — M25551 Pain in right hip: Secondary | ICD-10-CM | POA: Insufficient documentation

## 2019-04-27 DIAGNOSIS — M25552 Pain in left hip: Secondary | ICD-10-CM | POA: Diagnosis present

## 2019-04-27 NOTE — Therapy (Signed)
[place], [place], [address] Phone: [phone]   Fax:  [phone]  Physical Therapy Treatment  Patient Details  Name: Taylor Edwards MRN: [id number] Date of Birth: 06-Oct-1940 Referring Provider (PT): Jean Rosenthal MD   Encounter Date: 04/27/2019  PT End of Session - 04/27/19 1149    Visit Number  2    Number of Visits  13    Date for PT Re-Evaluation  05/18/19    PT Start Time  1148    PT Stop Time  1228    PT Time Calculation (min)  40 min    Activity Tolerance  Patient tolerated treatment well       Past Medical History:  Diagnosis Date  . Anemia    no current med.  . Arthritis    knees  . Asthma    no current meds.  . Cataract of both eyes    early  . Diabetes mellitus    only taking natural supplements  . GERD (gastroesophageal reflux disease)    no current med.  . Hypertension    has been on med. x 10 yrs.  . IBS (irritable bowel syndrome)   . Post-nasal drip    current cough  . Sickle cell trait ([place])   . Urinary urgency     Past Surgical History:  Procedure Laterality Date  . ABDOMINAL HYSTERECTOMY    . BUNIONECTOMY  08/25/2011   Procedure: [name];  Surgeon: Hessie Dibble, MD;  Location: [place];  Service: Orthopedics;  Laterality: Right;  . CATARACT EXTRACTION     both eyes  . COLONOSCOPY W/ BIOPSIES AND POLYPECTOMY    . DILATION AND CURETTAGE OF UTERUS    . INJECTION KNEE  08/25/2011   Procedure: KNEE INJECTION;  Surgeon: Hessie Dibble, MD;  Location: [place];  Service: Orthopedics;  Laterality: Bilateral;  . JOINT REPLACEMENT    . KNEE ARTHROSCOPY  12/11/2008   bilat., with chondroplasty  . KNEE JOINT MANIPULATION  10/30/2009   bilat.  . OPEN REDUCTION INTERNAL FIXATION (ORIF) DISTAL RADIAL FRACTURE Left 11/17/2014   Procedure: OPEN REDUCTION INTERNAL FIXATION (ORIF) LEFT DISTAL RADIUS  FRACTURE;  Surgeon: Charlotte Crumb, MD;  Location: [place];  Service: Orthopedics;  Laterality: Left;  . TOTAL KNEE ARTHROPLASTY  09/26/2009   bilat.    There were no vitals filed for this visit.  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  Yes    Pain Score  9     Pain Location  Hip    Pain Orientation  Right;Left;Lateral    Pain Descriptors / Indicators  Aching    Pain Type  Chronic pain    Pain Onset  More than a month ago    Pain Frequency  Intermittent    Aggravating Factors   prolonged sitting/ standing    Pain Relieving Factors  getting up and moving around,                       [facility] Adult PT Treatment/Exercise - 04/27/19 0001      Knee/Hip  Exercises: Aerobic   Nustep  L5 x 5 min LE only      Knee/Hip Exercises: Standing   Step Down  1 set;10 reps;Step Height: 4"   standing on the RLE, tactile cues for proper form   Stairs  up/ down 6 inche steps x 4, reverted to 4 inch steps due to pt demonstration apprehension with descending while standing on the RLE.       Knee/Hip Exercises: Supine   Quad Sets  2 sets;10 reps;Strengthening   with ball squeeze   Heel Slides  Right;1 set;10 reps    Straight Leg Raises  1 set;10 reps    Other Supine Knee/Hip Exercises  clamshell 2 x 10 with green theraband    Other Supine Knee/Hip Exercises  hip knee flexion with heels on red physioball 2 x 10,,       Manual Therapy   Manual Therapy  Soft tissue mobilization    Manual therapy comments  MTPR along the vastus lateralis x 3    Soft tissue mobilization  IASTM along the vastus lateralis                PT Short Term Goals - 04/06/19 1431      PT SHORT TERM GOAL #1   Title  pt to be I with inital HEP    Time  3    Period  Weeks    Status  New     Target Date  04/27/19        PT Long Term Goals - 04/06/19 1431      PT LONG TERM GOAL #1   Title  pt to increase bil hip gross strength to >/= 4+/5 in all planes to promote hip knee stability with walking/ standing    Time  6    Period  Weeks    Status  New    Target Date  05/18/19      PT LONG TERM GOAL #2   Title  pt to be able to naviage >/= 18 steps reciprocally with no report of pain or limation    Time  6    Period  Weeks    Status  New    Target Date  05/18/19      PT LONG TERM GOAL #3   Title  pt to increase FOTO score </= 49% limited to demo improvement in function    Time  6    Period  Weeks    Status  New    Target Date  05/18/19      PT LONG TERM GOAL #4   Title  pt to be able to be I with all HEP given as of last visit to [facility] and progress current level of function    Time  6    Period  Weeks    Status  New    Target Date  05/18/19            Plan - 04/27/19 1235    Clinical Impression Statement  pt demonstrates limited knee ROM today with increased observable swelling. STW along the vastus lateralis and VMO activation she noted decrease knee pain and improvement with flexion. worked on IT trainer which she demonstrated apprehension with descent requiring verbal cues for proper form. she reported no pain at the end of the session which initally was 9/10.    PT Treatment/Interventions  ADLs/Self Care Home Management;Cryotherapy;Electrical Stimulation;Moist Heat;Ultrasound;Therapeutic activities;Therapeutic exercise;Manual techniques;Passive range of motion;Dry needling;Taping    PT  Next Visit Plan  review / update HEP, STW along glute medius/ vastus lateralis, hip/ knee strengthening, stair training,    PT Home Exercise Plan  supine clam, SLR with sustained quad set, ITB stretching (in supine), bridge    Consulted and Agree with Plan of Care  Patient       Patient will benefit from skilled therapeutic intervention in order to improve the  following deficits and impairments:  Pain, Obesity, Decreased activity tolerance, Improper body mechanics, Postural dysfunction, Decreased strength, Decreased endurance  Visit Diagnosis: 1. Pain in right hip   2. Pain in left hip   3. Muscle weakness (generalized)        Problem List Patient Active Problem List   Diagnosis Date Noted  . History of total knee replacement, left 04/03/2019  . History of total knee arthroplasty, right 04/03/2019  . DERMATOFIBROMA 10/26/2007  . ANEMIA, CHRONIC 10/26/2007  . SICKLE CELL TRAIT 10/26/2007  . ESSENTIAL HYPERTENSION 10/26/2007  . GASTROESOPHAGEAL REFLUX DISEASE 10/26/2007  . GASTRITIS 10/26/2007  . HIATAL HERNIA 10/26/2007  . CONSTIPATION, CHRONIC 10/26/2007  . GUAIAC POSITIVE STOOL 10/26/2007   Starr Lake PT, DPT, LAT, ATC  04/27/19  12:38 PM      [place] [facility] [address] [place], [place], [address] Phone: [phone]   Fax:  [phone]  Name: GARRIE ELENES MRN: [id number] Date of Birth: 11-22-1940

## 2019-05-03 ENCOUNTER — Encounter (HOSPITAL_COMMUNITY): Payer: Medicare Other

## 2019-05-05 ENCOUNTER — Encounter

## 2019-05-05 ENCOUNTER — Encounter (HOSPITAL_COMMUNITY)
Admission: RE | Admit: 2019-05-05 | Discharge: 2019-05-05 | Disposition: A | Discharge status: Home / Self Care | Payer: BC Managed Care – PPO | Source: Ambulatory Visit | Attending: Orthopaedic Surgery | Admitting: Orthopaedic Surgery

## 2019-05-05 ENCOUNTER — Other Ambulatory Visit: Payer: Self-pay | Admitting: Orthopaedic Surgery

## 2019-05-05 ENCOUNTER — Other Ambulatory Visit: Payer: Self-pay

## 2019-05-05 DIAGNOSIS — Z96643 Presence of artificial hip joint, bilateral: Secondary | ICD-10-CM | POA: Insufficient documentation

## 2019-05-05 DIAGNOSIS — Z96653 Presence of artificial knee joint, bilateral: Secondary | ICD-10-CM

## 2019-05-05 MED ORDER — TECHNETIUM TC 99M MEDRONATE IV KIT
21.7000 | PACK | Freq: Once | INTRAVENOUS | Status: AC | PRN
  Administered 2019-05-05: 21.7 via INTRAVENOUS

## 2019-05-11 ENCOUNTER — Other Ambulatory Visit: Payer: Self-pay

## 2019-05-11 ENCOUNTER — Ambulatory Visit: Payer: BC Managed Care – PPO | Admitting: Physical Therapy

## 2019-05-11 ENCOUNTER — Encounter: Payer: Self-pay | Admitting: Physical Therapy

## 2019-05-11 DIAGNOSIS — M25551 Pain in right hip: Secondary | ICD-10-CM

## 2019-05-11 DIAGNOSIS — M25552 Pain in left hip: Secondary | ICD-10-CM

## 2019-05-11 DIAGNOSIS — M6281 Muscle weakness (generalized): Secondary | ICD-10-CM

## 2019-05-11 NOTE — Therapy (Signed)
[place], [place], [address] Phone: [phone]   Fax:  [phone]  Physical Therapy Treatment  Patient Details  Name: Taylor Edwards MRN: [id number] Date of Birth: 04/02/1941 Referring Provider (PT): Jean Rosenthal MD   Encounter Date: 05/11/2019  PT End of Session - 05/11/19 1549    Visit Number  3    Number of Visits  13    Date for PT Re-Evaluation  05/18/19    PT Start Time  1501    PT Stop Time  6834   time spent dry needling not included in direct minutes   PT Time Calculation (min)  43 min    Activity Tolerance  Patient tolerated treatment well    Behavior During Therapy  [facility] for tasks assessed/performed       Past Medical History:  Diagnosis Date  . Anemia    no current med.  . Arthritis    knees  . Asthma    no current meds.  . Cataract of both eyes    early  . Diabetes mellitus    only taking natural supplements  . GERD (gastroesophageal reflux disease)    no current med.  . Hypertension    has been on med. x 10 yrs.  . IBS (irritable bowel syndrome)   . Post-nasal drip    current cough  . Sickle cell trait ([place])   . Urinary urgency     Past Surgical History:  Procedure Laterality Date  . ABDOMINAL HYSTERECTOMY    . BUNIONECTOMY  08/25/2011   Procedure: [name];  Surgeon: Hessie Dibble, MD;  Location: [place];  Service: Orthopedics;  Laterality: Right;  . CATARACT EXTRACTION     both eyes  . COLONOSCOPY W/ BIOPSIES AND POLYPECTOMY    . DILATION AND CURETTAGE OF UTERUS    . INJECTION KNEE  08/25/2011   Procedure: KNEE INJECTION;  Surgeon: Hessie Dibble, MD;  Location: [place];  Service: Orthopedics;  Laterality: Bilateral;  . JOINT REPLACEMENT    . KNEE ARTHROSCOPY  12/11/2008   bilat., with chondroplasty  . KNEE JOINT MANIPULATION  10/30/2009   bilat.  . OPEN REDUCTION INTERNAL FIXATION (ORIF) DISTAL RADIAL FRACTURE  Left 11/17/2014   Procedure: OPEN REDUCTION INTERNAL FIXATION (ORIF) LEFT DISTAL RADIUS  FRACTURE;  Surgeon: Charlotte Crumb, MD;  Location: [place];  Service: Orthopedics;  Laterality: Left;  . TOTAL KNEE ARTHROPLASTY  09/26/2009   bilat.    There were no vitals filed for this visit.  Subjective Assessment - 05/11/19 1504    Subjective  Pt. reports that the "spots" on her right leg are suspected as being associated with cobalt poisoning from previous surgery. She reports last session was helpful for hip pain and is having less sleep disturbance.    Currently in Pain?  No/denies                       [facility] Adult PT Treatment/Exercise - 05/11/19 0001      Knee/Hip Exercises: Stretches   ITB Stretch  Right;Left;3 reps;20 seconds      Knee/Hip Exercises: Standing   Abduction Limitations  x 10 ea. side with green Theraband proximal to knees    Forward Step Up  Both;1 set;10 reps;Hand Hold: 2;Step Height: 4"    Functional Squat Limitations  partial squat at counter 2x10      Knee/Hip Exercises: Seated   Long [employer]  AROM;Strengthening;Both;2 sets;10 reps    Long [place] Quad Limitations  with green Theraband    Clamshell with TheraBand  Green   2x10     Knee/Hip Exercises: Supine   Other Supine Knee/Hip Exercises  clamshell green band 2x10, abduction hip bridge with green band 2x10      Manual Therapy   Soft tissue mobilization  STM/IASTM bilateral gluteus medius, TFL and IT band/vastus lateralis       Trigger Point Dry Needling - 05/11/19 0001    Consent Given?  Yes    Education Handout Provided  Yes    Muscles Treated Back/Hip  Gluteus medius    Dry Needling Comments  brief needling of bilat. gluteus medius in sidelying with 30 gauge 75 mm needles sued           PT Education - 05/11/19 1548    Education Details  HEP updates, POC, dry needling    Person(s) Educated  Patient    Methods  Explanation;Handout;Demonstration    Comprehension  Verbalized  understanding;Returned demonstration       PT Short Term Goals - 04/06/19 1431      PT SHORT TERM GOAL #1   Title  pt to be I with inital HEP    Time  3    Period  Weeks    Status  New    Target Date  04/27/19        PT Long Term Goals - 04/06/19 1431      PT LONG TERM GOAL #1   Title  pt to increase bil hip gross strength to >/= 4+/5 in all planes to promote hip knee stability with walking/ standing    Time  6    Period  Weeks    Status  New    Target Date  05/18/19      PT LONG TERM GOAL #2   Title  pt to be able to naviage >/= 18 steps reciprocally with no report of pain or limation    Time  6    Period  Weeks    Status  New    Target Date  05/18/19      PT LONG TERM GOAL #3   Title  pt to increase FOTO score </= 49% limited to demo improvement in function    Time  6    Period  Weeks    Status  New    Target Date  05/18/19      PT LONG TERM GOAL #4   Title  pt to be able to be I with all HEP given as of last visit to [facility] and progress current level of function    Time  6    Period  Weeks    Status  New    Target Date  05/18/19            Plan - 05/11/19 1532    Clinical Impression Statement  Improving from baseline status with decreased hip/thigh and knee discomfort. Still with some ongoing knee swelling and progress with strengthening will take time to improve gait/stair mechanics but progressing re: goals from baseline.    Personal Factors and Comorbidities  Age;Comorbidity 3+    Comorbidities  significant Surgical PMHx, hx of arthrits, DM, sickle cell trait    Examination-Activity Limitations  Stairs    Stability/Clinical Decision Making  Evolving/Moderate complexity    Clinical Decision Making  Moderate    Rehab Potential  Good    PT Frequency  2x /  week    PT Duration  6 weeks    PT Treatment/Interventions  ADLs/Self Care Home Management;Cryotherapy;Electrical Stimulation;Moist Heat;Ultrasound;Therapeutic activities;Therapeutic  exercise;Manual techniques;Passive range of motion;Dry needling;Taping    PT Next Visit Plan  check response to dry needling, review / update HEP as needed, STW along glute medius/ vastus lateralis, hip/ knee strengthening, stair training,    PT Home Exercise Plan  supine clam, SLR with sustained quad set, ITB stretching (in supine), bridge, standing hip abduction SLR, partial squat    Consulted and Agree with Plan of Care  Patient       Patient will benefit from skilled therapeutic intervention in order to improve the following deficits and impairments:  Pain, Obesity, Decreased activity tolerance, Improper body mechanics, Postural dysfunction, Decreased strength, Decreased endurance  Visit Diagnosis: Pain in right hip  Pain in left hip  Muscle weakness (generalized)     Problem List Patient Active Problem List   Diagnosis Date Noted  . History of total knee replacement, left 04/03/2019  . History of total knee arthroplasty, right 04/03/2019  . DERMATOFIBROMA 10/26/2007  . ANEMIA, CHRONIC 10/26/2007  . SICKLE CELL TRAIT 10/26/2007  . ESSENTIAL HYPERTENSION 10/26/2007  . GASTROESOPHAGEAL REFLUX DISEASE 10/26/2007  . GASTRITIS 10/26/2007  . HIATAL HERNIA 10/26/2007  . CONSTIPATION, CHRONIC 10/26/2007  . GUAIAC POSITIVE STOOL 10/26/2007    Beaulah Dinning, PT, DPT 05/11/19 3:52 PM  [facility] [address] [place], [place], [address] Phone: [phone]   Fax:  [phone]  Name: Taylor Edwards MRN: [id number] Date of Birth: [birth year]

## 2019-05-11 NOTE — Patient Instructions (Signed)

## 2019-05-12 ENCOUNTER — Encounter: Payer: Medicare Other | Admitting: Physical Therapy

## 2019-05-15 ENCOUNTER — Encounter: Payer: Medicare Other | Admitting: Physical Therapy

## 2019-05-15 ENCOUNTER — Ambulatory Visit: Payer: BC Managed Care – PPO | Admitting: Orthopaedic Surgery

## 2019-05-15 ENCOUNTER — Ambulatory Visit (INDEPENDENT_AMBULATORY_CARE_PROVIDER_SITE_OTHER): Payer: BC Managed Care – PPO | Admitting: Orthopaedic Surgery

## 2019-05-15 ENCOUNTER — Encounter: Payer: Self-pay | Admitting: Orthopaedic Surgery

## 2019-05-15 ENCOUNTER — Other Ambulatory Visit: Payer: Self-pay

## 2019-05-15 DIAGNOSIS — M25562 Pain in left knee: Secondary | ICD-10-CM | POA: Diagnosis not present

## 2019-05-15 DIAGNOSIS — M25561 Pain in right knee: Secondary | ICD-10-CM

## 2019-05-15 DIAGNOSIS — G8929 Other chronic pain: Secondary | ICD-10-CM

## 2019-05-15 DIAGNOSIS — Z96652 Presence of left artificial knee joint: Secondary | ICD-10-CM | POA: Diagnosis not present

## 2019-05-15 DIAGNOSIS — Z96651 Presence of right artificial knee joint: Secondary | ICD-10-CM | POA: Diagnosis not present

## 2019-05-15 NOTE — Progress Notes (Signed)
The patient comes for follow-up after having a three-phase bone scan of to rule out any prosthetic loosening of her right total knee arthroplasty.  She has a history of bilateral knee replacements that were done in 2011 by one of my colleagues in town.  She had done well for a long period time but then had a mechanical fall injuring her right knee.  It stays chronically swollen now.  She also has developed some lesions that she says are more acute around her lower leg.  She feels like this may be a metal toxicity because she was told this by a person performing her bone scan.  I did tell her that metal toxicity usually occurs right after joint replacement if someone has an allergy to nickel or other metals but that that would look like a contained red rash around the knee incision and the knee joint itself.  On examination she does have some chronic swelling of her right knee and some slight warmth but her knee is ligamentously stable and has good range of motion.  She does have lateral tenderness still.  She did show me several dark spots on her leg which were about 5 or 6 circular areas.  They almost look like moles.  She says they are new.  I absolutely feel that a dermatologic referral is necessary for these lesions on her right leg.  They are not present on her left leg and she states they are new.  It be worthwhile getting a dermatologic assessment and having an opinion of a dermatologist of what they feel this may be.  I told her I do not feel this is a metal toxicity based on how far she is out from her initial surgery and the fact this is not affect her other knee.  Also this is not how mild allergies usually look after knee replacement surgery.  I would like to obtain an MRI at this point of her right knee to assess for any type of fracture or fluid collections they can shed some light on why that knee stay swollen compared to her left total knee arthroplasty which is not as well.  We can also make  sure there is no collateral ligament injury given the lateral tenderness.  She will call us when she has an MRI scheduled for her right knee and come and see [place] at least 2 days after that.

## 2019-05-16 ENCOUNTER — Ambulatory Visit: Payer: BC Managed Care – PPO

## 2019-05-17 ENCOUNTER — Other Ambulatory Visit: Payer: Self-pay

## 2019-05-17 ENCOUNTER — Ambulatory Visit
Admission: RE | Admit: 2019-05-17 | Discharge: 2019-05-17 | Disposition: A | Discharge status: Home / Self Care | Payer: BC Managed Care – PPO | Source: Ambulatory Visit | Attending: Geriatric Medicine | Admitting: Geriatric Medicine

## 2019-05-17 DIAGNOSIS — Z1231 Encounter for screening mammogram for malignant neoplasm of breast: Secondary | ICD-10-CM

## 2019-05-18 ENCOUNTER — Encounter: Payer: Medicare Other | Admitting: Physical Therapy

## 2019-05-22 ENCOUNTER — Ambulatory Visit: Payer: BC Managed Care – PPO | Admitting: Physical Therapy

## 2019-05-22 ENCOUNTER — Encounter: Payer: Medicare Other | Admitting: Physical Therapy

## 2019-05-25 ENCOUNTER — Encounter: Payer: Medicare Other | Admitting: Physical Therapy

## 2019-05-25 ENCOUNTER — Ambulatory Visit: Payer: BC Managed Care – PPO | Attending: Orthopaedic Surgery | Admitting: Physical Therapy

## 2019-05-25 ENCOUNTER — Other Ambulatory Visit: Payer: Self-pay

## 2019-05-25 ENCOUNTER — Encounter: Payer: Self-pay | Admitting: Physical Therapy

## 2019-05-25 DIAGNOSIS — M25551 Pain in right hip: Secondary | ICD-10-CM | POA: Insufficient documentation

## 2019-05-25 DIAGNOSIS — M25552 Pain in left hip: Secondary | ICD-10-CM | POA: Diagnosis present

## 2019-05-25 DIAGNOSIS — M6281 Muscle weakness (generalized): Secondary | ICD-10-CM | POA: Diagnosis present

## 2019-05-25 NOTE — Therapy (Signed)
[place], [place], [address] Phone: [phone]   Fax:  [phone]  Physical Therapy Treatment Progress Note Reporting Period 04/06/2019 to 05/25/2019  See note below for Objective Data and Assessment of Progress/Goals.       Patient Details  Name: Taylor Edwards MRN: [id number] Date of Birth: Sep 27, 1940 Referring Provider (PT): Jean Rosenthal MD   Encounter Date: 05/25/2019  PT End of Session - 05/25/19 1523    Visit Number  4    Number of Visits  15    Date for PT Re-Evaluation  07/06/19    Authorization Type  BCBS, [facility] Medicare, next progress note at visit 13, Kx at 15    PT Start Time  1456    PT Stop Time  1540   5 min spent dry needling not included in direct minutes   PT Time Calculation (min)  44 min    Activity Tolerance  Patient tolerated treatment well    Behavior During Therapy  [facility] for tasks assessed/performed       Past Medical History:  Diagnosis Date  . Anemia    no current med.  . Arthritis    knees  . Asthma    no current meds.  . Cataract of both eyes    early  . Diabetes mellitus    only taking natural supplements  . GERD (gastroesophageal reflux disease)    no current med.  . Hypertension    has been on med. x 10 yrs.  . IBS (irritable bowel syndrome)   . Post-nasal drip    current cough  . Sickle cell trait ([place])   . Urinary urgency     Past Surgical History:  Procedure Laterality Date  . ABDOMINAL HYSTERECTOMY    . BUNIONECTOMY  08/25/2011   Procedure: [name];  Surgeon: Hessie Dibble, MD;  Location: [place];  Service: Orthopedics;  Laterality: Right;  . CATARACT EXTRACTION     both eyes  . COLONOSCOPY W/ BIOPSIES AND POLYPECTOMY    . DILATION AND CURETTAGE OF UTERUS    . INJECTION KNEE  08/25/2011   Procedure: KNEE INJECTION;  Surgeon: Hessie Dibble, MD;  Location: [place];  Service:  Orthopedics;  Laterality: Bilateral;  . JOINT REPLACEMENT    . KNEE ARTHROSCOPY  12/11/2008   bilat., with chondroplasty  . KNEE JOINT MANIPULATION  10/30/2009   bilat.  . OPEN REDUCTION INTERNAL FIXATION (ORIF) DISTAL RADIAL FRACTURE Left 11/17/2014   Procedure: OPEN REDUCTION INTERNAL FIXATION (ORIF) LEFT DISTAL RADIUS  FRACTURE;  Surgeon: Charlotte Crumb, MD;  Location: [place];  Service: Orthopedics;  Laterality: Left;  . TOTAL KNEE ARTHROPLASTY  09/26/2009   bilat.    There were no vitals filed for this visit.  Subjective Assessment - 05/25/19 1454    Subjective  Pt. saw Dr. Ninfa Linden for follow up re: knee s/p TKA as well as "spots" on leg-he did not think these were associated with metal toxicity/reaction given presentation and timeframe after surgery so pt. has been referred to dermatologist to assess. MRI has also been ordered for knee given ongoing swelling and pain. Attendance has been limited to 4 visits (including today's visit) for hip pain. Pt. reports some improvement with symptoms since starting but continues with pain today 3/10 in both (lateral) hips.    How long can you sit comfortably?  3-4 hours    How long can you stand comfortably?  1 hour  How long can you walk comfortably?  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  Yes    Pain Score  3     Pain Location  Hip    Pain Orientation  Right;Left;Lateral    Pain Descriptors / Indicators  Aching    Pain Type  Chronic pain    Pain Onset  More than a month ago    Pain Frequency  Intermittent    Aggravating Factors   standing and walking    Pain Relieving Factors  rest vs. movement    Effect of Pain on Daily Activities  limits tolerance for standing and walking         [facility] PT Assessment - 05/25/19 0001      Observation/Other Assessments   Focus on Therapeutic Outcomes (FOTO)    63% limited      Strength   Right Hip Flexion  4/5    Right Hip Extension  4/5    Right Hip ABduction  4/5    Left Hip Flexion  4/5    Left Hip Extension  4-/5    Left Hip ABduction  4-/5    Right Knee Flexion  5/5    Right Knee Extension  4+/5    Left Knee Flexion  5/5    Left Knee Extension  5/5                   [facility] Adult PT Treatment/Exercise - 05/25/19 0001      Knee/Hip Exercises: Stretches   ITB Stretch  Right;Left;3 reps;20 seconds    Piriformis Stretch  Right;Left;3 reps;30 seconds      Knee/Hip Exercises: Standing   Abduction Limitations  2x10 bilat. with green Theraband    Functional Squat Limitations  partial squat x 15 reps at counter      Knee/Hip Exercises: Supine   Bridges with [employer]  Strengthening;Both;15 reps    Other Supine Knee/Hip Exercises  clamshell with green band 2x10      Manual Therapy   Soft tissue mobilization  STM/IASTM bilateral gluteus medius, TFL and IT band/vastus lateralis       Trigger Point Dry Needling - 05/25/19 0001    Consent Given?  Yes    Muscles Treated Back/Hip  Gluteus medius    Dry Needling Comments  brief needling bilat.            PT Education - 05/25/19 1522    Education Details  HEP, POC    Person(s) Educated  Patient    Methods  Explanation    Comprehension  Verbalized understanding       PT Short Term Goals - 05/25/19 1525      PT SHORT TERM GOAL #1   Title  pt to be I with inital HEP    Baseline  met    Time  3    Period  Weeks    Status  Achieved        PT Long Term Goals - 05/25/19 1525      PT LONG TERM GOAL #1   Title  pt to increase bil hip gross strength to >/= 4+/5 in all planes to promote hip knee stability with walking/ standing    Baseline  see objective-progressing    Time  6    Period  Weeks    Status  Partially Met    Target Date  07/06/19      PT LONG TERM GOAL #2   Title  pt to be able to naviage >/= 18 steps reciprocally with no report of pain or  limation    Baseline  still with limitation    Time  6    Period  Weeks    Status  On-going    Target Date  07/06/19      PT LONG TERM GOAL #3   Title  pt to increase FOTO score </= 49% limited to demo improvement in function    Baseline  63% limited    Time  6    Period  Weeks    Status  On-going    Target Date  07/06/19      PT LONG TERM GOAL #4   Title  pt to be able to be I with all HEP given as of last visit to maintaining and progress current level of function    Time  6    Period  Weeks    Status  On-going    Target Date  07/06/19            Plan - 05/25/19 1527    Clinical Impression Statement  Fair progress with hip pain from baseline status with decreased pain and associated strength gains. Status for hips complicated by issues with knee s/p TKA pending MRI as well as limited attendance for therapy. Plan continue PT for a few more visits for further progress to address functional limitations for mobility.    Personal Factors and Comorbidities  Age;Comorbidity 3+    Comorbidities  significant Surgical PMHx, hx of arthrits, DM, sickle cell trait    Examination-Activity Limitations  Stairs    Stability/Clinical Decision Making  Evolving/Moderate complexity    Clinical Decision Making  Moderate    Rehab Potential  Good    PT Frequency  2x / week    PT Duration  6 weeks    PT Treatment/Interventions  ADLs/Self Care Home Management;Cryotherapy;Electrical Stimulation;Moist Heat;Ultrasound;Therapeutic activities;Therapeutic exercise;Manual techniques;Passive range of motion;Dry needling;Taping    PT Next Visit Plan  check response to dry needling, review / update HEP as needed, STW along glute medius/ vastus lateralis, hip/ knee strengthening, stair training,    PT Home Exercise Plan  supine clam, SLR with sustained quad set, ITB stretching (in supine), bridge, standing hip abduction SLR, partial squat    Consulted and Agree with Plan of Care  Patient       Patient  will benefit from skilled therapeutic intervention in order to improve the following deficits and impairments:  Pain, Obesity, Decreased activity tolerance, Improper body mechanics, Postural dysfunction, Decreased strength, Decreased endurance  Visit Diagnosis: Pain in right hip  Pain in left hip  Muscle weakness (generalized)     Problem List Patient Active Problem List   Diagnosis Date Noted  . History of total knee replacement, left 04/03/2019  . History of total knee arthroplasty, right 04/03/2019  . DERMATOFIBROMA 10/26/2007  . ANEMIA, CHRONIC 10/26/2007  . SICKLE CELL TRAIT 10/26/2007  . ESSENTIAL HYPERTENSION 10/26/2007  . GASTROESOPHAGEAL REFLUX DISEASE 10/26/2007  . GASTRITIS 10/26/2007  . HIATAL HERNIA 10/26/2007  . CONSTIPATION, CHRONIC 10/26/2007  . GUAIAC POSITIVE STOOL 10/26/2007   Beaulah Dinning, PT, DPT 05/25/19 3:49 PM   [facility] [address] [place], [place], [address] Phone: [phone]  Fax:  [phone]  Name: Taylor Edwards MRN: [id number] Date of Birth: [birth year]

## 2019-05-30 ENCOUNTER — Encounter: Payer: Medicare Other | Admitting: Physical Therapy

## 2019-05-31 ENCOUNTER — Other Ambulatory Visit: Payer: Self-pay

## 2019-05-31 ENCOUNTER — Encounter: Payer: Self-pay | Admitting: Physical Therapy

## 2019-05-31 ENCOUNTER — Ambulatory Visit: Payer: BC Managed Care – PPO | Admitting: Physical Therapy

## 2019-05-31 DIAGNOSIS — M25552 Pain in left hip: Secondary | ICD-10-CM

## 2019-05-31 DIAGNOSIS — M25551 Pain in right hip: Secondary | ICD-10-CM

## 2019-05-31 DIAGNOSIS — M6281 Muscle weakness (generalized): Secondary | ICD-10-CM

## 2019-05-31 NOTE — Therapy (Signed)
[place], [place], [address] Phone: [phone]   Fax:  [phone]  Physical Therapy Treatment  Patient Details  Name: Taylor Taylor MRN: [id number] Date of Birth: [birth year] Referring Provider (PT): Jean Rosenthal MD   Encounter Date: 05/31/2019  PT End of Session - 05/31/19 1526    Visit Number  5    Number of Visits  15    Date for PT Re-Evaluation  07/06/19    Authorization Type  BCBS, [facility] Medicare, next progress note at visit 13, Kx at 15    PT Start Time  1500    PT Stop Time  1538    PT Time Calculation (min)  38 min    Activity Tolerance  Patient tolerated treatment well    Behavior During Therapy  [facility] for tasks assessed/performed       Past Medical History:  Diagnosis Date  . Anemia    no current med.  . Arthritis    knees  . Asthma    no current meds.  . Cataract of both eyes    early  . Diabetes mellitus    only taking natural supplements  . GERD (gastroesophageal reflux disease)    no current med.  . Hypertension    has been on med. x 10 yrs.  . IBS (irritable bowel syndrome)   . Post-nasal drip    current cough  . Sickle cell trait ([place])   . Urinary urgency     Past Surgical History:  Procedure Laterality Date  . ABDOMINAL HYSTERECTOMY    . BUNIONECTOMY  08/25/2011   Procedure: [name];  Surgeon: Hessie Dibble, MD;  Location: [place];  Service: Orthopedics;  Laterality: Right;  . CATARACT EXTRACTION     both eyes  . COLONOSCOPY W/ BIOPSIES AND POLYPECTOMY    . DILATION AND CURETTAGE OF UTERUS    . INJECTION KNEE  08/25/2011   Procedure: KNEE INJECTION;  Surgeon: Hessie Dibble, MD;  Location: [place];  Service: Orthopedics;  Laterality: Bilateral;  . JOINT REPLACEMENT    . KNEE ARTHROSCOPY  12/11/2008   bilat., with chondroplasty  . KNEE JOINT MANIPULATION  10/30/2009   bilat.  . OPEN REDUCTION INTERNAL  FIXATION (ORIF) DISTAL RADIAL FRACTURE Left 11/17/2014   Procedure: OPEN REDUCTION INTERNAL FIXATION (ORIF) LEFT DISTAL RADIUS  FRACTURE;  Surgeon: Charlotte Crumb, MD;  Location: [place];  Service: Orthopedics;  Laterality: Left;  . TOTAL KNEE ARTHROPLASTY  09/26/2009   bilat.    There were no vitals filed for this visit.  Subjective Assessment - 05/31/19 1500    Subjective  "My hips are still bothering me"-pt. reports still sore after last visit and is uncertain if this relates to dry needling. She is scheduled for MRI for her right knee next week with continued c/o right knee swelling.    Currently in Pain?  Yes    Pain Score  7     Pain Location  Heel    Pain Orientation  Right;Left;Lateral    Pain Descriptors / Indicators  Aching    Pain Type  Chronic pain    Pain Onset  More than a month ago    Pain Frequency  Intermittent    Aggravating Factors   standing and walking    Pain Relieving Factors  rest vs. movement    Effect of Pain on Daily Activities  limits tolerance for standing and walking  [facility] Adult PT Treatment/Exercise - 05/31/19 0001      Knee/Hip Exercises: Stretches   Passive Hamstring Stretch  Right;Left;3 reps;30 seconds    ITB Stretch  Right;Left;3 reps;20 seconds    Piriformis Stretch  Right;Left;3 reps;30 seconds    Other Knee/Hip Stretches  HEP instruction with brief practice standing IT band/TFL stretch at counter      Knee/Hip Exercises: Standing   Abduction Limitations  2x10 ea. bilat. with 2 lb. ankle weights    Lateral Step Up Limitations  alt. step up/over 4" step x 15 reps bilat.    Forward Step Up  Both;1 set;10 reps;Hand Hold: 1;Step Height: 6"    Functional Squat Limitations  partial squat x 15 reps with blue Theraband proximal to knees    Other Standing Knee Exercises  hip hike with LE on 4" step x 10 ea. bilat.      Knee/Hip Exercises: Seated   Clamshell with TheraBand  Blue   2x10     Knee/Hip Exercises:  Supine   Bridges Limitations  2x10 with legs on reversed incline wedge    Other Supine Knee/Hip Exercises  clamshell 2x10 with blue band      Manual Therapy   Soft tissue mobilization  STM/IASTM bilateral gluteus medius, TFL and IT band/vastus lateralis             PT Education - 05/31/19 1526    Education Details  symptoms etiology/anatomy    Person(s) Educated  Patient    Methods  Explanation;Demonstration    Comprehension  Verbalized understanding;Returned demonstration       PT Short Term Goals - 05/25/19 1525      PT SHORT TERM GOAL #1   Title  pt to be I with inital HEP    Baseline  met    Time  3    Period  Weeks    Status  Achieved        PT Long Term Goals - 05/25/19 1525      PT LONG TERM GOAL #1   Title  pt to increase bil hip gross strength to >/= 4+/5 in all planes to promote hip knee stability with walking/ standing    Baseline  see objective-progressing    Time  6    Period  Weeks    Status  Partially Met    Target Date  07/06/19      PT LONG TERM GOAL #2   Title  pt to be able to naviage >/= 18 steps reciprocally with no report of pain or limation    Baseline  still with limitation    Time  6    Period  Weeks    Status  On-going    Target Date  07/06/19      PT LONG TERM GOAL #3   Title  pt to increase FOTO score </= 49% limited to demo improvement in function    Baseline  63% limited    Time  6    Period  Weeks    Status  On-going    Target Date  07/06/19      PT LONG TERM GOAL #4   Title  pt to be able to be I with all HEP given as of last visit to maintaining and progress current level of function    Time  6    Period  Weeks    Status  On-going    Target Date  07/06/19  Plan - 05/31/19 1533    Clinical Impression Statement  Fair status with progress given ongoing hip pain. Given dry needling was very minimal last visit without any needle manipulation would suspect current pain more consistent with original hip  bursitis/tendinopathy symptoms which given duration symptoms and complicating factors from knee pain, limited attendance to date expect progress will be gradual/ongoing.    Personal Factors and Comorbidities  Age;Comorbidity 3+    Comorbidities  significant Surgical PMHx, hx of arthrits, DM, sickle cell trait    Examination-Activity Limitations  Stairs    Stability/Clinical Decision Making  Evolving/Moderate complexity    Clinical Decision Making  Moderate    Rehab Potential  Good    PT Frequency  2x / week    PT Duration  6 weeks    PT Next Visit Plan  Further dry needling? STW along glute medius/ vastus lateralis, hip/ knee strengthening, stair training, possible [place] lateral hip as needed    PT Home Exercise Plan  supine clam, SLR with sustained quad set, ITB stretching (in supine vs. standing alternative), bridge, standing hip abduction SLR, partial squat    Consulted and Agree with Plan of Care  Patient       Patient will benefit from skilled therapeutic intervention in order to improve the following deficits and impairments:  Pain, Obesity, Decreased activity tolerance, Improper body mechanics, Postural dysfunction, Decreased strength, Decreased endurance  Visit Diagnosis: Pain in right hip  Pain in left hip  Muscle weakness (generalized)     Problem List Patient Active Problem List   Diagnosis Date Noted  . History of total knee replacement, left 04/03/2019  . History of total knee arthroplasty, right 04/03/2019  . DERMATOFIBROMA 10/26/2007  . ANEMIA, CHRONIC 10/26/2007  . SICKLE CELL TRAIT 10/26/2007  . ESSENTIAL HYPERTENSION 10/26/2007  . GASTROESOPHAGEAL REFLUX DISEASE 10/26/2007  . GASTRITIS 10/26/2007  . HIATAL HERNIA 10/26/2007  . CONSTIPATION, CHRONIC 10/26/2007  . GUAIAC POSITIVE STOOL 10/26/2007    Beaulah Dinning, PT, DPT 05/31/19 3:43 PM  [place] [facility] [address] [place], [place], [address] Phone:  [phone]   Fax:  [phone]  Name: Taylor Taylor MRN: [id number] Date of Birth: 04/22/1941

## 2019-06-01 ENCOUNTER — Encounter: Payer: Self-pay | Admitting: Physical Therapy

## 2019-06-01 ENCOUNTER — Encounter: Payer: Medicare Other | Admitting: Physical Therapy

## 2019-06-01 ENCOUNTER — Ambulatory Visit: Payer: BC Managed Care – PPO | Admitting: Physical Therapy

## 2019-06-01 DIAGNOSIS — M25551 Pain in right hip: Secondary | ICD-10-CM | POA: Diagnosis not present

## 2019-06-01 DIAGNOSIS — M25552 Pain in left hip: Secondary | ICD-10-CM

## 2019-06-01 DIAGNOSIS — M6281 Muscle weakness (generalized): Secondary | ICD-10-CM

## 2019-06-01 NOTE — Therapy (Signed)
[place], [place], [address] Phone: [phone]   Fax:  [phone]  Physical Therapy Treatment  Patient Details  Name: Taylor Edwards MRN: [id number] Date of Birth: 1940-11-29 Referring Provider (PT): Jean Rosenthal MD   Encounter Date: 06/01/2019  PT End of Session - 06/01/19 1541    Visit Number  6    Number of Visits  15    Date for PT Re-Evaluation  07/06/19    Authorization Type  BCBS, [employer] [facility] Medicare, next progress note at visit 13, Kx at 15    PT Start Time  1500    PT Stop Time  1539    PT Time Calculation (min)  39 min    Activity Tolerance  Patient tolerated treatment well    Behavior During Therapy  [facility] for tasks assessed/performed       Past Medical History:  Diagnosis Date  . Anemia    no current med.  . Arthritis    knees  . Asthma    no current meds.  . Cataract of both eyes    early  . Diabetes mellitus    only taking natural supplements  . GERD (gastroesophageal reflux disease)    no current med.  . Hypertension    has been on med. x 10 yrs.  . IBS (irritable bowel syndrome)   . Post-nasal drip    current cough  . Sickle cell trait ([place])   . Urinary urgency     Past Surgical History:  Procedure Laterality Date  . ABDOMINAL HYSTERECTOMY    . BUNIONECTOMY  08/25/2011   Procedure: [name];  Surgeon: Hessie Dibble, MD;  Location: [place];  Service: Orthopedics;  Laterality: Right;  . CATARACT EXTRACTION     both eyes  . COLONOSCOPY W/ BIOPSIES AND POLYPECTOMY    . DILATION AND CURETTAGE OF UTERUS    . INJECTION KNEE  08/25/2011   Procedure: KNEE INJECTION;  Surgeon: Hessie Dibble, MD;  Location: [place];  Service: Orthopedics;  Laterality: Bilateral;  . JOINT REPLACEMENT    . KNEE ARTHROSCOPY  12/11/2008   bilat., with chondroplasty  . KNEE JOINT MANIPULATION  10/30/2009   bilat.  . OPEN REDUCTION INTERNAL  FIXATION (ORIF) DISTAL RADIAL FRACTURE Left 11/17/2014   Procedure: OPEN REDUCTION INTERNAL FIXATION (ORIF) LEFT DISTAL RADIUS  FRACTURE;  Surgeon: Charlotte Crumb, MD;  Location: [place];  Service: Orthopedics;  Laterality: Left;  . TOTAL KNEE ARTHROPLASTY  09/26/2009   bilat.    There were no vitals filed for this visit.  Subjective Assessment - 06/01/19 1500    Subjective  Pt. reports was very sore after last session and notes was also sore after doing HEP, with work duties. She c/o "knots" in her lateral thighs bilaterally and also reports she had some swelling in an area of the front of her right shin.                       [place] Adult PT Treatment/Exercise - 06/01/19 0001      Knee/Hip Exercises: Stretches   Quad Stretch  Right;Left;3 reps;30 seconds    ITB Stretch  Right;Left;3 reps;20 seconds    Piriformis Stretch  Right;Left;3 reps;30 seconds      Knee/Hip Exercises: Seated   Clamshell with TheraBand  Blue   2x10     Knee/Hip Exercises: Supine   Bridges with [employer]  Strengthening;Both;15 reps  Other Supine Knee/Hip Exercises  clamshell 2x10 with blue band      Manual Therapy   Manual Therapy  Joint mobilization    Joint Mobilization  LAD bilat. hips grade III-IV oscillations    Soft tissue mobilization  STM/IASTM bilateral gluteus medius, TFL and IT band/vastus lateralis             PT Education - 06/01/19 1541    Education Details  POC, HEP    Person(s) Educated  Patient    Methods  Explanation    Comprehension  Verbalized understanding       PT Short Term Goals - 05/25/19 1525      PT SHORT TERM GOAL #1   Title  pt to be I with inital HEP    Baseline  met    Time  3    Period  Weeks    Status  Achieved        PT Long Term Goals - 05/25/19 1525      PT LONG TERM GOAL #1   Title  pt to increase bil hip gross strength to >/= 4+/5 in all planes to promote hip knee stability with walking/ standing    Baseline  see  objective-progressing    Time  6    Period  Weeks    Status  Partially Met    Target Date  07/06/19      PT LONG TERM GOAL #2   Title  pt to be able to naviage >/= 18 steps reciprocally with no report of pain or limation    Baseline  still with limitation    Time  6    Period  Weeks    Status  On-going    Target Date  07/06/19      PT LONG TERM GOAL #3   Title  pt to increase FOTO score </= 49% limited to demo improvement in function    Baseline  63% limited    Time  6    Period  Weeks    Status  On-going    Target Date  07/06/19      PT LONG TERM GOAL #4   Title  pt to be able to be I with all HEP given as of last visit to maintaining and progress current level of function    Time  6    Period  Weeks    Status  On-going    Target Date  07/06/19            Plan - 06/01/19 1535    Clinical Impression Statement  Held off on standing activities due to increased soreness today with more focus manual therapy and mat-based exercises. Relief noted with addition hip LAD. Given exercise progression at therapy yesterday would suspect increased soreness due to delayed onset muscle soreness and expect will improve with more time after sessions this week. Examined region of shin where pt. had reported swelling and unable to note any significant swelling. No significant tenderness and no calf pain but will continue to monitor. Region of discomfort from hip into lateral thigh/IT band diffuse today with minimal local tenderness at greater trochanteric region so did not attempt [place].    Personal Factors and Comorbidities  Age;Comorbidity 3+    Comorbidities  significant Surgical PMHx, hx of arthrits, DM, sickle cell trait    Examination-Activity Limitations  Stairs    Stability/Clinical Decision Making  Evolving/Moderate complexity    Clinical Decision Making  Moderate  Rehab Potential  Good    PT Frequency  2x / week    PT Duration  6 weeks    PT Treatment/Interventions  ADLs/Self Care  Home Management;Cryotherapy;Electrical Stimulation;Moist Heat;Ultrasound;Therapeutic activities;Therapeutic exercise;Manual techniques;Passive range of motion;Dry needling;Taping    PT Next Visit Plan  Further dry needling? STW along glute medius/ vastus lateralis, hip/ knee strengthening, stair training, possible [place] lateral hip as needed    PT Home Exercise Plan  supine clam, SLR with sustained quad set, ITB stretching (in supine vs. standing alternative), bridge, standing hip abduction SLR, partial squat    Consulted and Agree with Plan of Care  Patient       Patient will benefit from skilled therapeutic intervention in order to improve the following deficits and impairments:  Pain, Obesity, Decreased activity tolerance, Improper body mechanics, Postural dysfunction, Decreased strength, Decreased endurance  Visit Diagnosis: Pain in right hip  Pain in left hip  Muscle weakness (generalized)     Problem List Patient Active Problem List   Diagnosis Date Noted  . History of total knee replacement, left 04/03/2019  . History of total knee arthroplasty, right 04/03/2019  . DERMATOFIBROMA 10/26/2007  . ANEMIA, CHRONIC 10/26/2007  . SICKLE CELL TRAIT 10/26/2007  . ESSENTIAL HYPERTENSION 10/26/2007  . GASTROESOPHAGEAL REFLUX DISEASE 10/26/2007  . GASTRITIS 10/26/2007  . HIATAL HERNIA 10/26/2007  . CONSTIPATION, CHRONIC 10/26/2007  . GUAIAC POSITIVE STOOL 10/26/2007   Beaulah Dinning, PT, DPT 06/01/19 3:53 PM  [facility] [address] [place], [place], [address] Phone: [phone]   Fax:  [phone]  Name: Jaeleigh Monaco MRN: [id number] Date of Birth: 07-04-1941

## 2019-06-06 ENCOUNTER — Other Ambulatory Visit: Payer: Self-pay

## 2019-06-06 ENCOUNTER — Ambulatory Visit: Payer: BC Managed Care – PPO | Admitting: Physical Therapy

## 2019-06-06 ENCOUNTER — Encounter: Payer: Self-pay | Admitting: Physical Therapy

## 2019-06-06 DIAGNOSIS — M6281 Muscle weakness (generalized): Secondary | ICD-10-CM

## 2019-06-06 DIAGNOSIS — M25551 Pain in right hip: Secondary | ICD-10-CM | POA: Diagnosis not present

## 2019-06-06 DIAGNOSIS — M25552 Pain in left hip: Secondary | ICD-10-CM

## 2019-06-06 NOTE — Therapy (Signed)
[place], [place], [address] Phone: [phone]   Fax:  [phone]  Physical Therapy Treatment  Patient Details  Name: Taylor Edwards MRN: [id number] Date of Birth: Aug 12, 1941 Referring Provider (PT): Jean Rosenthal MD   Encounter Date: 06/06/2019  PT End of Session - 06/06/19 1457    Visit Number  7    Number of Visits  15    Date for PT Re-Evaluation  07/06/19    Authorization Type  BCBS, [facility] Medicare, next progress note at visit 13, Kx at 15    PT Start Time  1458    PT Stop Time  1540    PT Time Calculation (min)  42 min    Activity Tolerance  Patient tolerated treatment well    Behavior During Therapy  [facility] for tasks assessed/performed       Past Medical History:  Diagnosis Date  . Anemia    no current med.  . Arthritis    knees  . Asthma    no current meds.  . Cataract of both eyes    early  . Diabetes mellitus    only taking natural supplements  . GERD (gastroesophageal reflux disease)    no current med.  . Hypertension    has been on med. x 10 yrs.  . IBS (irritable bowel syndrome)   . Post-nasal drip    current cough  . Sickle cell trait ([place])   . Urinary urgency     Past Surgical History:  Procedure Laterality Date  . ABDOMINAL HYSTERECTOMY    . BUNIONECTOMY  08/25/2011   Procedure: [name];  Surgeon: Hessie Dibble, MD;  Location: [place];  Service: Orthopedics;  Laterality: Right;  . CATARACT EXTRACTION     both eyes  . COLONOSCOPY W/ BIOPSIES AND POLYPECTOMY    . DILATION AND CURETTAGE OF UTERUS    . INJECTION KNEE  08/25/2011   Procedure: KNEE INJECTION;  Surgeon: Hessie Dibble, MD;  Location: [place];  Service: Orthopedics;  Laterality: Bilateral;  . JOINT REPLACEMENT    . KNEE ARTHROSCOPY  12/11/2008   bilat., with chondroplasty  . KNEE JOINT MANIPULATION  10/30/2009   bilat.  . OPEN REDUCTION INTERNAL  FIXATION (ORIF) DISTAL RADIAL FRACTURE Left 11/17/2014   Procedure: OPEN REDUCTION INTERNAL FIXATION (ORIF) LEFT DISTAL RADIUS  FRACTURE;  Surgeon: Charlotte Crumb, MD;  Location: [place];  Service: Orthopedics;  Laterality: Left;  . TOTAL KNEE ARTHROPLASTY  09/26/2009   bilat.    There were no vitals filed for this visit.  Subjective Assessment - 06/06/19 1458    Subjective  " I think I have the knee pain under control. I stil ahve 3-4/10 pain inthe R hip that is worse with standing. 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  Yes    Pain Score  2     Pain Location  Hip    Pain Orientation  Right    Pain Type  Chronic pain    Pain Onset  More than a month ago    Pain Frequency  Intermittent    Aggravating Factors   standing/ walking , laying down,         [facility] PT Assessment -  06/06/19 0001      Assessment   Medical Diagnosis  Trochanteric bursitis of both hips     Referring Provider (PT)  Jean Rosenthal MD                   [facility] Adult PT Treatment/Exercise - 06/06/19 0001      Knee/Hip Exercises: Aerobic   Nustep  L6 x 5 min LE only      Knee/Hip Exercises: Standing   Hip Abduction  2 sets;10 reps   3#   Hip Extension  2 sets;10 reps;Knee straight;Both;Stengthening   3#   Forward Step Up  2 sets;Step Height: 4";10 reps;Both   10 reps then changing lead leg   Step Down  2 sets;10 reps;Step Height: 4";Both   with bil HHA for stability from counter   Stairs  4 x up/down 6 inch steps with good form   no complaints of pain     Knee/Hip Exercises: Seated   Long Arc Quad  Both;2 sets   12   Long Arc Quad Weight  3 lbs.      Manual Therapy   Manual therapy comments  MTPR along the glute medius x 2 bil    Joint Mobilization  LAD bilat. hips grade III-IV oscillations    Soft tissue  mobilization  IASTM bil glute med/ min                PT Short Term Goals - 05/25/19 1525      PT SHORT TERM GOAL #1   Title  pt to be I with inital HEP    Baseline  met    Time  3    Period  Weeks    Status  Achieved        PT Long Term Goals - 05/25/19 1525      PT LONG TERM GOAL #1   Title  pt to increase bil hip gross strength to >/= 4+/5 in all planes to promote hip knee stability with walking/ standing    Baseline  see objective-progressing    Time  6    Period  Weeks    Status  Partially Met    Target Date  07/06/19      PT LONG TERM GOAL #2   Title  pt to be able to naviage >/= 18 steps reciprocally with no report of pain or limation    Baseline  still with limitation    Time  6    Period  Weeks    Status  On-going    Target Date  07/06/19      PT LONG TERM GOAL #3   Title  pt to increase FOTO score </= 49% limited to demo improvement in function    Baseline  63% limited    Time  6    Period  Weeks    Status  On-going    Target Date  07/06/19      PT LONG TERM GOAL #4   Title  pt to be able to be I with all HEP given as of last visit to maintaining and progress current level of function    Time  6    Period  Weeks    Status  On-going    Target Date  07/06/19            Plan - 06/06/19 1539    Clinical Impression Statement  Taylor Edwards is making good progress with physical therapy. continued STW  along bil glute medius/ minimus following MTPR techniques, pt opted to hold off on TPDN. focused on hip / knee strenghtening which she responded well to. She reported continued sorness along the L shin which was remarked on during the previous session but no visual swelling was observed and the calf was supple with no tenderness and monitored throughout session. pt noted no increase in pain throughout session.    PT Treatment/Interventions  ADLs/Self Care Home Management;Cryotherapy;Electrical Stimulation;Moist Heat;Ultrasound;Therapeutic  activities;Therapeutic exercise;Manual techniques;Passive range of motion;Dry needling;Taping    PT Next Visit Plan  Further dry needling? STW along glute medius/ vastus lateralis, hip/ knee strengthening, stair training, possible [place] lateral hip as needed    PT Home Exercise Plan  supine clam, SLR with sustained quad set, ITB stretching (in supine vs. standing alternative), bridge, standing hip abduction SLR, partial squat    Consulted and Agree with Plan of Care  Patient       Patient will benefit from skilled therapeutic intervention in order to improve the following deficits and impairments:  Pain, Obesity, Decreased activity tolerance, Improper body mechanics, Postural dysfunction, Decreased strength, Decreased endurance  Visit Diagnosis: Pain in right hip  Pain in left hip  Muscle weakness (generalized)     Problem List Patient Active Problem List   Diagnosis Date Noted  . History of total knee replacement, left 04/03/2019  . History of total knee arthroplasty, right 04/03/2019  . DERMATOFIBROMA 10/26/2007  . ANEMIA, CHRONIC 10/26/2007  . SICKLE CELL TRAIT 10/26/2007  . ESSENTIAL HYPERTENSION 10/26/2007  . GASTROESOPHAGEAL REFLUX DISEASE 10/26/2007  . GASTRITIS 10/26/2007  . HIATAL HERNIA 10/26/2007  . CONSTIPATION, CHRONIC 10/26/2007  . GUAIAC POSITIVE STOOL 10/26/2007   Starr Lake PT, DPT, LAT, ATC  06/06/19  3:44 PM      [facility] [address] [place], [place], [address] Phone: [phone]   Fax:  [phone]  Name: Taylor Edwards MRN: [id number] Date of Birth: 12/23/1940

## 2019-06-07 ENCOUNTER — Ambulatory Visit (HOSPITAL_COMMUNITY)
Admission: RE | Admit: 2019-06-07 | Discharge: 2019-06-07 | Disposition: A | Discharge status: Home / Self Care | Payer: BC Managed Care – PPO | Source: Ambulatory Visit | Attending: Orthopaedic Surgery | Admitting: Orthopaedic Surgery

## 2019-06-07 DIAGNOSIS — G8929 Other chronic pain: Secondary | ICD-10-CM

## 2019-06-07 DIAGNOSIS — M25561 Pain in right knee: Secondary | ICD-10-CM | POA: Diagnosis present

## 2019-06-08 ENCOUNTER — Ambulatory Visit (INDEPENDENT_AMBULATORY_CARE_PROVIDER_SITE_OTHER): Payer: BC Managed Care – PPO | Admitting: Orthopaedic Surgery

## 2019-06-08 ENCOUNTER — Encounter: Payer: Self-pay | Admitting: Orthopaedic Surgery

## 2019-06-08 DIAGNOSIS — M25561 Pain in right knee: Secondary | ICD-10-CM | POA: Diagnosis not present

## 2019-06-08 DIAGNOSIS — G8929 Other chronic pain: Secondary | ICD-10-CM

## 2019-06-08 DIAGNOSIS — Z96651 Presence of right artificial knee joint: Secondary | ICD-10-CM

## 2019-06-08 NOTE — Progress Notes (Signed)
The patient is returning for follow-up after having an MRI of her right knee.  She has a history of bilateral total knee arthroplasties done in 2011 x 1 of my colleagues in town.  She feels like the right knee is bigger than the left knee and was concerned about this.  She came to [place] for second opinion.  She does report chronic pain in that right knee.  A three-phase bone scan did not show any evidence of prosthetic loosening.  I did send her for an MRI to see if that would shed light of any type of effusion or edema around the knee that could also suggest any problems with the knee joint itself.  On exam she walks without assistive ice.  She has good range of motion of both her knees.  I agree that the right knee is slightly bigger than the left side but I see no evidence of instability on exam with good range of motion.  The MRI did not show any evidence of any large fluid collections or evidence of prosthetic loosening.  There is some nonspecific edema just lateral to the lateral femoral condyle that the IT band but otherwise no glaring deformities of knee replacement.  This point I told her one option would be to open the knee up and form a synovectomy and polyliner exchange and assist in components but right now I would recommend just observation and not pursue any other surgical intervention for her right knee based on what worsening on her studies and clinical exam.  All question concerns were answered addressed.  Follow-up with me as needed.

## 2019-06-15 ENCOUNTER — Telehealth: Payer: Self-pay | Admitting: Physical Therapy

## 2019-06-15 ENCOUNTER — Ambulatory Visit: Payer: BC Managed Care – PPO | Admitting: Physical Therapy

## 2019-06-15 NOTE — Telephone Encounter (Signed)
Called and spoke with patient regarding no show for 3 pm appointment-she reports forgot appointment/thought was scheduled for tomorrow. She will call back to reschedule.

## 2019-07-03 ENCOUNTER — Other Ambulatory Visit: Payer: Self-pay

## 2019-07-03 ENCOUNTER — Ambulatory Visit: Payer: BC Managed Care – PPO | Attending: Orthopaedic Surgery | Admitting: Physical Therapy

## 2019-07-03 ENCOUNTER — Encounter: Payer: Self-pay | Admitting: Physical Therapy

## 2019-07-03 DIAGNOSIS — M25551 Pain in right hip: Secondary | ICD-10-CM | POA: Diagnosis not present

## 2019-07-03 DIAGNOSIS — M25552 Pain in left hip: Secondary | ICD-10-CM | POA: Insufficient documentation

## 2019-07-03 DIAGNOSIS — M6281 Muscle weakness (generalized): Secondary | ICD-10-CM | POA: Diagnosis present

## 2019-07-03 NOTE — Therapy (Signed)
[place], [place], [address] Phone: [phone]   Fax:  [phone]  Physical Therapy Treatment Progress Note Reporting Period 04/06/2019 to 07/03/2019  See note below for Objective Data and Assessment of Progress/Goals.        Patient Details  Name: Taylor Edwards MRN: [id number] Date of Birth: [birth year] Referring Provider (PT): Jean Rosenthal MD   Encounter Date: 07/03/2019  PT End of Session - 07/03/19 1704    Visit Number  8    Number of Visits  19    Date for PT Re-Evaluation  08/14/19    Authorization Type  BCBS, [facility] Medicare, next progress note by visit 17    PT Start Time  1503    PT Stop Time  1542    PT Time Calculation (min)  39 min    Activity Tolerance  Patient tolerated treatment well    Behavior During Therapy  WFL for tasks assessed/performed       Past Medical History:  Diagnosis Date  . Anemia    no current med.  . Arthritis    knees  . Asthma    no current meds.  . Cataract of both eyes    early  . Diabetes mellitus    only taking natural supplements  . GERD (gastroesophageal reflux disease)    no current med.  . Hypertension    has been on med. x 10 yrs.  . IBS (irritable bowel syndrome)   . Post-nasal drip    current cough  . Sickle cell trait ([place])   . Urinary urgency     Past Surgical History:  Procedure Laterality Date  . ABDOMINAL HYSTERECTOMY    . BUNIONECTOMY  08/25/2011   Procedure: [name];  Surgeon: Hessie Dibble, MD;  Location: [place];  Service: Orthopedics;  Laterality: Right;  . CATARACT EXTRACTION     both eyes  . COLONOSCOPY W/ BIOPSIES AND POLYPECTOMY    . DILATION AND CURETTAGE OF UTERUS    . INJECTION KNEE  08/25/2011   Procedure: KNEE INJECTION;  Surgeon: Hessie Dibble, MD;  Location: [place];  Service: Orthopedics;  Laterality: Bilateral;  . JOINT REPLACEMENT    . KNEE  ARTHROSCOPY  12/11/2008   bilat., with chondroplasty  . KNEE JOINT MANIPULATION  10/30/2009   bilat.  . OPEN REDUCTION INTERNAL FIXATION (ORIF) DISTAL RADIAL FRACTURE Left 11/17/2014   Procedure: OPEN REDUCTION INTERNAL FIXATION (ORIF) LEFT DISTAL RADIUS  FRACTURE;  Surgeon: Charlotte Crumb, MD;  Location: [place];  Service: Orthopedics;  Laterality: Left;  . TOTAL KNEE ARTHROPLASTY  09/26/2009   bilat.    There were no vitals filed for this visit.  Subjective Assessment - 07/03/19 1507    Subjective  Pt. returns, not seen for therapy since 06/06/19. She had MRI of right knee and had follow up with Dr. Nicholas Lose issues with prosthesis noted. She also saw a dermatologist for previously reported skin spots in her lower leg and reports they are suspected to be insect bites. Pt. had been noting some improvement in her hip symptoms last month but reports pain has increased in the interim. No specific exacerbating activities or mechanism of injury other than pain with walking. She reports doing HEP about 2x/week. She wishes to resume/continue PT to try and help her hip pain. She reports right knee not hurting but still having some issues with right knee swelling and also reports having neuropathic pain associated with  her diabetes in her lower legs.    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  Yes    Pain Score  6     Pain Location  Hip    Pain Orientation  Left;Right;Lateral    Pain Descriptors / Indicators  Aching         [facility] PT Assessment - 07/03/19 0001      Observation/Other Assessments   Focus on Therapeutic Outcomes (FOTO)   62% limited      Strength   Right Hip Flexion  4/5    Right Hip Extension  4/5    Right Hip External Rotation   4/5    Right Hip Internal Rotation  5/5    Right Hip ABduction  4/5    Left Hip Flexion  4+/5    Left Hip Extension  4/5     Left Hip External Rotation  4+/5    Left Hip Internal Rotation  5/5    Left Hip ABduction  4/5    Right Knee Flexion  5/5    Right Knee Extension  5/5    Left Knee Flexion  5/5    Left Knee Extension  5/5                   [facility] Adult PT Treatment/Exercise - 07/03/19 0001      Knee/Hip Exercises: Stretches   ITB Stretch  Right;Left;3 reps;30 seconds    Piriformis Stretch  Left;3 reps;30 seconds    Other Knee/Hip Stretches  Figure 4 stretch 3x30 sec ea. bilat.      Manual Therapy   Soft tissue mobilization  STM/IASTM incl. roller use bilat. lateral hips, gluts, IT band             PT Education - 07/03/19 1704    Education Details  POC, hip anatomy and symptom etiology    Person(s) Educated  Patient    Methods  Explanation;Demonstration    Comprehension  Verbalized understanding       PT Short Term Goals - 07/03/19 1714      PT SHORT TERM GOAL #1   Title  pt to be I with inital HEP    Baseline  met    Time  3    Period  Weeks    Status  Achieved        PT Long Term Goals - 07/03/19 1714      PT LONG TERM GOAL #1   Title  pt to increase bil hip gross strength to >/= 4+/5 in all planes to promote hip knee stability with walking/ standing    Baseline  see objective-progressing    Time  6    Period  Weeks    Status  Partially Met    Target Date  08/14/19      PT LONG TERM GOAL #2   Title  pt to be able to naviage >/= 18 steps reciprocally with no report of pain or limation    Baseline  still with limitation    Time  6    Period  Weeks    Status  On-going    Target Date  08/14/19      PT LONG TERM GOAL #3   Title  pt to increase FOTO score </= 49% limited to  demo improvement in function    Baseline  62% limited    Time  6    Period  Weeks    Status  On-going    Target Date  08/14/19      PT LONG TERM GOAL #4   Title  pt to be able to be I with all HEP given as of last visit to maintaining and progress current level of function    Time   6    Period  Weeks    Status  On-going    Target Date  08/14/19            Plan - 07/03/19 1706    Clinical Impression Statement  Pt. returns after approximately 4 week absence from therapy with exacerbation of bilateral hip pain with associated lateral hip and IT band muscle tightness as well as muscle weakness. Findings still consistent with trochanteric bursitis vs. abductor tendinopathy, IT band pain. Status for improvement with therapy impacted by factors including symptom duration and limited therapy attendance. Pt. would benefit from resumed/continued therapy for further progress to help relieve pain and address current associated functional limitations.    Personal Factors and Comorbidities  Age;Comorbidity 3+    Comorbidities  significant Surgical PMHx, hx of arthrits, DM, sickle cell trait    Examination-Activity Limitations  Stairs    Stability/Clinical Decision Making  Evolving/Moderate complexity    Clinical Decision Making  Moderate    Rehab Potential  Good    PT Frequency  2x / week    PT Duration  6 weeks    PT Treatment/Interventions  ADLs/Self Care Home Management;Cryotherapy;Electrical Stimulation;Moist Heat;Ultrasound;Therapeutic activities;Therapeutic exercise;Manual techniques;Passive range of motion;Dry needling;Taping;Iontophoresis '4mg'$ /ml Dexamethasone;Functional mobility training;Neuromuscular re-education;Patient/family education    PT Next Visit Plan  if certification signed trial ionto, otherwise try [place] trochanteric region, hip and IT band stretches and strengthening, manual work/STM and joint mobs, further dry needling prn    PT Home Exercise Plan  supine clam, SLR with sustained quad set, ITB stretching (in supine vs. standing alternative), bridge, standing hip abduction SLR, partial squat    Consulted and Agree with Plan of Care  Patient       Patient will benefit from skilled therapeutic intervention in order to improve the following deficits and  impairments:  Pain, Obesity, Decreased activity tolerance, Improper body mechanics, Postural dysfunction, Decreased strength, Decreased endurance  Visit Diagnosis: Pain in right hip  Pain in left hip  Muscle weakness (generalized)     Problem List Patient Active Problem List   Diagnosis Date Noted  . History of total knee replacement, left 04/03/2019  . History of total knee arthroplasty, right 04/03/2019  . DERMATOFIBROMA 10/26/2007  . ANEMIA, CHRONIC 10/26/2007  . SICKLE CELL TRAIT 10/26/2007  . ESSENTIAL HYPERTENSION 10/26/2007  . GASTROESOPHAGEAL REFLUX DISEASE 10/26/2007  . GASTRITIS 10/26/2007  . HIATAL HERNIA 10/26/2007  . CONSTIPATION, CHRONIC 10/26/2007  . GUAIAC POSITIVE STOOL 10/26/2007    Beaulah Dinning, PT, DPT 07/03/19 5:17 PM    [facility] [address] [place], [place], [address] Phone: [phone]   Fax:  [phone]  Name: Taylor Edwards MRN: [id number] Date of Birth: 11/01/1940

## 2019-07-20 ENCOUNTER — Ambulatory Visit: Payer: BC Managed Care – PPO | Admitting: Physical Therapy

## 2019-07-24 ENCOUNTER — Other Ambulatory Visit: Payer: Self-pay

## 2019-07-24 ENCOUNTER — Encounter: Payer: Self-pay | Admitting: Physical Therapy

## 2019-07-24 ENCOUNTER — Ambulatory Visit: Payer: BC Managed Care – PPO | Attending: Orthopaedic Surgery | Admitting: Physical Therapy

## 2019-07-24 DIAGNOSIS — M25561 Pain in right knee: Secondary | ICD-10-CM | POA: Diagnosis present

## 2019-07-24 DIAGNOSIS — M25551 Pain in right hip: Secondary | ICD-10-CM | POA: Insufficient documentation

## 2019-07-24 DIAGNOSIS — M5442 Lumbago with sciatica, left side: Secondary | ICD-10-CM | POA: Diagnosis present

## 2019-07-24 DIAGNOSIS — M25552 Pain in left hip: Secondary | ICD-10-CM | POA: Diagnosis present

## 2019-07-24 DIAGNOSIS — M6281 Muscle weakness (generalized): Secondary | ICD-10-CM | POA: Diagnosis present

## 2019-07-24 DIAGNOSIS — G8929 Other chronic pain: Secondary | ICD-10-CM | POA: Insufficient documentation

## 2019-07-24 NOTE — Patient Instructions (Addendum)

## 2019-07-24 NOTE — Therapy (Signed)
[place], [place], [address] Phone: [phone]   Fax:  [phone]  Physical Therapy Treatment  Patient Details  Name: Taylor Edwards MRN: [id number] Date of Birth: [birth year] Referring Provider (PT): Jean Rosenthal MD   Encounter Date: 07/24/2019  PT End of Session - 07/24/19 1615    Visit Number  9    Number of Visits  19    Date for PT Re-Evaluation  08/14/19    Authorization Type  BCBS, [facility] Medicare, next progress note by visit 17    PT Start Time  1615    PT Stop Time  1655    PT Time Calculation (min)  40 min    Activity Tolerance  Patient tolerated treatment well    Behavior During Therapy  [facility] for tasks assessed/performed       Past Medical History:  Diagnosis Date  . Anemia    no current med.  . Arthritis    knees  . Asthma    no current meds.  . Cataract of both eyes    early  . Diabetes mellitus    only taking natural supplements  . GERD (gastroesophageal reflux disease)    no current med.  . Hypertension    has been on med. x 10 yrs.  . IBS (irritable bowel syndrome)   . Post-nasal drip    current cough  . Sickle cell trait ([place])   . Urinary urgency     Past Surgical History:  Procedure Laterality Date  . ABDOMINAL HYSTERECTOMY    . BUNIONECTOMY  08/25/2011   Procedure: [name];  Surgeon: Hessie Dibble, MD;  Location: [place];  Service: Orthopedics;  Laterality: Right;  . CATARACT EXTRACTION     both eyes  . COLONOSCOPY W/ BIOPSIES AND POLYPECTOMY    . DILATION AND CURETTAGE OF UTERUS    . INJECTION KNEE  08/25/2011   Procedure: KNEE INJECTION;  Surgeon: Hessie Dibble, MD;  Location: [place];  Service: Orthopedics;  Laterality: Bilateral;  . JOINT REPLACEMENT    . KNEE ARTHROSCOPY  12/11/2008   bilat., with chondroplasty  . KNEE JOINT MANIPULATION  10/30/2009   bilat.  . OPEN REDUCTION INTERNAL FIXATION (ORIF)  DISTAL RADIAL FRACTURE Left 11/17/2014   Procedure: OPEN REDUCTION INTERNAL FIXATION (ORIF) LEFT DISTAL RADIUS  FRACTURE;  Surgeon: Charlotte Crumb, MD;  Location: [place];  Service: Orthopedics;  Laterality: Left;  . TOTAL KNEE ARTHROPLASTY  09/26/2009   bilat.    There were no vitals filed for this visit.  Subjective Assessment - 07/24/19 1618    Subjective  "I am having increased pain inthe sides of my hips which is at about a 9/10"    Pain Score  9     Pain Orientation  Right;Left    Pain Type  Chronic pain    Pain Onset  More than a month ago    Aggravating Factors   sitting and getting, laying    Pain Relieving Factors  rest vs. movement                       [facility] Adult PT Treatment/Exercise - 07/24/19 0001      Self-Care   Self-Care  Posture    Posture  proper posture in sitting to reduce sacral sitting using lumbar roll      Knee/Hip Exercises: Stretches   ITB Stretch  2 reps;Both;30 seconds   with  handout     Knee/Hip Exercises: Aerobic   Nustep  L4 x5 min       Knee/Hip Exercises: Seated   Other Seated Knee/Hip Exercises  clam shell 1 x 20 focus on eccentric control with green theraband    Sit to Sand  1 set;10 reps;2 sets   from elevated surface and lowered between sets     Modalities   Modalities  Iontophoresis      Iontophoresis   Type of Iontophoresis  Dexamethasone    Location  bil greater trochanter    Dose  21m/ml    Time  pain             PT Education - 07/24/19 1659    Education Details  reviewed sleeping position to reduce pressure on the hips. proper sitting position to avoid sacral sitting and promote good posture. updated HEP for glute med stretch. benefits and what ionotphoresis treatment is.    Person(s) Educated  Patient    Methods  Explanation;Verbal cues;Handout    Comprehension  Verbalized understanding;Verbal cues required       PT Short Term Goals - 07/03/19 1714      PT SHORT TERM GOAL #1   Title  pt to be I  with inital HEP    Baseline  met    Time  3    Period  Weeks    Status  Achieved        PT Long Term Goals - 07/03/19 1714      PT LONG TERM GOAL #1   Title  pt to increase bil hip gross strength to >/= 4+/5 in all planes to promote hip knee stability with walking/ standing    Baseline  see objective-progressing    Time  6    Period  Weeks    Status  Partially Met    Target Date  08/14/19      PT LONG TERM GOAL #2   Title  pt to be able to naviage >/= 18 steps reciprocally with no report of pain or limation    Baseline  still with limitation    Time  6    Period  Weeks    Status  On-going    Target Date  08/14/19      PT LONG TERM GOAL #3   Title  pt to increase FOTO score </= 49% limited to demo improvement in function    Baseline  62% limited    Time  6    Period  Weeks    Status  On-going    Target Date  08/14/19      PT LONG TERM GOAL #4   Title  pt to be able to be I with all HEP given as of last visit to maintaining and progress current level of function    Time  6    Period  Weeks    Status  On-going    Target Date  08/14/19            Plan - 07/24/19 1616    Clinical Impression Statement  pt arrived back to PT after her last visit 3 weeks ago reporting 9/10 pain in the hips that is worse with sit to stand transitions and with walking. during sitting position she exhibits increased sacral sitting, worked on good sitting posture using lumbar roll which she noted improvement with sit to stand for a lower surface following use of the roll. educated and consent wsa given to  apply bil hip iontophoresis patches. end of session she noted feeling better.    PT Treatment/Interventions  ADLs/Self Care Home Management;Cryotherapy;Electrical Stimulation;Moist Heat;Ultrasound;Therapeutic activities;Therapeutic exercise;Manual techniques;Passive range of motion;Dry needling;Taping;Iontophoresis 6m/ml Dexamethasone;Functional mobility training;Neuromuscular  re-education;Patient/family education    PT Next Visit Plan  response to ionto, otherwise try [place]chanter[place] region, hip and IT band stretches and strengthening, manual work/STM and joint mobs, further dry needling prn    PT Home Exercise Plan  supine clam, SLR with sustained quad set, ITB stretching (in supine vs. standing alternative), bridge, standing hip abduction SLR, partial squat, re-introduced ITB stretch in standing,    Consulted and Agree with Plan of Care  Patient       Patient will benefit from skilled therapeutic intervention in order to improve the following deficits and impairments:  Pain, Obesity, Decreased activity tolerance, Improper body mechanics, Postural dysfunction, Decreased strength, Decreased endurance  Visit Diagnosis: Pain in right hip  Pain in left hip  Muscle weakness (generalized)  Chronic pain of right knee  Chronic bilateral low back pain with left-sided sciatica     Problem List Patient Active Problem List   Diagnosis Date Noted  . History of total knee replacement, left 04/03/2019  . History of total knee arthroplasty, right 04/03/2019  . DERMATOFIBROMA 10/26/2007  . ANEMIA, CHRONIC 10/26/2007  . SICKLE CELL TRAIT 10/26/2007  . ESSENTIAL HYPERTENSION 10/26/2007  . GASTROESOPHAGEAL REFLUX DISEASE 10/26/2007  . GASTRITIS 10/26/2007  . HIATAL HERNIA 10/26/2007  . CONSTIPATION, CHRONIC 10/26/2007  . GUAIAC POSITIVE STOOL 10/26/2007   KStarr LakePT, DPT, LAT, ATC  07/24/19  5:10 PM      [facility] [place]GAngost[place] [address]Phone: [phone]  Fax:  [phone] Name: Taylor NovosadMRN: [id number] of Birth: [birth year]

## 2019-07-27 ENCOUNTER — Other Ambulatory Visit: Payer: Self-pay

## 2019-07-27 ENCOUNTER — Ambulatory Visit: Payer: BC Managed Care – PPO | Admitting: Physical Therapy

## 2019-07-27 DIAGNOSIS — G8929 Other chronic pain: Secondary | ICD-10-CM

## 2019-07-27 DIAGNOSIS — M25551 Pain in right hip: Secondary | ICD-10-CM

## 2019-07-27 DIAGNOSIS — M25561 Pain in right knee: Secondary | ICD-10-CM

## 2019-07-27 DIAGNOSIS — M25552 Pain in left hip: Secondary | ICD-10-CM

## 2019-07-27 DIAGNOSIS — M6281 Muscle weakness (generalized): Secondary | ICD-10-CM

## 2019-07-27 NOTE — Therapy (Signed)
[place], [place], [address] Phone: [phone]   Fax:  [phone]  Physical Therapy Treatment  Patient Details  Name: Taylor Edwards MRN: [id number] Date of Birth: [birth year] Referring Provider (PT): Jean Rosenthal MD   Encounter Date: 07/27/2019  PT End of Session - 07/27/19 1549    Visit Number  10    Number of Visits  19    Date for PT Re-Evaluation  08/14/19    Authorization Type  BCBS, [place] Medicare, next progress note by visit 17    PT Start Time  1547    PT Stop Time  1625    PT Time Calculation (min)  38 min    Activity Tolerance  Patient tolerated treatment well    Behavior During Therapy  [facility] for tasks assessed/performed       Past Medical History:  Diagnosis Date  . Anemia    no current med.  . Arthritis    knees  . Asthma    no current meds.  . Cataract of both eyes    early  . Diabetes mellitus    only taking natural supplements  . GERD (gastroesophageal reflux disease)    no current med.  . Hypertension    has been on med. x 10 yrs.  . IBS (irritable bowel syndrome)   . Post-nasal drip    current cough  . Sickle cell trait ([place])   . Urinary urgency     Past Surgical History:  Procedure Laterality Date  . ABDOMINAL HYSTERECTOMY    . BUNIONECTOMY  08/25/2011   Procedure: [name];  Surgeon: Hessie Dibble, MD;  Location: [place];  Service: Orthopedics;  Laterality: Right;  . CATARACT EXTRACTION     both eyes  . COLONOSCOPY W/ BIOPSIES AND POLYPECTOMY    . DILATION AND CURETTAGE OF UTERUS    . INJECTION KNEE  08/25/2011   Procedure: KNEE INJECTION;  Surgeon: Hessie Dibble, MD;  Location: [place];  Service: Orthopedics;  Laterality: Bilateral;  . JOINT REPLACEMENT    . KNEE ARTHROSCOPY  12/11/2008   bilat., with chondroplasty  . KNEE JOINT MANIPULATION  10/30/2009   bilat.  . OPEN REDUCTION INTERNAL FIXATION  (ORIF) DISTAL RADIAL FRACTURE Left 11/17/2014   Procedure: OPEN REDUCTION INTERNAL FIXATION (ORIF) LEFT DISTAL RADIUS  FRACTURE;  Surgeon: Charlotte Crumb, MD;  Location: [place];  Service: Orthopedics;  Laterality: Left;  . TOTAL KNEE ARTHROPLASTY  09/26/2009   bilat.    There were no vitals filed for this visit.  Subjective Assessment - 07/27/19 1553    Subjective  " the L hip is better but comes and goes and the pain in the R is more constant. My back is bothering me too as well"    Currently in Pain?  Yes    Pain Score  2    L hip 2/10, R hip 7/10   Pain Orientation  Right;Left    Pain Descriptors / Indicators  Aching    Pain Type  Chronic pain    Pain Onset  More than a month ago    Pain Frequency  Intermittent         [facility] PT Assessment - 07/27/19 0001      Assessment   Medical Diagnosis  Trochanteric bursitis of both hips     Referring Provider (PT)  Jean Rosenthal MD  [facility] Adult PT Treatment/Exercise - 07/27/19 0001      Knee/Hip Exercises: Aerobic   Nustep  L5 x 5 min UE/LE      Knee/Hip Exercises: Standing   Gait Training  heel strike/ toe off with reciprocal arm swing 1 x 50 ft   demonstration for proper form     Knee/Hip Exercises: Seated   Other Seated Knee/Hip Exercises  seated on dynadisc 1 x 10 abdominal set keeping back in good position, seated marching 2 x 10 while seated on dyna disc    Sit to Sand  1 set;10 reps      Iontophoresis   Type of Iontophoresis  Dexamethasone    Location  L greater trochanter, R proximal vastuslateralis    Dose  '4mg'$ /ml    Time  pain      Manual Therapy   Manual therapy comments  MTPR along the glute medius x 2 on the R and x 3 along vastus lateralis    Joint Mobilization  LAD bilat. hips grade III-IV oscillations               PT Short Term Goals - 07/03/19 1714      PT SHORT TERM GOAL #1   Title  pt to be I with inital HEP    Baseline  met    Time  3    Period  Weeks     Status  Achieved        PT Long Term Goals - 07/03/19 1714      PT LONG TERM GOAL #1   Title  pt to increase bil hip gross strength to >/= 4+/5 in all planes to promote hip knee stability with walking/ standing    Baseline  see objective-progressing    Time  6    Period  Weeks    Status  Partially Met    Target Date  08/14/19      PT LONG TERM GOAL #2   Title  pt to be able to naviage >/= 18 steps reciprocally with no report of pain or limation    Baseline  still with limitation    Time  6    Period  Weeks    Status  On-going    Target Date  08/14/19      PT LONG TERM GOAL #3   Title  pt to increase FOTO score </= 49% limited to demo improvement in function    Baseline  62% limited    Time  6    Period  Weeks    Status  On-going    Target Date  08/14/19      PT LONG TERM GOAL #4   Title  pt to be able to be I with all HEP given as of last visit to maintaining and progress current level of function    Time  6    Period  Weeks    Status  On-going    Target Date  08/14/19            Plan - 07/27/19 1628    Clinical Impression Statement  pt noted some relief of pain with the application of iontophoresis previous session. continued utilzation of STW and hip LAD to promote relief. worked on core strengthening in combination with postural control, and gait training with proper form. continued to apply iontophoresis along bil greater trochanter.    PT Next Visit Plan  response to ionto,  hip and IT band stretches and strengthening,  manual work/STM and joint mobs, further dry needling prn, gait training    PT Home Exercise Plan  supine clam, SLR with sustained quad set, ITB stretching (in supine vs. standing alternative), bridge, standing hip abduction SLR, partial squat, re-introduced ITB stretch in standing,    Consulted and Agree with Plan of Care  Patient       Patient will benefit from skilled therapeutic intervention in order to improve the following deficits and  impairments:  Pain, Obesity, Decreased activity tolerance, Improper body mechanics, Postural dysfunction, Decreased strength, Decreased endurance  Visit Diagnosis: Pain in right hip  Pain in left hip  Muscle weakness (generalized)  Chronic pain of right knee     Problem List Patient Active Problem List   Diagnosis Date Noted  . History of total knee replacement, left 04/03/2019  . History of total knee arthroplasty, right 04/03/2019  . DERMATOFIBROMA 10/26/2007  . ANEMIA, CHRONIC 10/26/2007  . SICKLE CELL TRAIT 10/26/2007  . ESSENTIAL HYPERTENSION 10/26/2007  . GASTROESOPHAGEAL REFLUX DISEASE 10/26/2007  . GASTRITIS 10/26/2007  . HIATAL HERNIA 10/26/2007  . CONSTIPATION, CHRONIC 10/26/2007  . GUAIAC POSITIVE STOOL 10/26/2007   Starr Lake PT, DPT, LAT, ATC  07/27/19  4:31 PM      [facility] [address] [place], [place], [address] Phone: [phone]   Fax:  [phone]  Name: Taylor Edwards MRN: [id number] Date of Birth: [birth year]

## 2019-07-31 ENCOUNTER — Other Ambulatory Visit: Payer: Self-pay

## 2019-07-31 ENCOUNTER — Encounter: Payer: Self-pay | Admitting: Physical Therapy

## 2019-07-31 ENCOUNTER — Ambulatory Visit: Payer: BC Managed Care – PPO | Admitting: Physical Therapy

## 2019-07-31 DIAGNOSIS — M25551 Pain in right hip: Secondary | ICD-10-CM

## 2019-07-31 DIAGNOSIS — M6281 Muscle weakness (generalized): Secondary | ICD-10-CM

## 2019-07-31 DIAGNOSIS — M25552 Pain in left hip: Secondary | ICD-10-CM

## 2019-07-31 NOTE — Therapy (Signed)
[place], [place], [address] Phone: [phone]   Fax:  [phone]  Physical Therapy Treatment  Patient Details  Name: Taylor Edwards MRN: [id number] Date of Birth: 03-26-1941 Referring Provider (PT): Jean Rosenthal MD   Encounter Date: 07/31/2019  PT End of Session - 07/31/19 1951    Visit Number  11    Number of Visits  19    Date for PT Re-Evaluation  08/14/19    Authorization Type  BCBS, [facility] Medicare, next progress note by visit 17    PT Start Time  1631    PT Stop Time  1713    PT Time Calculation (min)  42 min    Activity Tolerance  Patient tolerated treatment well    Behavior During Therapy  [facility] for tasks assessed/performed       Past Medical History:  Diagnosis Date  . Anemia    no current med.  . Arthritis    knees  . Asthma    no current meds.  . Cataract of both eyes    early  . Diabetes mellitus    only taking natural supplements  . GERD (gastroesophageal reflux disease)    no current med.  . Hypertension    has been on med. x 10 yrs.  . IBS (irritable bowel syndrome)   . Post-nasal drip    current cough  . Sickle cell trait ([place])   . Urinary urgency     Past Surgical History:  Procedure Laterality Date  . ABDOMINAL HYSTERECTOMY    . BUNIONECTOMY  08/25/2011   Procedure: [name];  Surgeon: Hessie Dibble, MD;  Location: [place];  Service: Orthopedics;  Laterality: Right;  . CATARACT EXTRACTION     both eyes  . COLONOSCOPY W/ BIOPSIES AND POLYPECTOMY    . DILATION AND CURETTAGE OF UTERUS    . INJECTION KNEE  08/25/2011   Procedure: KNEE INJECTION;  Surgeon: Hessie Dibble, MD;  Location: [place];  Service: Orthopedics;  Laterality: Bilateral;  . JOINT REPLACEMENT    . KNEE ARTHROSCOPY  12/11/2008   bilat., with chondroplasty  . KNEE JOINT MANIPULATION  10/30/2009   bilat.  . OPEN REDUCTION INTERNAL FIXATION  (ORIF) DISTAL RADIAL FRACTURE Left 11/17/2014   Procedure: OPEN REDUCTION INTERNAL FIXATION (ORIF) LEFT DISTAL RADIUS  FRACTURE;  Surgeon: Charlotte Crumb, MD;  Location: [place];  Service: Orthopedics;  Laterality: Left;  . TOTAL KNEE ARTHROPLASTY  09/26/2009   bilat.    There were no vitals filed for this visit.  Subjective Assessment - 07/31/19 1734    Subjective  Pt. continues to note some benefit with addition of ionto in plan of care but reports pain is "lower down" in IT band region today bilaterally (previously symptoms more distal only on right).    Currently in Pain?  Yes    Pain Score  6     Pain Location  Hip    Pain Orientation  Right;Left    Pain Descriptors / Indicators  Aching    Pain Type  Chronic pain    Pain Radiating Towards  bilateral lateral thighs/IT band    Pain Onset  More than a month ago    Pain Frequency  Intermittent    Aggravating Factors   sitting, standing, walking, sidelying    Pain Relieving Factors  variable rest vs. movement    Effect of Pain on Daily Activities  limits standing and walking tolerance,  positional tolerance                       [facility] Adult PT Treatment/Exercise - 07/31/19 0001      Knee/Hip Exercises: Aerobic   Nustep  L5 x 5 min UE/LE      Iontophoresis   Type of Iontophoresis  Dexamethasone    Location  Bilateral proximal vastuslateralis/IT band    Dose  62m/ml    Time  pain      Manual Therapy   Joint Mobilization  LAD bilat. hips grade III-IV oscillations    Soft tissue mobilization  STM/IASTM bilateral lateral hips and IT band including distal IT band             PT Education - 07/31/19 1738    Education Details  hip/IT band anatomy, POC    Person(s) Educated  Patient    Methods  Explanation    Comprehension  Verbalized understanding       PT Short Term Goals - 07/03/19 1714      PT SHORT TERM GOAL #1   Title  pt to be I with inital HEP    Baseline  met    Time  3    Period  Weeks     Status  Achieved        PT Long Term Goals - 07/27/19 1731      PT LONG TERM GOAL #1   Title  pt to increase bil hip gross strength to >/= 4+/5 in all planes to promote hip knee stability with walking/ standing    Period  Weeks    Status  Partially Met      PT LONG TERM GOAL #2   Title  pt to be able to naviage >/= 18 steps reciprocally with no report of pain or limation    Time  6    Period  Weeks    Status  On-going      PT LONG TERM GOAL #3   Title  pt to increase FOTO score </= 49% limited to demo improvement in function    Period  Weeks    Status  Unable to assess      PT LONG TERM GOAL #4   Title  pt to be able to be I with all HEP given as of last visit to maintaining and progress current level of function    Period  Weeks    Status  On-going            Plan - 07/31/19 1951    Clinical Impression Statement  Mild relief as previously with addition of ionto patch. Symptom location somewhwat variable between sessions with pain at times more local to greater trochanteric region vs. more distal along vastus lateralis/IT band. Continue to suspect association of symptoms with hip weakness, muscle tightness and contributing factors from associated gait mechanics. Given improvement plan continue PT per POC to furth address functional limitations for positional tolerance and mobility.    Personal Factors and Comorbidities  Age;Comorbidity 3+    Comorbidities  significant Surgical PMHx, hx of arthrits, DM, sickle cell trait    Examination-Activity Limitations  Stairs    Stability/Clinical Decision Making  Evolving/Moderate complexity    Clinical Decision Making  Moderate    Rehab Potential  Good    PT Frequency  2x / week    PT Duration  6 weeks    PT Treatment/Interventions  ADLs/Self Care Home Management;Cryotherapy;Electrical Stimulation;Moist Heat;Ultrasound;Therapeutic activities;Therapeutic  exercise;Manual techniques;Passive range of motion;Dry  needling;Taping;Iontophoresis 52m/ml Dexamethasone;Functional mobility training;Neuromuscular re-education;Patient/family education    PT Next Visit Plan  Continue ionto,  hip and IT band stretches and strengthening, manual work/STM and joint mobs, further dry needling prn, gait training       Patient will benefit from skilled therapeutic intervention in order to improve the following deficits and impairments:  Pain, Obesity, Decreased activity tolerance, Improper body mechanics, Postural dysfunction, Decreased strength, Decreased endurance  Visit Diagnosis: Pain in right hip  Pain in left hip  Muscle weakness (generalized)     Problem List Patient Active Problem List   Diagnosis Date Noted  . History of total knee replacement, left 04/03/2019  . History of total knee arthroplasty, right 04/03/2019  . DERMATOFIBROMA 10/26/2007  . ANEMIA, CHRONIC 10/26/2007  . SICKLE CELL TRAIT 10/26/2007  . ESSENTIAL HYPERTENSION 10/26/2007  . GASTROESOPHAGEAL REFLUX DISEASE 10/26/2007  . GASTRITIS 10/26/2007  . HIATAL HERNIA 10/26/2007  . CONSTIPATION, CHRONIC 10/26/2007  . GUAIAC POSITIVE STOOL 10/26/2007    CBeaulah Dinning PT, DPT 07/31/19 7:56 PM  [facility] [address]Phone: [phone]  Fax:  [phone] Name: LJameka IvieMRN: [id number] of Birth: 829-Sep-1942

## 2019-08-03 ENCOUNTER — Other Ambulatory Visit: Payer: Self-pay

## 2019-08-03 ENCOUNTER — Ambulatory Visit: Payer: BC Managed Care – PPO | Admitting: Physical Therapy

## 2019-08-03 ENCOUNTER — Encounter: Payer: Self-pay | Admitting: Physical Therapy

## 2019-08-03 DIAGNOSIS — M25551 Pain in right hip: Secondary | ICD-10-CM | POA: Diagnosis not present

## 2019-08-03 DIAGNOSIS — M6281 Muscle weakness (generalized): Secondary | ICD-10-CM

## 2019-08-03 DIAGNOSIS — M25552 Pain in left hip: Secondary | ICD-10-CM

## 2019-08-03 NOTE — Therapy (Signed)
[place], [place], [address] Phone: [phone]   Fax:  [phone]  Physical Therapy Treatment  Patient Details  Name: Taylor Edwards MRN: [id number] Date of Birth: 1941-07-27 Referring Provider (PT): Jean Rosenthal MD   Encounter Date: 08/03/2019  PT End of Session - 08/03/19 1753    Visit Number  12    Number of Visits  19    Date for PT Re-Evaluation  08/14/19    Authorization Type  BCBS, [facility] Medicare, next progress note by visit 17    PT Start Time  1707    PT Stop Time  1748    PT Time Calculation (min)  41 min    Activity Tolerance  Patient tolerated treatment well    Behavior During Therapy  [facility] for tasks assessed/performed       Past Medical History:  Diagnosis Date  . Anemia    no current med.  . Arthritis    knees  . Asthma    no current meds.  . Cataract of both eyes    early  . Diabetes mellitus    only taking natural supplements  . GERD (gastroesophageal reflux disease)    no current med.  . Hypertension    has been on med. x 10 yrs.  . IBS (irritable bowel syndrome)   . Post-nasal drip    current cough  . Sickle cell trait ([place])   . Urinary urgency     Past Surgical History:  Procedure Laterality Date  . ABDOMINAL HYSTERECTOMY    . BUNIONECTOMY  08/25/2011   Procedure: [name];  Surgeon: Hessie Dibble, MD;  Location: [place];  Service: Orthopedics;  Laterality: Right;  . CATARACT EXTRACTION     both eyes  . COLONOSCOPY W/ BIOPSIES AND POLYPECTOMY    . DILATION AND CURETTAGE OF UTERUS    . INJECTION KNEE  08/25/2011   Procedure: KNEE INJECTION;  Surgeon: Hessie Dibble, MD;  Location: [place];  Service: Orthopedics;  Laterality: Bilateral;  . JOINT REPLACEMENT    . KNEE ARTHROSCOPY  12/11/2008   bilat., with chondroplasty  . KNEE JOINT MANIPULATION  10/30/2009   bilat.  . OPEN REDUCTION INTERNAL FIXATION  (ORIF) DISTAL RADIAL FRACTURE Left 11/17/2014   Procedure: OPEN REDUCTION INTERNAL FIXATION (ORIF) LEFT DISTAL RADIUS  FRACTURE;  Surgeon: Charlotte Crumb, MD;  Location: [place];  Service: Orthopedics;  Laterality: Left;  . TOTAL KNEE ARTHROPLASTY  09/26/2009   bilat.    There were no vitals filed for this visit.  Subjective Assessment - 08/03/19 1709    Subjective  Pt. reports hip symptoms "come and go" and are not as bad today. She has been doing exercises at home.                       [facility] Adult PT Treatment/Exercise - 08/03/19 0001      Knee/Hip Exercises: Stretches   Passive Hamstring Stretch  Right;Left;2 reps;30 seconds    ITB Stretch  Right;Left;3 reps;30 seconds    Piriformis Stretch  Right;Left;3 reps;30 seconds    Other Knee/Hip Stretches  HEP review standing TFL/IT band stretch, reviewed strengthening HEP with standing hip 3-way SLR, squats and step ups      Knee/Hip Exercises: Aerobic   Nustep  L5 x 5 min UE/LE      Iontophoresis   Type of Iontophoresis  Dexamethasone    Location  Bilateral greater  trochanteric region    Dose  75m/ml    Time  pain      Manual Therapy   Soft tissue mobilization  STM/IASTM bilateral lateral hips and IT band including distal IT band             PT Education - 08/03/19 1752    Education Details  HEP    Person(s) Educated  Patient    Methods  Explanation;Demonstration;Verbal cues    Comprehension  Verbalized understanding       PT Short Term Goals - 07/03/19 1714      PT SHORT TERM GOAL #1   Title  pt to be I with inital HEP    Baseline  met    Time  3    Period  Weeks    Status  Achieved        PT Long Term Goals - 07/27/19 1731      PT LONG TERM GOAL #1   Title  pt to increase bil hip gross strength to >/= 4+/5 in all planes to promote hip knee stability with walking/ standing    Period  Weeks    Status  Partially Met      PT LONG TERM GOAL #2   Title  pt to be able to naviage >/= 18 steps  reciprocally with no report of pain or limation    Time  6    Period  Weeks    Status  On-going      PT LONG TERM GOAL #3   Title  pt to increase FOTO score </= 49% limited to demo improvement in function    Period  Weeks    Status  Unable to assess      PT LONG TERM GOAL #4   Title  pt to be able to be I with all HEP given as of last visit to maintaining and progress current level of function    Period  Weeks    Status  On-going            Plan - 08/03/19 1753    Clinical Impression Statement  Continued previous tx. focus given benefit noted. Given more passive focus in recent therapy also reviewed HEP for hip strengthening and stretches with verbal cues for form in particular for standing TFL/IT band stretch. Progress ongoing re: therapy goals with status impacted by variable symptoms from session to session. Improved ability to descend stairs but still noting difficulty ascending stairs.    Personal Factors and Comorbidities  Age;Comorbidity 3+    Comorbidities  significant Surgical PMHx, hx of arthrits, DM, sickle cell trait    Examination-Activity Limitations  Stairs    Stability/Clinical Decision Making  Evolving/Moderate complexity    Clinical Decision Making  Moderate    Rehab Potential  Good    PT Frequency  2x / week    PT Duration  6 weeks    PT Treatment/Interventions  ADLs/Self Care Home Management;Cryotherapy;Electrical Stimulation;Moist Heat;Ultrasound;Therapeutic activities;Therapeutic exercise;Manual techniques;Passive range of motion;Dry needling;Taping;Iontophoresis 452mml Dexamethasone;Functional mobility training;Neuromuscular re-education;Patient/family education    PT Next Visit Plan  Continue ionto,  hip and IT band stretches and strengthening, manual work/STM and joint mobs, further dry needling prn, gait training    PT Home Exercise Plan  supine clam, SLR with sustained quad set, ITB stretching (in supine vs. standing alternative), bridge, standing hip  abduction SLR, partial squat, re-introduced ITB stretch in standing,    Consulted and Agree with Plan of Care  Patient  Patient will benefit from skilled therapeutic intervention in order to improve the following deficits and impairments:  Pain, Obesity, Decreased activity tolerance, Improper body mechanics, Postural dysfunction, Decreased strength, Decreased endurance  Visit Diagnosis: Pain in right hip  Pain in left hip  Muscle weakness (generalized)     Problem List Patient Active Problem List   Diagnosis Date Noted  . History of total knee replacement, left 04/03/2019  . History of total knee arthroplasty, right 04/03/2019  . DERMATOFIBROMA 10/26/2007  . ANEMIA, CHRONIC 10/26/2007  . SICKLE CELL TRAIT 10/26/2007  . ESSENTIAL HYPERTENSION 10/26/2007  . GASTROESOPHAGEAL REFLUX DISEASE 10/26/2007  . GASTRITIS 10/26/2007  . HIATAL HERNIA 10/26/2007  . CONSTIPATION, CHRONIC 10/26/2007  . GUAIAC POSITIVE STOOL 10/26/2007    Beaulah Dinning, PT, DPT 08/03/19 5:57 PM  [facility] [address] [place], [place], [address] Phone: [phone]   Fax:  [phone]  Name: Amily Depp MRN: [id number] Date of Birth: [birth year]

## 2019-08-07 ENCOUNTER — Ambulatory Visit: Payer: BC Managed Care – PPO | Admitting: Physical Therapy

## 2019-08-07 ENCOUNTER — Other Ambulatory Visit: Payer: Self-pay

## 2019-08-07 ENCOUNTER — Encounter: Payer: Self-pay | Admitting: Physical Therapy

## 2019-08-07 DIAGNOSIS — M25552 Pain in left hip: Secondary | ICD-10-CM

## 2019-08-07 DIAGNOSIS — M25551 Pain in right hip: Secondary | ICD-10-CM | POA: Diagnosis not present

## 2019-08-07 DIAGNOSIS — M6281 Muscle weakness (generalized): Secondary | ICD-10-CM

## 2019-08-07 NOTE — Therapy (Signed)
[place], [place], [address] Phone: [phone]   Fax:  [phone]  Physical Therapy Treatment  Patient Details  Name: Taylor Edwards MRN: [id number] Date of Birth: 21-Feb-1941 Referring Provider (PT): Jean Rosenthal MD   Encounter Date: 08/07/2019  PT End of Session - 08/07/19 2135    Visit Number  13    Number of Visits  19    Date for PT Re-Evaluation  08/14/19    Authorization Type  BCBS, [facility] Medicare, next progress note by visit 17    PT Start Time  1715    PT Stop Time  1757    PT Time Calculation (min)  42 min    Activity Tolerance  Patient tolerated treatment well    Behavior During Therapy  [facility] for tasks assessed/performed       Past Medical History:  Diagnosis Date  . Anemia    no current med.  . Arthritis    knees  . Asthma    no current meds.  . Cataract of both eyes    early  . Diabetes mellitus    only taking natural supplements  . GERD (gastroesophageal reflux disease)    no current med.  . Hypertension    has been on med. x 10 yrs.  . IBS (irritable bowel syndrome)   . Post-nasal drip    current cough  . Sickle cell trait ([place])   . Urinary urgency     Past Surgical History:  Procedure Laterality Date  . ABDOMINAL HYSTERECTOMY    . BUNIONECTOMY  08/25/2011   Procedure: [name];  Surgeon: Hessie Dibble, MD;  Location: [place];  Service: Orthopedics;  Laterality: Right;  . CATARACT EXTRACTION     both eyes  . COLONOSCOPY W/ BIOPSIES AND POLYPECTOMY    . DILATION AND CURETTAGE OF UTERUS    . INJECTION KNEE  08/25/2011   Procedure: KNEE INJECTION;  Surgeon: Hessie Dibble, MD;  Location: [place];  Service: Orthopedics;  Laterality: Bilateral;  . JOINT REPLACEMENT    . KNEE ARTHROSCOPY  12/11/2008   bilat., with chondroplasty  . KNEE JOINT MANIPULATION  10/30/2009   bilat.  . OPEN REDUCTION INTERNAL FIXATION  (ORIF) DISTAL RADIAL FRACTURE Left 11/17/2014   Procedure: OPEN REDUCTION INTERNAL FIXATION (ORIF) LEFT DISTAL RADIUS  FRACTURE;  Surgeon: Charlotte Crumb, MD;  Location: [place];  Service: Orthopedics;  Laterality: Left;  . TOTAL KNEE ARTHROPLASTY  09/26/2009   bilat.    There were no vitals filed for this visit.  Subjective Assessment - 08/07/19 1716    Subjective  Pt. reports still has pain in the same places as previously (lateral hips, IT band) but not as severe.    Currently in Pain?  Yes    Pain Score  4     Pain Location  Hip    Pain Orientation  Right;Left;Lateral    Pain Descriptors / Indicators  Aching    Pain Type  Chronic pain    Pain Radiating Towards  bilateral lateral thighs and IT band    Pain Onset  More than a month ago    Pain Frequency  Intermittent    Aggravating Factors   sitting, standing, walking, sidelying    Pain Relieving Factors  variable rest vs. movement    Effect of Pain on Daily Activities  limits standing and walking tolerance, positional tolerance  [place] Adult PT Treatment/Exercise - 08/07/19 0001      Ambulation/Gait   Gait Comments  pt. demos gait with lack of heel strike/tendency for foot contact with foot flat, instructed/reviewed gait mechanics for heel strike with gait      Knee/Hip Exercises: Stretches   ITB Stretch  Right;Left;3 reps;30 seconds    Piriformis Stretch  Right;Left;3 reps;30 seconds      Knee/Hip Exercises: Aerobic   Nustep  L5 x 5 min UE/LE      Knee/Hip Exercises: Standing   Hip Abduction  Stengthening;Both;15 reps    Abduction Limitations  Green Theraband    Forward Step Up  Both;10 reps;Hand Hold: 1;Step Height: 6"    Forward Step Up Limitations  with opp. hip hike, cues for avoidance contraleteral hip drop    Functional Squat Limitations  partial squat at counter x 15 reps with green band proximal to knees      Iontophoresis   Type of Iontophoresis  Dexamethasone    Location   Bilateral greater trochanteric region    Dose  '4mg'$ /ml    Time  pain      Manual Therapy   Joint Mobilization  LAD bilat. hips grade III-IV oscillations    Soft tissue mobilization  STM/IASTM bilateral lateral hips and IT band including distal IT band             PT Education - 08/07/19 2131    Education Details  hip abductor role in gait, exercises, gait    Person(s) Educated  Patient    Methods  Explanation;Demonstration;Verbal cues    Comprehension  Verbalized understanding;Returned demonstration;Verbal cues required       PT Short Term Goals - 07/03/19 1714      PT SHORT TERM GOAL #1   Title  pt to be I with inital HEP    Baseline  met    Time  3    Period  Weeks    Status  Achieved        PT Long Term Goals - 07/27/19 1731      PT LONG TERM GOAL #1   Title  pt to increase bil hip gross strength to >/= 4+/5 in all planes to promote hip knee stability with walking/ standing    Period  Weeks    Status  Partially Met      PT LONG TERM GOAL #2   Title  pt to be able to naviage >/= 18 steps reciprocally with no report of pain or limation    Time  6    Period  Weeks    Status  On-going      PT LONG TERM GOAL #3   Title  pt to increase FOTO score </= 49% limited to demo improvement in function    Period  Weeks    Status  Unable to assess      PT LONG TERM GOAL #4   Title  pt to be able to be I with all HEP given as of last visit to maintaining and progress current level of function    Period  Weeks    Status  On-going            Plan - 08/07/19 2135    Clinical Impression Statement  Pt. demos lack of hel strike with gait which suspect could be contributing to gait mechanics associated with hip pain so included brief gait training and mechanics review to address. Also included work on standing strengthening with cues  and emphasis avoiding contraletaral hip drop. Mild improvement from status last week with decreased pain and improved walking and positional  tolerance.    Personal Factors and Comorbidities  Age;Comorbidity 3+    Comorbidities  significant Surgical PMHx, hx of arthrits, DM, sickle cell trait    Examination-Activity Limitations  Stairs    Stability/Clinical Decision Making  Evolving/Moderate complexity    Clinical Decision Making  Moderate    Rehab Potential  Good    PT Frequency  2x / week    PT Duration  6 weeks    PT Treatment/Interventions  ADLs/Self Care Home Management;Cryotherapy;Electrical Stimulation;Moist Heat;Ultrasound;Therapeutic activities;Therapeutic exercise;Manual techniques;Passive range of motion;Dry needling;Taping;Iontophoresis '4mg'$ /ml Dexamethasone;Functional mobility training;Neuromuscular re-education;Patient/family education    PT Next Visit Plan  Continue ionto,  hip and IT band stretches and strengthening, manual work/STM and joint mobs, further dry needling prn, gait training    PT Home Exercise Plan  supine clam, SLR with sustained quad set, ITB stretching (in supine vs. standing alternative), bridge, standing hip abduction SLR, partial squat, re-introduced ITB stretch in standing,    Consulted and Agree with Plan of Care  Patient       Patient will benefit from skilled therapeutic intervention in order to improve the following deficits and impairments:  Pain, Obesity, Decreased activity tolerance, Improper body mechanics, Postural dysfunction, Decreased strength, Decreased endurance  Visit Diagnosis: Pain in right hip  Pain in left hip  Muscle weakness (generalized)     Problem List Patient Active Problem List   Diagnosis Date Noted  . History of total knee replacement, left 04/03/2019  . History of total knee arthroplasty, right 04/03/2019  . DERMATOFIBROMA 10/26/2007  . ANEMIA, CHRONIC 10/26/2007  . SICKLE CELL TRAIT 10/26/2007  . ESSENTIAL HYPERTENSION 10/26/2007  . GASTROESOPHAGEAL REFLUX DISEASE 10/26/2007  . GASTRITIS 10/26/2007  . HIATAL HERNIA 10/26/2007  . CONSTIPATION, CHRONIC  10/26/2007  . GUAIAC POSITIVE STOOL 10/26/2007    Beaulah Dinning, PT, DPT 08/07/19 9:39 PM  [facility] [address] [place], [place], [address] Phone: [phone]   Fax:  [phone]  Name: Taylor Edwards MRN: [id number] Date of Birth: 03/09/1941

## 2019-08-10 ENCOUNTER — Other Ambulatory Visit: Payer: Self-pay

## 2019-08-10 ENCOUNTER — Ambulatory Visit: Payer: BC Managed Care – PPO | Admitting: Physical Therapy

## 2019-08-10 DIAGNOSIS — M25552 Pain in left hip: Secondary | ICD-10-CM

## 2019-08-10 DIAGNOSIS — M25551 Pain in right hip: Secondary | ICD-10-CM

## 2019-08-10 DIAGNOSIS — M6281 Muscle weakness (generalized): Secondary | ICD-10-CM

## 2019-08-10 NOTE — Therapy (Signed)
[place], [place], [address] Phone: [phone]   Fax:  [phone]  Physical Therapy Treatment  Patient Details  Name: Taylor Edwards MRN: [id number] Date of Birth: 04/26/1941 Referring Provider (PT): Jean Rosenthal MD   Encounter Date: 08/10/2019  PT End of Session - 08/10/19 1920    Visit Number  14    Number of Visits  19    Date for PT Re-Evaluation  08/14/19    Authorization Type  BCBS, [facility] Medicare, next progress note by visit 17    PT Start Time  1712    PT Stop Time  1757    PT Time Calculation (min)  45 min    Activity Tolerance  Patient tolerated treatment well    Behavior During Therapy  [facility] for tasks assessed/performed       Past Medical History:  Diagnosis Date  . Anemia    no current med.  . Arthritis    knees  . Asthma    no current meds.  . Cataract of both eyes    early  . Diabetes mellitus    only taking natural supplements  . GERD (gastroesophageal reflux disease)    no current med.  . Hypertension    has been on med. x 10 yrs.  . IBS (irritable bowel syndrome)   . Post-nasal drip    current cough  . Sickle cell trait ([place])   . Urinary urgency     Past Surgical History:  Procedure Laterality Date  . ABDOMINAL HYSTERECTOMY    . BUNIONECTOMY  08/25/2011   Procedure: [name];  Surgeon: Hessie Dibble, MD;  Location: [place];  Service: Orthopedics;  Laterality: Right;  . CATARACT EXTRACTION     both eyes  . COLONOSCOPY W/ BIOPSIES AND POLYPECTOMY    . DILATION AND CURETTAGE OF UTERUS    . INJECTION KNEE  08/25/2011   Procedure: KNEE INJECTION;  Surgeon: Hessie Dibble, MD;  Location: [place];  Service: Orthopedics;  Laterality: Bilateral;  . JOINT REPLACEMENT    . KNEE ARTHROSCOPY  12/11/2008   bilat., with chondroplasty  . KNEE JOINT MANIPULATION  10/30/2009   bilat.  . OPEN REDUCTION INTERNAL FIXATION  (ORIF) DISTAL RADIAL FRACTURE Left 11/17/2014   Procedure: OPEN REDUCTION INTERNAL FIXATION (ORIF) LEFT DISTAL RADIUS  FRACTURE;  Surgeon: Charlotte Crumb, MD;  Location: [place];  Service: Orthopedics;  Laterality: Left;  . TOTAL KNEE ARTHROPLASTY  09/26/2009   bilat.    There were no vitals filed for this visit.  Subjective Assessment - 08/10/19 1918    Subjective  Pt. reports she has had some discomfort in right anterior hip and groin region. Still with some soreness in lateral hip and thigh region as well.                       [place] Adult PT Treatment/Exercise - 08/10/19 0001      Knee/Hip Exercises: Stretches   ITB Stretch  Right;Left;3 reps;30 seconds    ITB Stretch Limitations  supine manual stretch    Piriformis Stretch  Right;Left;3 reps;30 seconds    Piriformis Stretch Limitations  supine manual stretch      Iontophoresis   Type of Iontophoresis  Dexamethasone    Location  Bilateral greater trochanteric region    Dose  '4mg'$ /ml    Time  pain      Manual Therapy   Joint Mobilization  LAD bilat. hips grade III-IV oscillations    Soft tissue mobilization  STM/IASTM bilateral lateral hips and IT band including distal IT band               PT Short Term Goals - 07/03/19 1714      PT SHORT TERM GOAL #1   Title  pt to be I with inital HEP    Baseline  met    Time  3    Period  Weeks    Status  Achieved        PT Long Term Goals - 07/27/19 1731      PT LONG TERM GOAL #1   Title  pt to increase bil hip gross strength to >/= 4+/5 in all planes to promote hip knee stability with walking/ standing    Period  Weeks    Status  Partially Met      PT LONG TERM GOAL #2   Title  pt to be able to naviage >/= 18 steps reciprocally with no report of pain or limation    Time  6    Period  Weeks    Status  On-going      PT LONG TERM GOAL #3   Title  pt to increase FOTO score </= 49% limited to demo improvement in function    Period  Weeks    Status   Unable to assess      PT LONG TERM GOAL #4   Title  pt to be able to be I with all HEP given as of last visit to maintaining and progress current level of function    Period  Weeks    Status  On-going            Plan - 08/10/19 1921    Clinical Impression Statement  Pt. report of discomfort in anterior hip and groing region more consistent with OA-associated pain than trochanteric bursitis and IT band syndrome. Tentatively plan d/c to HEP next visit for current plan of care with patient to continue hip strengthening and stretches via HEP. Recommend pt. see MD for anterior hip/groin symptoms if persisting for further assessment and tx. options.    Personal Factors and Comorbidities  Age;Comorbidity 3+    Comorbidities  significant Surgical PMHx, hx of arthrits, DM, sickle cell trait    Examination-Activity Limitations  Stairs    Stability/Clinical Decision Making  Evolving/Moderate complexity    Clinical Decision Making  Moderate    Rehab Potential  Good    PT Frequency  2x / week    PT Duration  6 weeks    PT Treatment/Interventions  ADLs/Self Care Home Management;Cryotherapy;Electrical Stimulation;Moist Heat;Ultrasound;Therapeutic activities;Therapeutic exercise;Manual techniques;Passive range of motion;Dry needling;Taping;Iontophoresis '4mg'$ /ml Dexamethasone;Functional mobility training;Neuromuscular re-education;Patient/family education    PT Next Visit Plan  d/c to HEP next visit    PT Home Exercise Plan  supine clam, SLR with sustained quad set, ITB stretching (in supine vs. standing alternative), bridge, standing hip abduction SLR, partial squat, re-introduced ITB stretch in standing,    Consulted and Agree with Plan of Care  Patient       Patient will benefit from skilled therapeutic intervention in order to improve the following deficits and impairments:  Pain, Obesity, Decreased activity tolerance, Improper body mechanics, Postural dysfunction, Decreased strength, Decreased  endurance  Visit Diagnosis: Pain in right hip  Pain in left hip  Muscle weakness (generalized)     Problem List Patient Active Problem List   Diagnosis Date Noted  .  History of total knee replacement, left 04/03/2019  . History of total knee arthroplasty, right 04/03/2019  . DERMATOFIBROMA 10/26/2007  . ANEMIA, CHRONIC 10/26/2007  . SICKLE CELL TRAIT 10/26/2007  . ESSENTIAL HYPERTENSION 10/26/2007  . GASTROESOPHAGEAL REFLUX DISEASE 10/26/2007  . GASTRITIS 10/26/2007  . HIATAL HERNIA 10/26/2007  . CONSTIPATION, CHRONIC 10/26/2007  . GUAIAC POSITIVE STOOL 10/26/2007    Beaulah Dinning, PT, DPT 08/10/19 7:25 PM  [facility] [address] [place], [place], [address] Phone: [phone]   Fax:  [phone]  Name: Taylor Edwards MRN: [id number] Date of Birth: [birth year]

## 2019-08-14 ENCOUNTER — Encounter: Payer: Self-pay | Admitting: Physical Therapy

## 2019-08-14 ENCOUNTER — Other Ambulatory Visit: Payer: Self-pay

## 2019-08-14 ENCOUNTER — Ambulatory Visit: Payer: BC Managed Care – PPO | Admitting: Physical Therapy

## 2019-08-14 DIAGNOSIS — M6281 Muscle weakness (generalized): Secondary | ICD-10-CM

## 2019-08-14 DIAGNOSIS — M25551 Pain in right hip: Secondary | ICD-10-CM

## 2019-08-14 DIAGNOSIS — M25552 Pain in left hip: Secondary | ICD-10-CM

## 2019-08-14 NOTE — Therapy (Signed)
[place], [place], [address] Phone: [phone]   Fax:  [phone]  Physical Therapy Treatment/Discharge  Patient Details  Name: Taylor Edwards MRN: [id number] Date of Birth: January 14, 1941 Referring Provider (PT): Jean Rosenthal MD   Encounter Date: 08/14/2019  PT End of Session - 08/14/19 1643    Visit Number  15    Number of Visits  19    Date for PT Re-Evaluation  08/14/19    Authorization Type  BCBS, [facility] Medicare, next progress note by visit 17    PT Start Time  1600   late arrival   PT Stop Time  1639    PT Time Calculation (min)  39 min    Activity Tolerance  Patient tolerated treatment well    Behavior During Therapy  [facility] for tasks assessed/performed       Past Medical History:  Diagnosis Date  . Anemia    no current med.  . Arthritis    knees  . Asthma    no current meds.  . Cataract of both eyes    early  . Diabetes mellitus    only taking natural supplements  . GERD (gastroesophageal reflux disease)    no current med.  . Hypertension    has been on med. x 10 yrs.  . IBS (irritable bowel syndrome)   . Post-nasal drip    current cough  . Sickle cell trait ([place])   . Urinary urgency     Past Surgical History:  Procedure Laterality Date  . ABDOMINAL HYSTERECTOMY    . BUNIONECTOMY  08/25/2011   Procedure: [name];  Surgeon: Hessie Dibble, MD;  Location: [place];  Service: Orthopedics;  Laterality: Right;  . CATARACT EXTRACTION     both eyes  . COLONOSCOPY W/ BIOPSIES AND POLYPECTOMY    . DILATION AND CURETTAGE OF UTERUS    . INJECTION KNEE  08/25/2011   Procedure: KNEE INJECTION;  Surgeon: Hessie Dibble, MD;  Location: [place];  Service: Orthopedics;  Laterality: Bilateral;  . JOINT REPLACEMENT    . KNEE ARTHROSCOPY  12/11/2008   bilat., with chondroplasty  . KNEE JOINT MANIPULATION  10/30/2009   bilat.  . OPEN  REDUCTION INTERNAL FIXATION (ORIF) DISTAL RADIAL FRACTURE Left 11/17/2014   Procedure: OPEN REDUCTION INTERNAL FIXATION (ORIF) LEFT DISTAL RADIUS  FRACTURE;  Surgeon: Charlotte Crumb, MD;  Location: [place];  Service: Orthopedics;  Laterality: Left;  . TOTAL KNEE ARTHROPLASTY  09/26/2009   bilat.    There were no vitals filed for this visit.  Subjective Assessment - 08/14/19 1643    Subjective  No pain pre-tx. but pt. reports had right lateral hip and thigh pain earlier today. She has been using a roller for her hip and thigh muscles which has helped with pain.    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  No/denies         [facility] PT Assessment - 08/14/19 0001      Observation/Other Assessments   Focus on Therapeutic Outcomes (FOTO)   53% limited      Strength   Overall Strength Comments  bilat. hip abd and ext 4/5  [place] Adult PT Treatment/Exercise - 08/14/19 0001      Knee/Hip Exercises: Stretches   ITB Stretch Limitations  standing IT band stretch at counter 2x 30 sec ea. bilat.    Piriformis Stretch Limitations  instruction and brief practice HEP variations      Knee/Hip Exercises: Aerobic   Nustep  L5 x 5 min UE/LE      Knee/Hip Exercises: Standing   Hip Abduction  Stengthening;Both;2 sets;10 reps    Abduction Limitations  Green Theraband    Forward Step Up  Both;10 reps;Hand Hold: 1;Step Height: 6"    Functional Squat Limitations  partial squatat counter 2x10      Knee/Hip Exercises: Supine   Bridges  Strengthening;Both;15 reps    Other Supine Knee/Hip Exercises  clamshell green band x 15 reps      Iontophoresis   Type of Iontophoresis  Dexamethasone    Location  Bilateral greater trochanteric region    Dose  '4mg'$ /ml    Time  pain      Manual Therapy   Joint Mobilization  LAD bilat. hips grade  III-IV oscillations    Soft tissue mobilization  STM/IASTM bilateral lateral hips and IT band including distal IT band             PT Education - 08/14/19 1643    Education Details  HEP updates, POC    Person(s) Educated  Patient    Methods  Explanation;Demonstration    Comprehension  Returned demonstration;Verbalized understanding       PT Short Term Goals - 07/03/19 1714      PT SHORT TERM GOAL #1   Title  pt to be I with inital HEP    Baseline  met    Time  3    Period  Weeks    Status  Achieved        PT Long Term Goals - 08/14/19 1651      PT LONG TERM GOAL #1   Title  pt to increase bil hip gross strength to >/= 4+/5 in all planes to promote hip knee stability with walking/ standing    Baseline  4/5 abd/extension    Time  6    Period  Weeks    Status  Partially Met      PT LONG TERM GOAL #2   Title  pt to be able to naviage >/= 18 steps reciprocally with no report of pain or limation    Baseline  still with intermittent limitations    Time  6    Period  Weeks    Status  Not Met      PT LONG TERM GOAL #3   Title  pt to increase FOTO score </= 49% limited to demo improvement in function    Baseline  53% limited    Time  6    Period  Weeks    Status  Not Met      PT LONG TERM GOAL #4   Title  pt to be able to be I with all HEP given as of last visit to maintaining and progress current level of function    Baseline  met    Time  6    Period  Weeks    Status  Achieved            Plan - 08/14/19 1644    Clinical Impression Statement  Pt. has attended 15 therapy visits for current case with some breaks between visits earlier in  plan of care in July in August resulting in some delay and extension of plan of care. Fair progress with therapy with decreased pain in hips from previous status but continued intermittent symptoms with variable location between trochanteric region and lateral hip and thigh as well as some recent right anterior hip and groin  pain concerning for OA. We have run a course of treatment including modalities with iontophoresis to lateral hip as well as stretching, strengthening and manual tx. At this point have updated HEP and expect pt. can continue her progress independently and recommend MD follow up for any changes in status or unresolved/ongoing symptoms.    Personal Factors and Comorbidities  Age;Comorbidity 3+    Comorbidities  significant Surgical PMHx, hx of arthrits, DM, sickle cell trait    Examination-Activity Limitations  Stairs    Stability/Clinical Decision Making  Evolving/Moderate complexity    Clinical Decision Making  Moderate    Rehab Potential  Good    PT Frequency  2x / week    PT Duration  6 weeks    PT Treatment/Interventions  ADLs/Self Care Home Management;Cryotherapy;Electrical Stimulation;Moist Heat;Ultrasound;Therapeutic activities;Therapeutic exercise;Manual techniques;Passive range of motion;Dry needling;Taping;Iontophoresis '4mg'$ /ml Dexamethasone;Functional mobility training;Neuromuscular re-education;Patient/family education    PT Next Visit Plan  NA    PT Home Exercise Plan  standing vs. supine IT band stretch, piriformis stretch, hip abd with Theraband, squats, step ups, clamshell, hip bridge, roller use for IT band and hip/thigh    Consulted and Agree with Plan of Care  Patient       Patient will benefit from skilled therapeutic intervention in order to improve the following deficits and impairments:  Pain, Obesity, Decreased activity tolerance, Improper body mechanics, Postural dysfunction, Decreased strength, Decreased endurance  Visit Diagnosis: Pain in right hip  Pain in left hip  Muscle weakness (generalized)     Problem List Patient Active Problem List   Diagnosis Date Noted  . History of total knee replacement, left 04/03/2019  . History of total knee arthroplasty, right 04/03/2019  . DERMATOFIBROMA 10/26/2007  . ANEMIA, CHRONIC 10/26/2007  . SICKLE CELL TRAIT  10/26/2007  . ESSENTIAL HYPERTENSION 10/26/2007  . GASTROESOPHAGEAL REFLUX DISEASE 10/26/2007  . GASTRITIS 10/26/2007  . HIATAL HERNIA 10/26/2007  . CONSTIPATION, CHRONIC 10/26/2007  . GUAIAC POSITIVE STOOL 10/26/2007        PHYSICAL THERAPY DISCHARGE SUMMARY  Visits from Start of Care: 15  Current functional level related to goals / functional outcomes: D/c to HEP-see assessment   Remaining deficits: Hip weakness and muscle tightness, limited tolerance prolonged standing and ambulation   Education / Equipment: HEP Plan: Patient agrees to discharge.  Patient goals were partially met. Patient is being discharged due to meeting the stated rehab goals.  ?????           Beaulah Dinning, PT, DPT 08/14/19 4:55 PM     [facility] [address] [place], [place], [address] Phone: [phone]   Fax:  [phone]  Name: Taylor Edwards MRN: [id number] Date of Birth: 09/23/1940

## 2019-08-14 NOTE — Therapy (Signed)
[place], [place], [address] Phone: [phone]   Fax:  [phone]  Physical Therapy Treatment/Discharge  Patient Details  Name: Taylor Edwards MRN: [id number] Date of Birth: 07/25/1941 Referring Provider (PT): Jean Rosenthal MD   Encounter Date: 08/14/2019  PT End of Session - 08/14/19 1643    Visit Number  15    Number of Visits  19    Date for PT Re-Evaluation  08/14/19    Authorization Type  BCBS, [facility] Medicare, next progress note by visit 17    PT Start Time  1600   late arrival   PT Stop Time  1639    PT Time Calculation (min)  39 min    Activity Tolerance  Patient tolerated treatment well    Behavior During Therapy  [facility] for tasks assessed/performed       Past Medical History:  Diagnosis Date  . Anemia    no current med.  . Arthritis    knees  . Asthma    no current meds.  . Cataract of both eyes    early  . Diabetes mellitus    only taking natural supplements  . GERD (gastroesophageal reflux disease)    no current med.  . Hypertension    has been on med. x 10 yrs.  . IBS (irritable bowel syndrome)   . Post-nasal drip    current cough  . Sickle cell trait ([place])   . Urinary urgency     Past Surgical History:  Procedure Laterality Date  . ABDOMINAL HYSTERECTOMY    . BUNIONECTOMY  08/25/2011   Procedure: [name];  Surgeon: Hessie Dibble, MD;  Location: [place];  Service: Orthopedics;  Laterality: Right;  . CATARACT EXTRACTION     both eyes  . COLONOSCOPY W/ BIOPSIES AND POLYPECTOMY    . DILATION AND CURETTAGE OF UTERUS    . INJECTION KNEE  08/25/2011   Procedure: KNEE INJECTION;  Surgeon: Hessie Dibble, MD;  Location: [place];  Service: Orthopedics;  Laterality: Bilateral;  . JOINT REPLACEMENT    . KNEE ARTHROSCOPY  12/11/2008   bilat., with chondroplasty  . KNEE JOINT MANIPULATION  10/30/2009   bilat.  . OPEN  REDUCTION INTERNAL FIXATION (ORIF) DISTAL RADIAL FRACTURE Left 11/17/2014   Procedure: OPEN REDUCTION INTERNAL FIXATION (ORIF) LEFT DISTAL RADIUS  FRACTURE;  Surgeon: Charlotte Crumb, MD;  Location: [place];  Service: Orthopedics;  Laterality: Left;  . TOTAL KNEE ARTHROPLASTY  09/26/2009   bilat.    There were no vitals filed for this visit.  Subjective Assessment - 08/14/19 1643    Subjective  No pain pre-tx. but pt. reports had right lateral hip and thigh pain earlier today. She has been using a roller for her hip and thigh muscles which has helped with pain.    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  No/denies         [facility] PT Assessment - 08/14/19 0001      Observation/Other Assessments   Focus on Therapeutic Outcomes (FOTO)   53% limited      Strength   Overall Strength Comments  bilat. hip abd and ext 4/5  [place] Adult PT Treatment/Exercise - 08/14/19 0001      Knee/Hip Exercises: Stretches   ITB Stretch Limitations  standing IT band stretch at counter 2x 30 sec ea. bilat.    Piriformis Stretch Limitations  instruction and brief practice HEP variations      Knee/Hip Exercises: Aerobic   Nustep  L5 x 5 min UE/LE      Knee/Hip Exercises: Standing   Hip Abduction  Stengthening;Both;2 sets;10 reps    Abduction Limitations  Green Theraband    Forward Step Up  Both;10 reps;Hand Hold: 1;Step Height: 6"    Functional Squat Limitations  partial squatat counter 2x10      Knee/Hip Exercises: Supine   Bridges  Strengthening;Both;15 reps    Other Supine Knee/Hip Exercises  clamshell green band x 15 reps      Iontophoresis   Type of Iontophoresis  Dexamethasone    Location  Bilateral greater trochanteric region    Dose  '4mg'$ /ml    Time  pain      Manual Therapy   Joint Mobilization  LAD bilat. hips grade  III-IV oscillations    Soft tissue mobilization  STM/IASTM bilateral lateral hips and IT band including distal IT band             PT Education - 08/14/19 1643    Education Details  HEP updates, POC    Person(s) Educated  Patient    Methods  Explanation;Demonstration    Comprehension  Returned demonstration;Verbalized understanding       PT Short Term Goals - 07/03/19 1714      PT SHORT TERM GOAL #1   Title  pt to be I with inital HEP    Baseline  met    Time  3    Period  Weeks    Status  Achieved        PT Long Term Goals - 08/14/19 1651      PT LONG TERM GOAL #1   Title  pt to increase bil hip gross strength to >/= 4+/5 in all planes to promote hip knee stability with walking/ standing    Baseline  4/5 abd/extension    Time  6    Period  Weeks    Status  Partially Met      PT LONG TERM GOAL #2   Title  pt to be able to naviage >/= 18 steps reciprocally with no report of pain or limation    Baseline  still with intermittent limitations    Time  6    Period  Weeks    Status  Not Met      PT LONG TERM GOAL #3   Title  pt to increase FOTO score </= 49% limited to demo improvement in function    Baseline  53% limited    Time  6    Period  Weeks    Status  Not Met      PT LONG TERM GOAL #4   Title  pt to be able to be I with all HEP given as of last visit to maintaining and progress current level of function    Baseline  met    Time  6    Period  Weeks    Status  Achieved            Plan - 08/14/19 1644    Clinical Impression Statement  Pt. has attended 15 therapy visits for current case with some breaks between visits earlier in  plan of care in July in August resulting in some delay and extension of plan of care. Fair progress with therapy with decreased pain in hips from previous status but continued intermittent symptoms with variable location between trochanteric region and lateral hip and thigh as well as some recent right anterior hip and groin  pain concerning for OA. We have run a course of treatment including modalities with iontophoresis to lateral hip as well as stretching, strengthening and manual tx. At this point have updated HEP and expect pt. can continue her progress independently and recommend MD follow up for any changes in status or unresolved/ongoing symptoms.    Personal Factors and Comorbidities  Age;Comorbidity 3+    Comorbidities  significant Surgical PMHx, hx of arthrits, DM, sickle cell trait    Examination-Activity Limitations  Stairs    Stability/Clinical Decision Making  Evolving/Moderate complexity    Clinical Decision Making  Moderate    Rehab Potential  Good    PT Frequency  2x / week    PT Duration  6 weeks    PT Treatment/Interventions  ADLs/Self Care Home Management;Cryotherapy;Electrical Stimulation;Moist Heat;Ultrasound;Therapeutic activities;Therapeutic exercise;Manual techniques;Passive range of motion;Dry needling;Taping;Iontophoresis '4mg'$ /ml Dexamethasone;Functional mobility training;Neuromuscular re-education;Patient/family education    PT Next Visit Plan  NA    PT Home Exercise Plan  standing vs. supine IT band stretch, piriformis stretch, hip abd with Theraband, squats, step ups, clamshell, hip bridge, roller use for IT band and hip/thigh    Consulted and Agree with Plan of Care  Patient       Patient will benefit from skilled therapeutic intervention in order to improve the following deficits and impairments:  Pain, Obesity, Decreased activity tolerance, Improper body mechanics, Postural dysfunction, Decreased strength, Decreased endurance  Visit Diagnosis: Pain in right hip  Pain in left hip  Muscle weakness (generalized)     Problem List Patient Active Problem List   Diagnosis Date Noted  . History of total knee replacement, left 04/03/2019  . History of total knee arthroplasty, right 04/03/2019  . DERMATOFIBROMA 10/26/2007  . ANEMIA, CHRONIC 10/26/2007  . SICKLE CELL TRAIT  10/26/2007  . ESSENTIAL HYPERTENSION 10/26/2007  . GASTROESOPHAGEAL REFLUX DISEASE 10/26/2007  . GASTRITIS 10/26/2007  . HIATAL HERNIA 10/26/2007  . CONSTIPATION, CHRONIC 10/26/2007  . GUAIAC POSITIVE STOOL 10/26/2007    Beaulah Dinning, PT, DPT 08/14/19 4:58 PM  [facility] [address] [place], [place], [address] Phone: [phone]   Fax:  [phone]  Name: Taylor Edwards MRN: [id number] Date of Birth: Mar 25, 1941

## 2019-09-01 ENCOUNTER — Other Ambulatory Visit: Payer: Self-pay

## 2019-09-01 DIAGNOSIS — Z20822 Contact with and (suspected) exposure to covid-19: Secondary | ICD-10-CM

## 2019-09-03 LAB — NOVEL CORONAVIRUS, NAA: SARS-CoV-2, NAA: NOT DETECTED

## 2019-10-13 IMAGING — MG DIGITAL SCREENING BILATERAL MAMMOGRAM WITH TOMO AND CAD
8 series · 8 of 24 positions shown · non-contrast
Comparison: Previous exam(s).

CLINICAL DATA: Screening.

EXAM:
DIGITAL SCREENING BILATERAL MAMMOGRAM WITH TOMO AND CAD

[R MLO synth-2D]
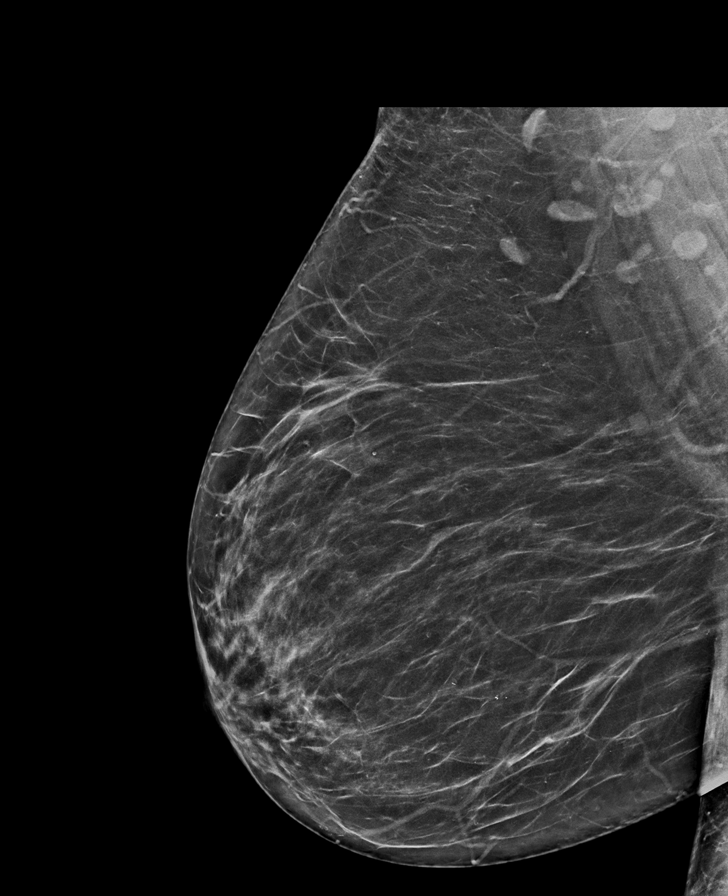

[L MLO synth-2D]
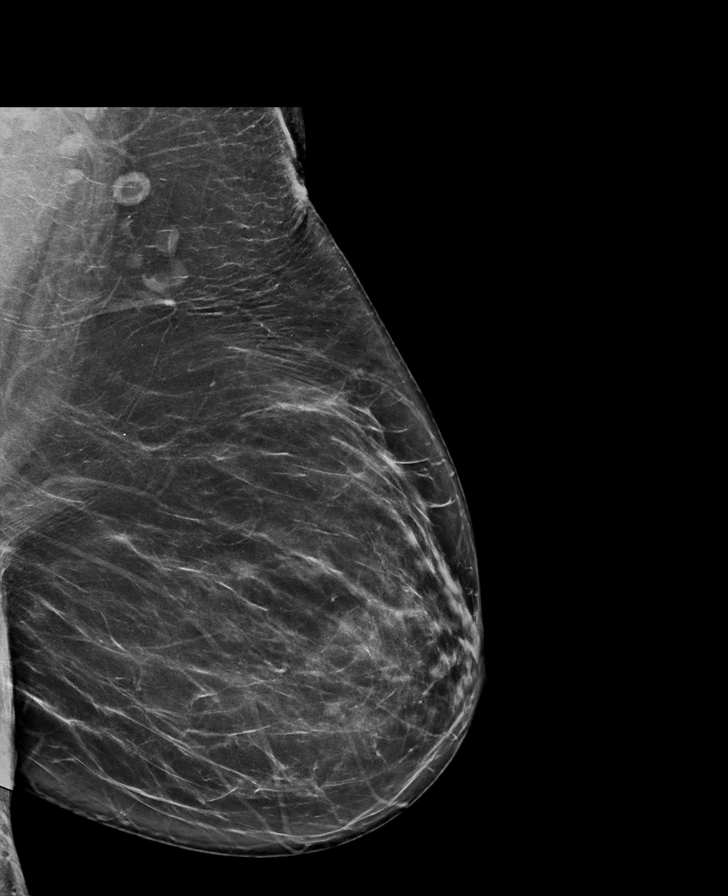

[L CC synth-2D]
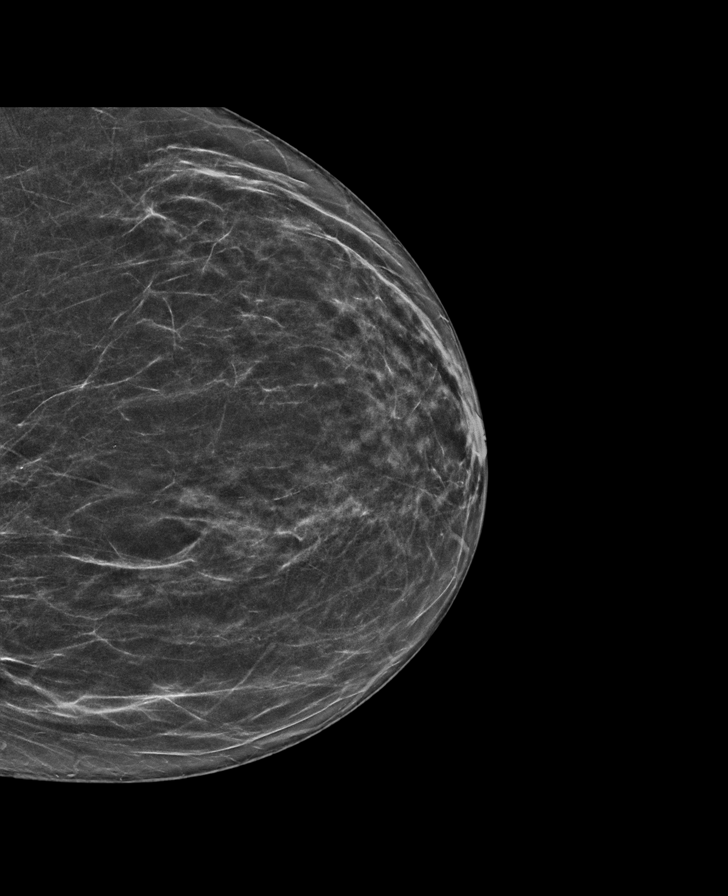

[R CC synth-2D]
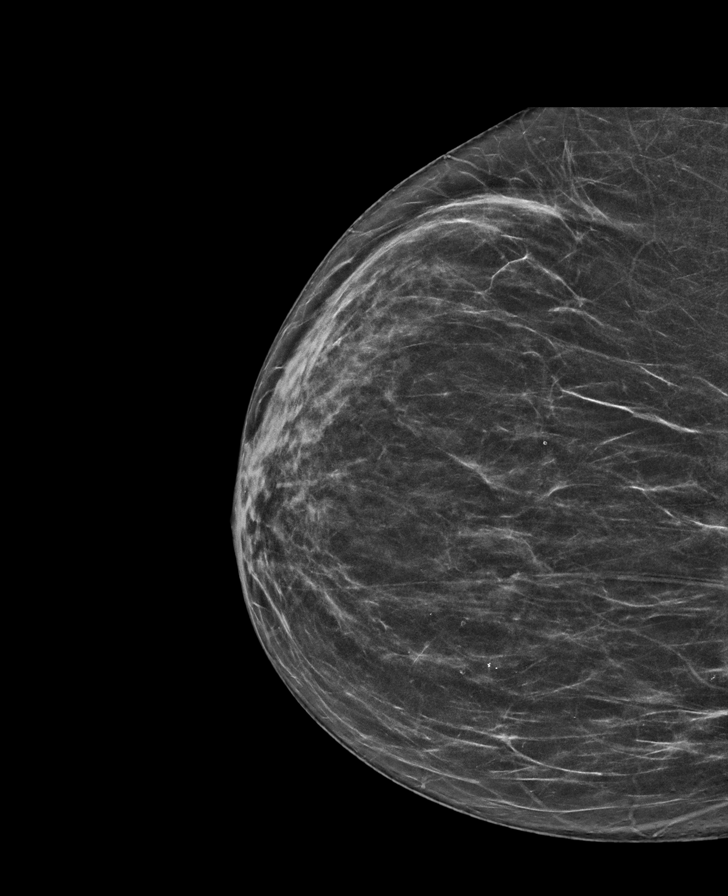

[R MLO tomo · tomo slice 41/81.0]
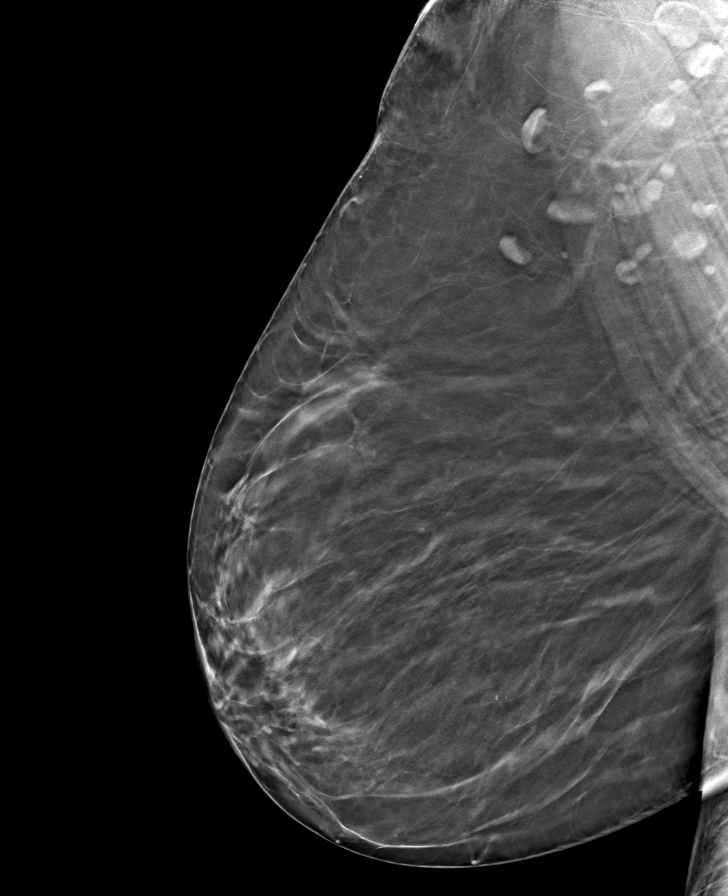

[L CC tomo · tomo slice 32/63.0]
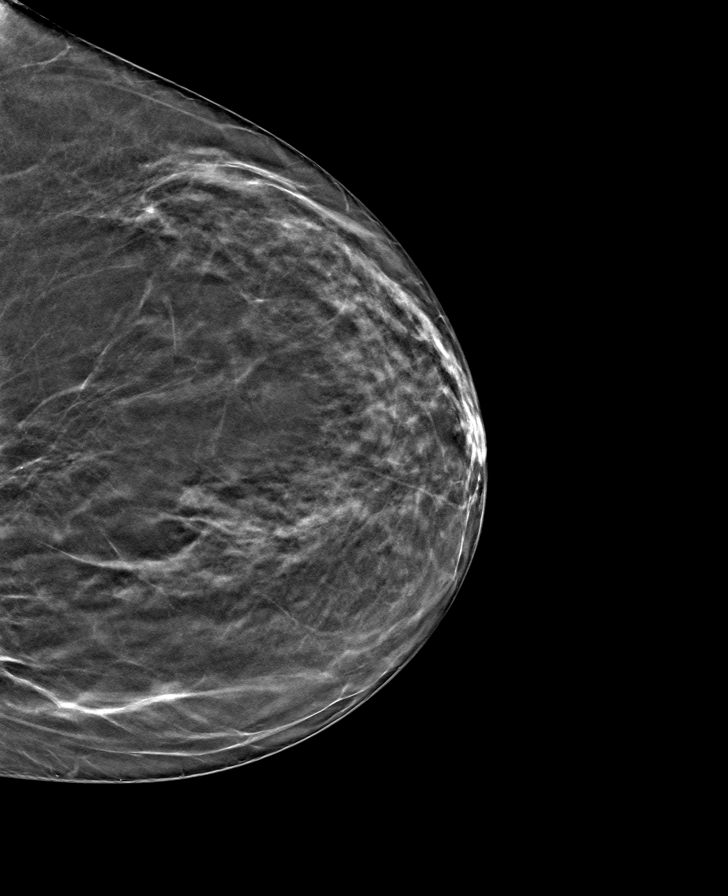

[L MLO tomo · tomo slice 42/83.0]
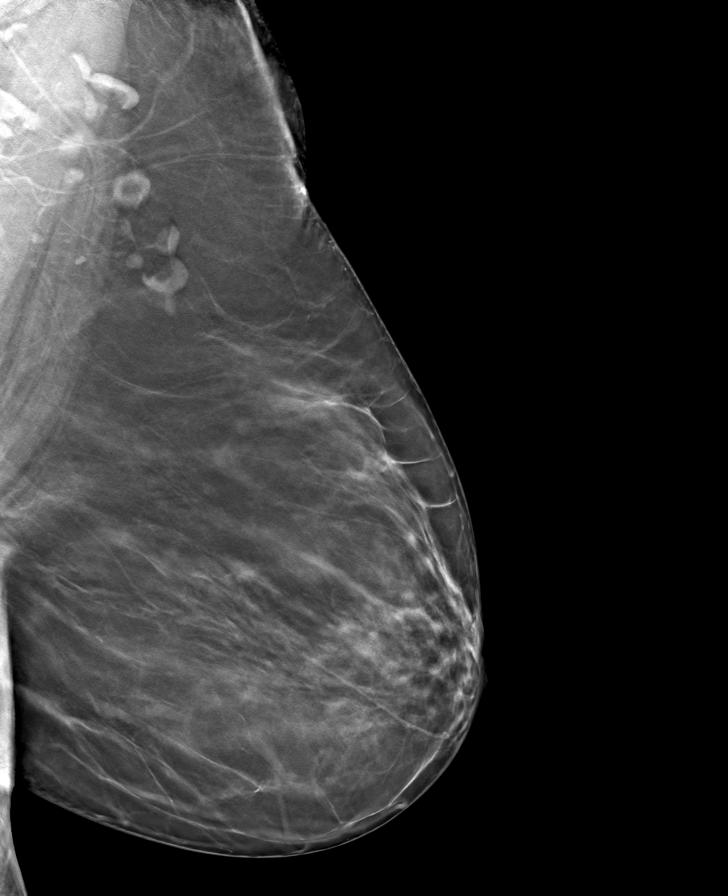

[R CC tomo · tomo slice 33/64.0]
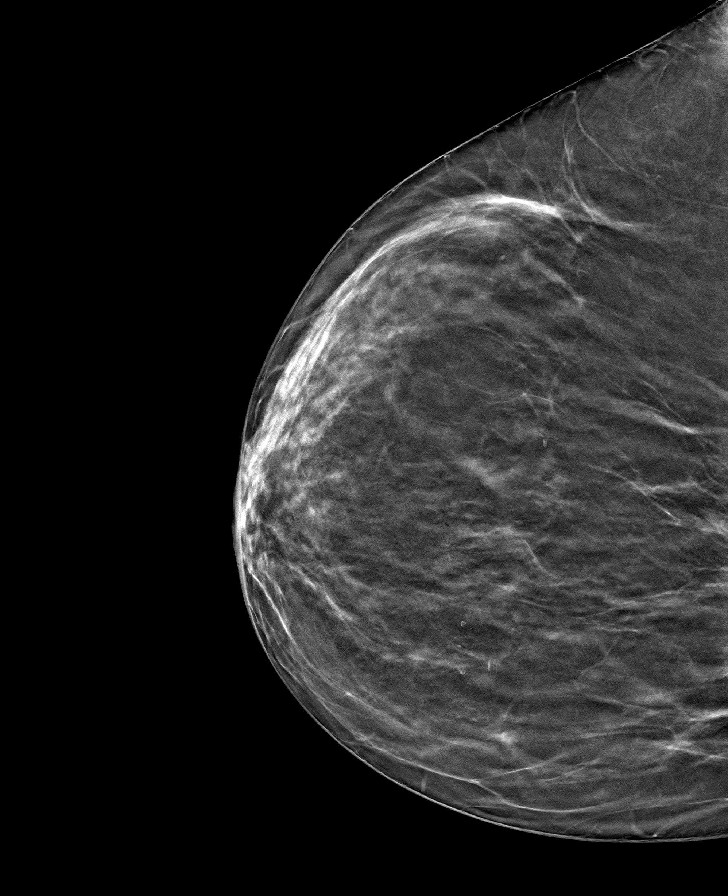

[8 of 24 positions shown; findings below may reference images not displayed]

ACR Breast Density Category b: There are scattered areas of
fibroglandular density.
FINDINGS: There are no findings suspicious for malignancy. Images were
processed with CAD.
IMPRESSION: No mammographic evidence of malignancy. A result letter of this
screening mammogram will be mailed directly to the patient.

RECOMMENDATION:
Screening mammogram in one year. (Code:CN-[id number])

BI-RADS CATEGORY  1: Negative.

## 2019-10-14 ENCOUNTER — Ambulatory Visit: Payer: BC Managed Care – PPO | Attending: Internal Medicine

## 2019-10-14 DIAGNOSIS — Z23 Encounter for immunization: Secondary | ICD-10-CM | POA: Insufficient documentation

## 2019-10-14 NOTE — Progress Notes (Signed)
   Covid-19 Vaccination Clinic  Name:  [name]    MRN: [id number] DOB: 07-Jul-1941  10/14/2019  [name] was observed post Covid-19 immunization for 15 minutes without incidence. She was provided with Vaccine Information Sheet and instruction to access the V-Safe system.   [name] was instructed to call 911 with any severe reactions post vaccine: Marland Kitchen Difficulty breathing  . Swelling of your face and throat  . A fast heartbeat  . A bad rash all over your body  . Dizziness and weakness    Immunizations Administered    Name Date Dose VIS Date Route   Pfizer COVID-19 Vaccine 10/14/2019 11:21 AM 0.3 mL 09/01/2019 Intramuscular   Manufacturer: [place]   Lot: [id number]   [place]: [id number]

## 2019-11-04 ENCOUNTER — Ambulatory Visit: Payer: BC Managed Care – PPO | Attending: Internal Medicine

## 2019-11-04 DIAGNOSIS — Z23 Encounter for immunization: Secondary | ICD-10-CM

## 2019-11-04 NOTE — Progress Notes (Signed)
   Covid-19 Vaccination Clinic  Name:  Dianah Hebel    MRN: [id number] DOB: [birth year]  11/04/2019  Ms. Bedingfield was observed post Covid-19 immunization for 15 minutes without incidence. She was provided with Vaccine Information Sheet and instruction to access the V-Safe system.   Ms. Raether was instructed to call 911 with any severe reactions post vaccine: Marland Kitchen Difficulty breathing  . Swelling of your face and throat  . A fast heartbeat  . A bad rash all over your body  . Dizziness and weakness    Immunizations Administered    Name Date Dose VIS Date Route   Pfizer COVID-19 Vaccine 11/04/2019  9:52 AM 0.3 mL 09/01/2019 Intramuscular   Manufacturer: [place]   Lot: [id number]   [place]: [id number]

## 2020-02-07 ENCOUNTER — Other Ambulatory Visit: Payer: Self-pay

## 2020-02-07 ENCOUNTER — Encounter: Payer: Self-pay | Admitting: Orthopaedic Surgery

## 2020-02-07 ENCOUNTER — Ambulatory Visit: Payer: Self-pay

## 2020-02-07 ENCOUNTER — Ambulatory Visit (INDEPENDENT_AMBULATORY_CARE_PROVIDER_SITE_OTHER): Payer: BC Managed Care – PPO | Admitting: Orthopaedic Surgery

## 2020-02-07 DIAGNOSIS — M25552 Pain in left hip: Secondary | ICD-10-CM

## 2020-02-07 DIAGNOSIS — M25551 Pain in right hip: Secondary | ICD-10-CM

## 2020-02-07 DIAGNOSIS — M7061 Trochanteric bursitis, right hip: Secondary | ICD-10-CM | POA: Diagnosis not present

## 2020-02-07 DIAGNOSIS — M7062 Trochanteric bursitis, left hip: Secondary | ICD-10-CM | POA: Diagnosis not present

## 2020-02-07 MED ORDER — METHYLPREDNISOLONE ACETATE 40 MG/ML IJ SUSP
40.0000 mg | INTRAMUSCULAR | Status: AC | PRN
  Administered 2020-02-07: 40 mg via INTRA_ARTICULAR

## 2020-02-07 MED ORDER — MELOXICAM 15 MG PO TABS: 15.0000 mg | ORAL_TABLET | Freq: Every day | ORAL | 1 refills | Status: DC | PRN

## 2020-02-07 MED ORDER — LIDOCAINE HCL 1 % IJ SOLN
3.0000 mL | INTRAMUSCULAR | Status: AC | PRN
  Administered 2020-02-07: 3 mL

## 2020-02-07 NOTE — Progress Notes (Signed)
Office Visit Note   Patient: Taylor Edwards           Date of Birth: 02/08/1941           MRN: [id number] Visit Date: 02/07/2020              Requested by: Lajean Manes, MD [address] [place],  [place] [address] PCP: Lajean Manes, MD   Assessment & Plan: Visit Diagnoses:  1. Pain in left hip   2. Pain in right hip   3. Trochanteric bursitis, left hip   4. Trochanteric bursitis, right hip     Plan: Her clinical exam and signs and symptoms are consistent with trochanteric bursitis.  I recommended a steroid injection only on the right side given her blood glucose levels.  I did provide the injection without difficulty and counseled her about how this may increase her blood glucose.  I showed her stretching exercises to try and have recommended Voltaren gel for these areas.  I also feel that she would benefit from outpatient physical therapy for any modalities that can help decrease the pain in both hips from her hip bursitis.  All questions and concerns were answered and addressed.  I will see her back in follow-up in about 6 weeks.  Follow-Up Instructions: Return in about 6 weeks (around 03/20/2020).   Orders:  Orders Placed This Encounter  Procedures  . Large Joint Inj: R greater trochanter  . XR HIPS BILAT W OR W/O PELVIS 2V   Meds ordered this encounter  Medications  . meloxicam (MOBIC) 15 MG tablet    Sig: Take 1 tablet (15 mg total) by mouth daily as needed for pain.    Dispense:  30 tablet    Refill:  1      Procedures: Large Joint Inj: R greater trochanter on 02/07/2020 11:33 AM Indications: pain and diagnostic evaluation Details: 22 G 1.5 in needle, lateral approach  Arthrogram: No  Medications: 3 mL lidocaine 1 %; 40 mg methylPREDNISolone acetate 40 MG/ML Outcome: tolerated well, no immediate complications Procedure, treatment alternatives, risks and benefits explained, specific risks discussed. Consent was given by the patient.  Immediately prior to procedure a time out was called to verify the correct patient, procedure, equipment, support staff and site/side marked as required. Patient was prepped and draped in the usual sterile fashion.       Clinical Data: No additional findings.   Subjective: Chief Complaint  Patient presents with  . Left Hip - Pain  . Right Hip - Pain  The patient comes in today with chief complaint of bilateral hip pain.  She points to the lateral aspect of her hip as a source of her pain.  It is worse on the right than the left.  She is very active at 79 years old and does work.  She does hurt laying on her side at night.  She denies any groin pain.  She is a diabetic and reports that there is a medication that has made her neuropathy go away.  She does report that her blood glucose is increased recently.  She denies any pain in the groin on either side.  She denies any back pain or radicular symptoms going down her legs and again her neuropathy is resolved.  HPI  Review of Systems She currently denies any headache, chest pain, shortness of breath, fever, chills, nausea, vomiting  Objective: Vital Signs: There were no vitals taken for this visit.  Physical Exam  She is alert and orient x3 and in no acute distress Ortho Exam Examination of both hips show the move smoothly and fluidly.  Her pain is only to palpation of the trochanteric area on both hips with the right worse than left.  The remainder of her lower extremity exam is normal.  She has negative straight leg raise bilaterally. Specialty Comments:  No specialty comments available.  Imaging: XR HIPS BILAT W OR W/O PELVIS 2V  Result Date: 02/07/2020 An AP pelvis and lateral of both hips shows well maintained hip joints bilaterally.  There is no acute findings.  Joint spaces good.  Where the patient is tender over the trochanteric areas is normal.    PMFS History: Patient Active Problem List   Diagnosis Date Noted  .  History of total knee replacement, left 04/03/2019  . History of total knee arthroplasty, right 04/03/2019  . DERMATOFIBROMA 10/26/2007  . ANEMIA, CHRONIC 10/26/2007  . SICKLE CELL TRAIT 10/26/2007  . ESSENTIAL HYPERTENSION 10/26/2007  . GASTROESOPHAGEAL REFLUX DISEASE 10/26/2007  . GASTRITIS 10/26/2007  . HIATAL HERNIA 10/26/2007  . CONSTIPATION, CHRONIC 10/26/2007  . GUAIAC POSITIVE STOOL 10/26/2007   Past Medical History:  Diagnosis Date  . Anemia    no current med.  . Arthritis    knees  . Asthma    no current meds.  . Cataract of both eyes    early  . Diabetes mellitus    only taking natural supplements  . GERD (gastroesophageal reflux disease)    no current med.  . Hypertension    has been on med. x 10 yrs.  . IBS (irritable bowel syndrome)   . Post-nasal drip    current cough  . Sickle cell trait ([place])   . Urinary urgency     Family History  Problem Relation Age of Onset  . Heart disease Mother   . Diabetes Other   . Hypertension Other   . Breast cancer Sister 90  . Colon cancer Neg Hx   . Stomach cancer Neg Hx     Past Surgical History:  Procedure Laterality Date  . ABDOMINAL HYSTERECTOMY    . BUNIONECTOMY  08/25/2011   Procedure: [name];  Surgeon: Hessie Dibble, MD;  Location: [facility];  Service: Orthopedics;  Laterality: Right;  . CATARACT EXTRACTION     both eyes  . COLONOSCOPY W/ BIOPSIES AND POLYPECTOMY    . DILATION AND CURETTAGE OF UTERUS    . INJECTION KNEE  08/25/2011   Procedure: KNEE INJECTION;  Surgeon: Hessie Dibble, MD;  Location: [facility];  Service: Orthopedics;  Laterality: Bilateral;  . JOINT REPLACEMENT    . KNEE ARTHROSCOPY  12/11/2008   bilat., with chondroplasty  . KNEE JOINT MANIPULATION  10/30/2009   bilat.  . OPEN REDUCTION INTERNAL FIXATION (ORIF) DISTAL RADIAL FRACTURE Left 11/17/2014   Procedure: OPEN REDUCTION INTERNAL FIXATION (ORIF) LEFT DISTAL RADIUS  FRACTURE;  Surgeon: Charlotte Crumb, MD;  Location: [facility];  Service: Orthopedics;  Laterality: Left;  . TOTAL KNEE ARTHROPLASTY  09/26/2009   bilat.   Social History   Occupational History  . Not on file  Tobacco Use  . Smoking status: Former Smoker    Types: Cigarettes    Quit date: 05/05/1985    Years since quitting: 34.7  . Smokeless tobacco: Never Used  Substance and Sexual Activity  . Alcohol use: Yes    Alcohol/week: 4.0 standard drinks    Types: 4 Glasses of wine  per week    Comment: wkends.  . Drug use: No  . Sexual activity: Not on file

## 2020-02-09 ENCOUNTER — Other Ambulatory Visit: Payer: Self-pay

## 2020-02-09 DIAGNOSIS — M7061 Trochanteric bursitis, right hip: Secondary | ICD-10-CM

## 2020-02-09 DIAGNOSIS — M7062 Trochanteric bursitis, left hip: Secondary | ICD-10-CM

## 2020-02-29 ENCOUNTER — Ambulatory Visit: Payer: BC Managed Care – PPO | Admitting: Physical Therapy

## 2020-03-13 ENCOUNTER — Encounter: Payer: Self-pay | Admitting: Physical Therapy

## 2020-03-13 ENCOUNTER — Ambulatory Visit: Payer: BC Managed Care – PPO | Attending: Orthopaedic Surgery | Admitting: Physical Therapy

## 2020-03-13 ENCOUNTER — Other Ambulatory Visit: Payer: Self-pay

## 2020-03-13 DIAGNOSIS — M25551 Pain in right hip: Secondary | ICD-10-CM

## 2020-03-13 DIAGNOSIS — M6281 Muscle weakness (generalized): Secondary | ICD-10-CM | POA: Diagnosis present

## 2020-03-13 DIAGNOSIS — R262 Difficulty in walking, not elsewhere classified: Secondary | ICD-10-CM

## 2020-03-13 DIAGNOSIS — M25552 Pain in left hip: Secondary | ICD-10-CM | POA: Diagnosis present

## 2020-03-13 NOTE — Therapy (Signed)
[place], [place], [address] Phone: [phone]   Fax:  [phone]  Physical Therapy Evaluation  Patient Details  Name: Taylor Edwards MRN: [id number] Date of Birth: 1941-01-02 Referring Provider (PT): Jean Rosenthal, MD   Encounter Date: 03/13/2020   PT End of Session - 03/13/20 1719    Visit Number 1    Number of Visits 12    Date for PT Re-Evaluation 04/24/20    Authorization Type BCBS/UHC Medicare, progress note by visit 80, recheck FOTO by visit 6    PT Start Time 1718    PT Stop Time 1758    PT Time Calculation (min) 40 min    Activity Tolerance Patient tolerated treatment well    Behavior During Therapy [facility] for tasks assessed/performed           Past Medical History:  Diagnosis Date  . Anemia    no current med.  . Arthritis    knees  . Asthma    no current meds.  . Cataract of both eyes    early  . Diabetes mellitus    only taking natural supplements  . GERD (gastroesophageal reflux disease)    no current med.  . Hypertension    has been on med. x 10 yrs.  . IBS (irritable bowel syndrome)   . Post-nasal drip    current cough  . Sickle cell trait ([place])   . Urinary urgency     Past Surgical History:  Procedure Laterality Date  . ABDOMINAL HYSTERECTOMY    . BUNIONECTOMY  08/25/2011   Procedure: [name];  Surgeon: Hessie Dibble, MD;  Location: [facility];  Service: Orthopedics;  Laterality: Right;  . CATARACT EXTRACTION     both eyes  . COLONOSCOPY W/ BIOPSIES AND POLYPECTOMY    . DILATION AND CURETTAGE OF UTERUS    . INJECTION KNEE  08/25/2011   Procedure: KNEE INJECTION;  Surgeon: Hessie Dibble, MD;  Location: [facility];  Service: Orthopedics;  Laterality: Bilateral;  . JOINT REPLACEMENT    . KNEE ARTHROSCOPY  12/11/2008   bilat., with chondroplasty  . KNEE JOINT MANIPULATION  10/30/2009   bilat.  . OPEN REDUCTION  INTERNAL FIXATION (ORIF) DISTAL RADIAL FRACTURE Left 11/17/2014   Procedure: OPEN REDUCTION INTERNAL FIXATION (ORIF) LEFT DISTAL RADIUS  FRACTURE;  Surgeon: Charlotte Crumb, MD;  Location: [facility];  Service: Orthopedics;  Laterality: Left;  . TOTAL KNEE ARTHROPLASTY  09/26/2009   bilat.    There were no vitals filed for this visit.    Subjective Assessment - 03/13/20 1739    Subjective Pt. is a 79 y/o female referred to PT for bilateral trochanteric bursitis. 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 Pain had been worse on right side but pt. noting this side feels improved after recent injection at MD visit. Primary functional complaint is limited standing and walking tolerance. Also difficulty with stair with contributing factor from knee stiffness.    Pertinent History Bilateral TKA, diabetic, HTN, IBS, sickle cell trait    Limitations Standing;Walking    Diagnostic tests X-rays    Patient Stated Goals Get hips better    Currently in Pain? Yes    Pain Score --   4/10 right side, 6/10 left  side   Pain Location Hip    Pain Orientation Right;Left;Lateral    Pain Descriptors / Indicators Dull;Sharp   left is more sharp, right is more dull   Pain Type Chronic pain    Pain Onset More than a month ago    Pain Frequency Constant    Aggravating Factors  standing and walking    Pain Relieving Factors rest    Effect of Pain on Daily Activities limits standing and walking tolerance              [facility] PT Assessment - 03/13/20 0001      Assessment   Medical Diagnosis Bilateral trochanteric bursitis    Referring Provider (PT) Jean Rosenthal, MD    Onset Date/Surgical Date 08/22/19   estimated-see subjective   Hand Dominance Right    Next MD Visit 03/20/2020    Prior  Therapy past PT last seen 11/20      Precautions   Precautions None      Restrictions   Weight Bearing Restrictions No      Balance Screen   Has the patient fallen in the past 6 months No      [place] residence    Living Arrangements Alone    Type of [place] to enter    Entrance Stairs-Number of Steps 4    Entrance Stairs-Rails Right;Left    Home Layout Two level    Alternate Level Stairs-Number of Steps --   1 flight   Alternate Level Stairs-Rails Right      Prior Function   Level of Independence Independent with community mobility without device      Cognition   Overall Cognitive Status Within Functional Limits for tasks assessed      Observation/Other Assessments   Focus on Therapeutic Outcomes (FOTO)  62% limited      ROM / Strength   AROM / PROM / Strength AROM;Strength      AROM   AROM Assessment Site Hip;Knee    Right/Left Hip Right;Left    Right Hip Flexion 80    Right Hip External Rotation  32    Right Hip Internal Rotation  20    Right Hip ABduction 45    Right Hip ADduction --   WFL   Left Hip Flexion 90    Left Hip External Rotation  50    Left Hip Internal Rotation  25    Left Hip ABduction 40    Left Hip ADduction --   [facility]   Right/Left Knee Right;Left    Right Knee Extension -8   lacking 8 deg from neutral   Right Knee Flexion 102    Left Knee Extension -5   lacking 5 deg from neutral   Left Knee Flexion 100      Strength   Strength Assessment Site Hip;Knee    Right/Left Hip Right;Left    Right Hip Flexion 4+/5    Right Hip Extension 4/5    Right Hip External Rotation  4+/5    Right Hip Internal Rotation 5/5    Right Hip ABduction 4-/5    Right Hip ADduction 4+/5    Left Hip Flexion 5/5    Left Hip Extension 4/5    Left Hip External Rotation 5/5    Left Hip Internal Rotation 5/5    Left Hip ABduction 4/5    Left Hip ADduction 4+/5    Right/Left Knee Right;Left  Right  Knee Flexion 5/5    Right Knee Extension 5/5    Left Knee Flexion 5/5    Left Knee Extension 5/5      Flexibility   Soft Tissue Assessment /Muscle Length --   tight hip rotators bilat.     Palpation   Palpation comment Tender to palpation bilateral greater trochanteric and gluteus medius region      Ambulation/Gait   Gait Comments Pt. ambulates independently without AD with mild antalgia and Trendelenburg                      Objective measurements completed on examination: See above findings.       [facility] Adult PT Treatment/Exercise - 03/13/20 0001      Exercises   Exercises --   HEP handout review                 PT Education - 03/13/20 1811    Education Details symptom etiology with hip anatomy, HEP, POC, FOTO patient report    Person(s) Educated Patient    Methods Explanation;Demonstration;Verbal cues;Handout    Comprehension Verbalized understanding               PT Long Term Goals - 03/13/20 1818      PT LONG TERM GOAL #1   Title Independent and compliant with HEP performance    Baseline instructed today    Time 6    Period Weeks    Status New    Target Date 04/24/20      PT LONG TERM GOAL #2   Title Improve FOTO outcome measure score to 45% or less impairment    Baseline 62% limited    Time 6    Period Weeks    Status New    Target Date 04/24/20      PT LONG TERM GOAL #3   Title Increase hip abduction strength at least 1/2 MMT grade bilat. to improve gait mechanics to decrease pain with walking    Baseline right 4-/5, left 4/5    Time 6    Period Weeks    Status New    Target Date 04/24/20      PT LONG TERM GOAL #4   Title Tolerate standing and walking periods at least 20-30 min for community ambulation and activities such as cooking with hip pain 3/10 or less bilat.    Baseline left 6/10, right 4/10 at rest at eval, reports limited standing and walking tolerance to "about 50 feet" bedore high pain level    Time 6     Period Weeks    Status New    Target Date 04/24/20      PT LONG TERM GOAL #5   Title Ascend/descend stairs to second story of home with reciprocal gait    Baseline has to use single step pattern    Time 6    Period Weeks    Status New    Target Date 04/24/20                  Plan - 03/13/20 1812    Clinical Impression Statement Pt. presents with bilateral lateral hip pain consistent with referring diagnosis trochanteric bursitis with hip abductor weakness contributing to altered gait mechanics with mild Trendelenburg and also with associated muscle tightness. Pt. would benefit from PT to help relieve pain and improve functional activity tolerance for standing and walking.    Personal Factors and Comorbidities Time since onset of  injury/illness/exacerbation;Comorbidity 3+;Past/Current Experience    Comorbidities diabetic with neuropathy, sickle cell trait, surgical history for knees    Examination-Activity Limitations Stand;Locomotion Level;Stairs    Examination-Participation Restrictions Shop;Community Activity;Cleaning;Meal Prep    Stability/Clinical Decision Making Evolving/Moderate complexity    Clinical Decision Making Moderate    Rehab Potential Good    PT Frequency 2x / week    PT Duration 6 weeks    PT Treatment/Interventions Cryotherapy;ADLs/Self Care Home Management;Ultrasound;Electrical Stimulation;Iontophoresis 4mg /ml Dexamethasone;Moist Heat;Therapeutic activities;Functional mobility training;Stair training;Gait training;Therapeutic exercise;Patient/family education;Neuromuscular re-education;Dry needling;Manual techniques;Taping    PT Next Visit Plan Review HEP as needed, work on hip strengthening with closed and open chain activities pending pain, glut + IT band and piriformis stretches, ionto trochanteric region, manual/roller to lateral hips, [place] prn    PT Home Exercise Plan Access Code: [facility]    Consulted and Agree with Plan of Care Patient            Patient will benefit from skilled therapeutic intervention in order to improve the following deficits and impairments:  Pain, Impaired flexibility, Decreased strength, Decreased activity tolerance, Decreased range of motion, Abnormal gait, Difficulty walking  Visit Diagnosis: Pain in right hip  Pain in left hip  Muscle weakness (generalized)  Difficulty in walking, not elsewhere classified     Problem List Patient Active Problem List   Diagnosis Date Noted  . History of total knee replacement, left 04/03/2019  . History of total knee arthroplasty, right 04/03/2019  . DERMATOFIBROMA 10/26/2007  . ANEMIA, CHRONIC 10/26/2007  . SICKLE CELL TRAIT 10/26/2007  . ESSENTIAL HYPERTENSION 10/26/2007  . GASTROESOPHAGEAL REFLUX DISEASE 10/26/2007  . GASTRITIS 10/26/2007  . HIATAL HERNIA 10/26/2007  . CONSTIPATION, CHRONIC 10/26/2007  . GUAIAC POSITIVE STOOL 10/26/2007    Beaulah Dinning, PT, DPT 03/13/20 6:24 PM  [facility] [address] [place], [place], [address] Phone: [phone]   Fax:  [phone]  Name: Taylor Edwards MRN: [id number] Date of Birth: December 25, 1940

## 2020-03-20 ENCOUNTER — Ambulatory Visit: Payer: BC Managed Care – PPO | Admitting: Orthopaedic Surgery

## 2020-04-01 ENCOUNTER — Ambulatory Visit: Payer: BC Managed Care – PPO | Admitting: Orthopaedic Surgery

## 2020-04-03 ENCOUNTER — Other Ambulatory Visit: Payer: Self-pay

## 2020-04-03 ENCOUNTER — Ambulatory Visit: Payer: BC Managed Care – PPO | Attending: Orthopaedic Surgery | Admitting: Physical Therapy

## 2020-04-03 ENCOUNTER — Encounter: Payer: Self-pay | Admitting: Physical Therapy

## 2020-04-03 DIAGNOSIS — M25551 Pain in right hip: Secondary | ICD-10-CM | POA: Insufficient documentation

## 2020-04-03 DIAGNOSIS — R262 Difficulty in walking, not elsewhere classified: Secondary | ICD-10-CM | POA: Insufficient documentation

## 2020-04-03 DIAGNOSIS — M6281 Muscle weakness (generalized): Secondary | ICD-10-CM | POA: Insufficient documentation

## 2020-04-03 DIAGNOSIS — M25552 Pain in left hip: Secondary | ICD-10-CM | POA: Diagnosis present

## 2020-04-03 NOTE — Therapy (Signed)
[place], [place], [address] Phone: [phone]   Fax:  [phone]  Physical Therapy Treatment  Patient Details  Name: Taylor Edwards MRN: [id number] Date of Birth: 1941-06-07 Referring Provider (PT): Jean Rosenthal, MD   Encounter Date: 04/03/2020   PT End of Session - 04/03/20 1633    Visit Number 2    Number of Visits 12    Date for PT Re-Evaluation 04/24/20    Authorization Type BCBS/UHC Medicare, progress note by visit 66, recheck FOTO by visit 6    PT Start Time 1632    PT Stop Time 1715    PT Time Calculation (min) 43 min    Activity Tolerance Patient tolerated treatment well    Behavior During Therapy [facility] for tasks assessed/performed           Past Medical History:  Diagnosis Date  . Anemia    no current med.  . Arthritis    knees  . Asthma    no current meds.  . Cataract of both eyes    early  . Diabetes mellitus    only taking natural supplements  . GERD (gastroesophageal reflux disease)    no current med.  . Hypertension    has been on med. x 10 yrs.  . IBS (irritable bowel syndrome)   . Post-nasal drip    current cough  . Sickle cell trait ([place])   . Urinary urgency     Past Surgical History:  Procedure Laterality Date  . ABDOMINAL HYSTERECTOMY    . BUNIONECTOMY  08/25/2011   Procedure: [name];  Surgeon: Hessie Dibble, MD;  Location: [place];  Service: Orthopedics;  Laterality: Right;  . CATARACT EXTRACTION     both eyes  . COLONOSCOPY W/ BIOPSIES AND POLYPECTOMY    . DILATION AND CURETTAGE OF UTERUS    . INJECTION KNEE  08/25/2011   Procedure: KNEE INJECTION;  Surgeon: Hessie Dibble, MD;  Location: [place];  Service: Orthopedics;  Laterality: Bilateral;  . JOINT REPLACEMENT    . KNEE ARTHROSCOPY  12/11/2008   bilat., with chondroplasty  . KNEE JOINT MANIPULATION  10/30/2009   bilat.  . OPEN REDUCTION  INTERNAL FIXATION (ORIF) DISTAL RADIAL FRACTURE Left 11/17/2014   Procedure: OPEN REDUCTION INTERNAL FIXATION (ORIF) LEFT DISTAL RADIUS  FRACTURE;  Surgeon: Charlotte Crumb, MD;  Location: [place];  Service: Orthopedics;  Laterality: Left;  . TOTAL KNEE ARTHROPLASTY  09/26/2009   bilat.    There were no vitals filed for this visit.   Subjective Assessment - 04/03/20 1634    Subjective "I took some meloxicam this morning, I am feeling better. My back was bother my the other day but today is better"    Currently in Pain? Yes    Pain Score 3     Pain Location Hip    Pain Orientation Right;Left    Pain Descriptors / Indicators Aching    Pain Type Chronic pain    Pain Onset More than a month ago    Pain Frequency Intermittent    Aggravating Factors  walking/ standing, laying down    Pain Relieving Factors resting              [facility] PT Assessment - 04/03/20 0001      Assessment   Medical Diagnosis Bilateral trochanteric bursitis    Referring Provider (PT) Jean Rosenthal, MD  [facility] Adult PT Treatment/Exercise - 04/03/20 0001      Knee/Hip Exercises: Aerobic   Nustep L5 x 6 min LE only      Knee/Hip Exercises: Standing   Hip Abduction 20 reps;Knee straight;3 sets;Both;Stengthening   with bil HHA from chair     Knee/Hip Exercises: Sidelying   Hip ABduction 1 set;15 reps      Modalities   Modalities Iontophoresis      Iontophoresis   Type of Iontophoresis Dexamethasone    Location bil greater trochanter    Dose 4mg /ml x 2    Time 6 hour patch      Manual Therapy   Manual Therapy Soft tissue mobilization    Manual therapy comments MTPR along bil glute med x 2 ea.    Soft tissue mobilization DTM along bil glute med                  PT Education - 04/03/20 1724    Education Details reviewed benefits of iontophoresis. discussed wit pt that given her increased swelling in bil ankles/ knees and notes she is taking extra of  her HCTZ that she needs to consult her MD before going over the recommended dosage and may potentially benefit from seeing her cardiologist.    Person(s) Educated Patient    Methods Explanation;Verbal cues    Comprehension Verbalized understanding;Verbal cues required               PT Long Term Goals - 03/13/20 1818      PT LONG TERM GOAL #1   Title Independent and compliant with HEP performance    Baseline instructed today    Time 6    Period Weeks    Status New    Target Date 04/24/20      PT LONG TERM GOAL #2   Title Improve FOTO outcome measure score to 45% or less impairment    Baseline 62% limited    Time 6    Period Weeks    Status New    Target Date 04/24/20      PT LONG TERM GOAL #3   Title Increase hip abduction strength at least 1/2 MMT grade bilat. to improve gait mechanics to decrease pain with walking    Baseline right 4-/5, left 4/5    Time 6    Period Weeks    Status New    Target Date 04/24/20      PT LONG TERM GOAL #4   Title Tolerate standing and walking periods at least 20-30 min for community ambulation and activities such as cooking with hip pain 3/10 or less bilat.    Baseline left 6/10, right 4/10 at rest at eval, reports limited standing and walking tolerance to "about 50 feet" bedore high pain level    Time 6    Period Weeks    Status New    Target Date 04/24/20      PT LONG TERM GOAL #5   Title Ascend/descend stairs to second story of home with reciprocal gait    Baseline has to use single step pattern    Time 6    Period Weeks    Status New    Target Date 04/24/20                 Plan - 04/03/20 1727    Clinical Impression Statement pt reports she is doing better today with decreased pain noting exercise and medication she took earlier today. she reports issues  with bil LE swelling that she is taking her BP medication to reduce but takes extra which I discussed she needs to consult her MD before going over recommended dosage.  Continued loading the hip abductors with increased reps to increaesd endurance.    PT Treatment/Interventions Cryotherapy;ADLs/Self Care Home Management;Ultrasound;Electrical Stimulation;Iontophoresis 4mg /ml Dexamethasone;Moist Heat;Therapeutic activities;Functional mobility training;Stair training;Gait training;Therapeutic exercise;Patient/family education;Neuromuscular re-education;Dry needling;Manual techniques;Taping    PT Next Visit Plan Review HEP as needed, work on hip strengthening with closed and open chain activities pending pain, glut + IT band and piriformis stretches res, Response to  ionto trochanteric region, manual/roller to lateral hips, [place] prn    PT Home Exercise Plan Access Code: [facility]           Patient will benefit from skilled therapeutic intervention in order to improve the following deficits and impairments:  Pain, Impaired flexibility, Decreased strength, Decreased activity tolerance, Decreased range of motion, Abnormal gait, Difficulty walking  Visit Diagnosis: Pain in right hip  Pain in left hip  Muscle weakness (generalized)  Difficulty in walking, not elsewhere classified     Problem List Patient Active Problem List   Diagnosis Date Noted  . History of total knee replacement, left 04/03/2019  . History of total knee arthroplasty, right 04/03/2019  . DERMATOFIBROMA 10/26/2007  . ANEMIA, CHRONIC 10/26/2007  . SICKLE CELL TRAIT 10/26/2007  . ESSENTIAL HYPERTENSION 10/26/2007  . GASTROESOPHAGEAL REFLUX DISEASE 10/26/2007  . GASTRITIS 10/26/2007  . HIATAL HERNIA 10/26/2007  . CONSTIPATION, CHRONIC 10/26/2007  . GUAIAC POSITIVE STOOL 10/26/2007    Starr Lake PT, DPT, LAT, ATC  04/03/20  5:38 PM      [facility] [address] [place], [place], [address] Phone: [phone]   Fax:  [phone]  Name: Taylor Edwards MRN: [id number] Date of Birth: 07-02-1941

## 2020-04-12 ENCOUNTER — Other Ambulatory Visit: Payer: Self-pay | Admitting: Geriatric Medicine

## 2020-04-12 DIAGNOSIS — Z1231 Encounter for screening mammogram for malignant neoplasm of breast: Secondary | ICD-10-CM

## 2020-04-17 ENCOUNTER — Ambulatory Visit: Payer: BC Managed Care – PPO | Admitting: Orthopaedic Surgery

## 2020-04-19 ENCOUNTER — Other Ambulatory Visit: Payer: Self-pay

## 2020-04-19 ENCOUNTER — Encounter: Payer: Self-pay | Admitting: Physical Therapy

## 2020-04-19 ENCOUNTER — Ambulatory Visit: Payer: BC Managed Care – PPO | Admitting: Physical Therapy

## 2020-04-19 DIAGNOSIS — M25552 Pain in left hip: Secondary | ICD-10-CM

## 2020-04-19 DIAGNOSIS — M25551 Pain in right hip: Secondary | ICD-10-CM

## 2020-04-19 DIAGNOSIS — R262 Difficulty in walking, not elsewhere classified: Secondary | ICD-10-CM

## 2020-04-19 DIAGNOSIS — M6281 Muscle weakness (generalized): Secondary | ICD-10-CM

## 2020-04-19 NOTE — Therapy (Signed)
[place], [place], [address] Phone: [phone]   Fax:  [phone]  Physical Therapy Treatment  Patient Details  Name: Taylor Edwards MRN: [id number] Date of Birth: May 29, 1941 Referring Provider (PT): Jean Rosenthal, MD   Encounter Date: 04/19/2020   PT End of Session - 04/19/20 1114    Visit Number 3    Number of Visits 12    Date for PT Re-Evaluation 04/24/20    Authorization Type BCBS/UHC Medicare, progress note by visit 38, recheck FOTO by visit 6    PT Start Time 1035   pt. arrived late for 10:15 appointment   PT Stop Time 1059    PT Time Calculation (min) 24 min    Activity Tolerance Patient tolerated treatment well    Behavior During Therapy [facility] for tasks assessed/performed           Past Medical History:  Diagnosis Date  . Anemia    no current med.  . Arthritis    knees  . Asthma    no current meds.  . Cataract of both eyes    early  . Diabetes mellitus    only taking natural supplements  . GERD (gastroesophageal reflux disease)    no current med.  . Hypertension    has been on med. x 10 yrs.  . IBS (irritable bowel syndrome)   . Post-nasal drip    current cough  . Sickle cell trait ([place])   . Urinary urgency     Past Surgical History:  Procedure Laterality Date  . ABDOMINAL HYSTERECTOMY    . BUNIONECTOMY  08/25/2011   Procedure: [name];  Surgeon: Hessie Dibble, MD;  Location: [place];  Service: Orthopedics;  Laterality: Right;  . CATARACT EXTRACTION     both eyes  . COLONOSCOPY W/ BIOPSIES AND POLYPECTOMY    . DILATION AND CURETTAGE OF UTERUS    . INJECTION KNEE  08/25/2011   Procedure: KNEE INJECTION;  Surgeon: Hessie Dibble, MD;  Location: [place];  Service: Orthopedics;  Laterality: Bilateral;  . JOINT REPLACEMENT    . KNEE ARTHROSCOPY  12/11/2008   bilat., with chondroplasty  . KNEE JOINT MANIPULATION   10/30/2009   bilat.  . OPEN REDUCTION INTERNAL FIXATION (ORIF) DISTAL RADIAL FRACTURE Left 11/17/2014   Procedure: OPEN REDUCTION INTERNAL FIXATION (ORIF) LEFT DISTAL RADIUS  FRACTURE;  Surgeon: Charlotte Crumb, MD;  Location: [place];  Service: Orthopedics;  Laterality: Left;  . TOTAL KNEE ARTHROPLASTY  09/26/2009   bilat.    There were no vitals filed for this visit.   Subjective Assessment - 04/19/20 1049    Subjective Session was abbreviated today due to late arrival by pt. with some mix up re: appointment time. Pt. reports having pain in her lower legs (history neuropathy) today 6/10 worse on right in region of tattoo where she had previous skin injury. Hip pain 4/10 bilat.. She continues to have issues with bilateral LE edema most notable in her ankles and also reports has been having some shortness of breath. She denies any chest pain symptoms. Recommended follow up with PCP regarding these symptoms to assess/concern for cardiovascular etiology.    Pertinent History Bilateral TKA, diabetic, HTN, IBS, sickle cell trait                             [facility] Adult PT Treatment/Exercise - 04/19/20 0001  Knee/Hip Exercises: Stretches   ITB Stretch Right;Left;2 reps;30 seconds    Piriformis Stretch Right;Left;2 reps;30 seconds      Knee/Hip Exercises: Standing   Hip Abduction AROM;Stengthening;Both;2 sets;10 reps    Abduction Limitations Green band     Forward Step Up Right;Left;15 reps;Hand Hold: 1;Step Height: 6"    Forward Step Up Limitations with opp hip hike    Other Standing Knee Exercises hip hike from 4 in . step x 15 reps ea. bilat.      Knee/Hip Exercises: Supine   Bridges with Clamshell AROM;Strengthening;Both;20 reps   green band   Other Supine Knee/Hip Exercises clamshell green band 2x10      Manual Therapy   Soft tissue mobilization brief IASTM with foam roll use bilateral lateral hip/IT band region                  PT Education - 04/19/20 1111     Education Details follow up with PCP re: bilateral LE swelling and SOB    Person(s) Educated Patient    Methods Explanation    Comprehension Verbalized understanding               PT Long Term Goals - 03/13/20 1818      PT LONG TERM GOAL #1   Title Independent and compliant with HEP performance    Baseline instructed today    Time 6    Period Weeks    Status New    Target Date 04/24/20      PT LONG TERM GOAL #2   Title Improve FOTO outcome measure score to 45% or less impairment    Baseline 62% limited    Time 6    Period Weeks    Status New    Target Date 04/24/20      PT LONG TERM GOAL #3   Title Increase hip abduction strength at least 1/2 MMT grade bilat. to improve gait mechanics to decrease pain with walking    Baseline right 4-/5, left 4/5    Time 6    Period Weeks    Status New    Target Date 04/24/20      PT LONG TERM GOAL #4   Title Tolerate standing and walking periods at least 20-30 min for community ambulation and activities such as cooking with hip pain 3/10 or less bilat.    Baseline left 6/10, right 4/10 at rest at eval, reports limited standing and walking tolerance to "about 50 feet" bedore high pain level    Time 6    Period Weeks    Status New    Target Date 04/24/20      PT LONG TERM GOAL #5   Title Ascend/descend stairs to second story of home with reciprocal gait    Baseline has to use single step pattern    Time 6    Period Weeks    Status New    Target Date 04/24/20                 Plan - 04/19/20 1136    Clinical Impression Statement Abbreviated session due to late arrival as noted in subjective. Fair status regarding hip pain with ongoing symptoms. Concern re: continued LE swelling as noted last visit-recommended to schedule follow up with PCP to assess and to address any issues with medication as she had been previously taking more than recommended dose of BP medication to help with swelling.    Personal Factors and  Comorbidities Time  since onset of injury/illness/exacerbation;Comorbidity 3+;Past/Current Experience    Comorbidities diabetic with neuropathy, sickle cell trait, surgical history for knees    Examination-Activity Limitations Stand;Locomotion Level;Stairs    Examination-Participation Restrictions Shop;Community Activity;Cleaning;Meal Prep    PT Frequency 2x / week    PT Duration 6 weeks    PT Treatment/Interventions Cryotherapy;ADLs/Self Care Home Management;Ultrasound;Electrical Stimulation;Iontophoresis 4mg /ml Dexamethasone;Moist Heat;Therapeutic activities;Functional mobility training;Stair training;Gait training;Therapeutic exercise;Patient/family education;Neuromuscular re-education;Dry needling;Manual techniques;Taping    PT Next Visit Plan Review HEP as needed, work on hip strengthening with closed and open chain activities pending pain, glut + IT band and piriformis stretches res, Response to  ionto trochanteric region, manual/roller to lateral hips, [place] prn    PT Home Exercise Plan Access Code: [facility]    Consulted and Agree with Plan of Care Patient           Patient will benefit from skilled therapeutic intervention in order to improve the following deficits and impairments:  Pain, Impaired flexibility, Decreased strength, Decreased activity tolerance, Decreased range of motion, Abnormal gait, Difficulty walking  Visit Diagnosis: Pain in right hip  Pain in left hip  Muscle weakness (generalized)  Difficulty in walking, not elsewhere classified     Problem List Patient Active Problem List   Diagnosis Date Noted  . History of total knee replacement, left 04/03/2019  . History of total knee arthroplasty, right 04/03/2019  . DERMATOFIBROMA 10/26/2007  . ANEMIA, CHRONIC 10/26/2007  . SICKLE CELL TRAIT 10/26/2007  . ESSENTIAL HYPERTENSION 10/26/2007  . GASTROESOPHAGEAL REFLUX DISEASE 10/26/2007  . GASTRITIS 10/26/2007  . HIATAL HERNIA 10/26/2007  . CONSTIPATION, CHRONIC  10/26/2007  . GUAIAC POSITIVE STOOL 10/26/2007    Beaulah Dinning, PT, DPT 04/19/20 11:44 AM  [facility] [address] [place], [place], [address] Phone: [phone]   Fax:  [phone]  Name: Juanisha Bautch MRN: [id number] Date of Birth: Aug 13, 1941

## 2020-04-22 ENCOUNTER — Ambulatory Visit (INDEPENDENT_AMBULATORY_CARE_PROVIDER_SITE_OTHER): Payer: BC Managed Care – PPO | Admitting: Physician Assistant

## 2020-04-22 ENCOUNTER — Encounter: Payer: Self-pay | Admitting: Orthopaedic Surgery

## 2020-04-22 ENCOUNTER — Other Ambulatory Visit: Payer: Self-pay

## 2020-04-22 VITALS — Ht 62.0 in | Wt 201.0 lb

## 2020-04-22 DIAGNOSIS — M545 Low back pain, unspecified: Secondary | ICD-10-CM

## 2020-04-22 DIAGNOSIS — M7061 Trochanteric bursitis, right hip: Secondary | ICD-10-CM

## 2020-04-22 DIAGNOSIS — M7062 Trochanteric bursitis, left hip: Secondary | ICD-10-CM | POA: Diagnosis not present

## 2020-04-22 DIAGNOSIS — G8929 Other chronic pain: Secondary | ICD-10-CM | POA: Diagnosis not present

## 2020-04-22 MED ORDER — METHYLPREDNISOLONE ACETATE 40 MG/ML IJ SUSP
40.0000 mg | INTRAMUSCULAR | Status: AC | PRN
  Administered 2020-04-22: 40 mg via INTRA_ARTICULAR

## 2020-04-22 MED ORDER — LIDOCAINE HCL 1 % IJ SOLN
3.0000 mL | INTRAMUSCULAR | Status: AC | PRN
  Administered 2020-04-22: 3 mL

## 2020-04-22 NOTE — Progress Notes (Signed)
Office Visit Note   Patient: Taylor Edwards           Date of Birth: 1941-04-25           MRN: [id number] Visit Date: 04/22/2020              Requested by: Lajean Manes, MD [address] [place],  [place] [address] PCP: Lajean Manes, MD   Assessment & Plan: Visit Diagnoses:  1. Trochanteric bursitis of both hips   2. Chronic midline low back pain, unspecified whether sciatica present     Plan: Due to her low back pain we will add back exercises, core strengthening home exercise and modalities to her PT prescription.  She will continue physical therapy for IT band stretching.  Questions were encouraged and answered.  Follow-up with [place] on as-needed basis pain persist or becomes worse.  Advised her that due to her lower leg swelling which she has known venous insufficiency would recommend compression hose and gave her some recommendations on where she could be fitted for these.  Follow-Up Instructions: Return if symptoms worsen or fail to improve.   Orders:  Orders Placed This Encounter  Procedures  . Large Joint Inj: L greater trochanter   No orders of the defined types were placed in this encounter.     Procedures: Large Joint Inj: L greater trochanter on 04/22/2020 4:38 PM Indications: pain Details: 22 G 1.5 in needle, lateral approach  Arthrogram: No  Medications: 3 mL lidocaine 1 %; 40 mg methylPREDNISolone acetate 40 MG/ML Outcome: tolerated well, no immediate complications Procedure, treatment alternatives, risks and benefits explained, specific risks discussed. Consent was given by the patient. Immediately prior to procedure a time out was called to verify the correct patient, procedure, equipment, support staff and site/side marked as required. Patient was prepped and draped in the usual sterile fashion.       Clinical Data: No additional findings.   Subjective: Chief Complaint  Patient presents with  . Right Hip - Follow-up  . Left  Hip - Follow-up    HPI Taylor Edwards returns today follow-up of her trochanteric bursitis bilaterally.  She states she is having pain trochanteric region left greater than right today.  She does report that the last cortisone injection really did not raise her glucose levels much.  She reports that the injection was helpful.  She is having some low back pain.  She has also had swelling in her lower extremities.  And notes that she is having some neuropathy-like pain in her left leg. Review of Systems See HPI.  Objective: Vital Signs: Ht 5\' 2"  (1.575 m)   Wt 201 lb (91.2 kg)   BMI 36.76 kg/m   Physical Exam Neurological:     Mental Status: She is alert and oriented to person, place, and time.  Psychiatric:        Mood and Affect: Mood normal.     Ortho Exam Bilateral hips good range of motion without pain.  Tenderness over the left trochanteric region. Specialty Comments:  No specialty comments available.  Imaging: No results found.   PMFS History: Patient Active Problem List   Diagnosis Date Noted  . History of total knee replacement, left 04/03/2019  . History of total knee arthroplasty, right 04/03/2019  . DERMATOFIBROMA 10/26/2007  . ANEMIA, CHRONIC 10/26/2007  . SICKLE CELL TRAIT 10/26/2007  . ESSENTIAL HYPERTENSION 10/26/2007  . GASTROESOPHAGEAL REFLUX DISEASE 10/26/2007  . GASTRITIS 10/26/2007  . HIATAL HERNIA 10/26/2007  .  CONSTIPATION, CHRONIC 10/26/2007  . GUAIAC POSITIVE STOOL 10/26/2007   Past Medical History:  Diagnosis Date  . Anemia    no current med.  . Arthritis    knees  . Asthma    no current meds.  . Cataract of both eyes    early  . Diabetes mellitus    only taking natural supplements  . GERD (gastroesophageal reflux disease)    no current med.  . Hypertension    has been on med. x 10 yrs.  . IBS (irritable bowel syndrome)   . Post-nasal drip    current cough  . Sickle cell trait ([place])   . Urinary urgency     Family History   Problem Relation Age of Onset  . Heart disease Mother   . Diabetes Other   . Hypertension Other   . Breast cancer Sister 13  . Colon cancer Neg Hx   . Stomach cancer Neg Hx     Past Surgical History:  Procedure Laterality Date  . ABDOMINAL HYSTERECTOMY    . BUNIONECTOMY  08/25/2011   Procedure: [name];  Surgeon: Hessie Dibble, MD;  Location: [place];  Service: Orthopedics;  Laterality: Right;  . CATARACT EXTRACTION     both eyes  . COLONOSCOPY W/ BIOPSIES AND POLYPECTOMY    . DILATION AND CURETTAGE OF UTERUS    . INJECTION KNEE  08/25/2011   Procedure: KNEE INJECTION;  Surgeon: Hessie Dibble, MD;  Location: [place];  Service: Orthopedics;  Laterality: Bilateral;  . JOINT REPLACEMENT    . KNEE ARTHROSCOPY  12/11/2008   bilat., with chondroplasty  . KNEE JOINT MANIPULATION  10/30/2009   bilat.  . OPEN REDUCTION INTERNAL FIXATION (ORIF) DISTAL RADIAL FRACTURE Left 11/17/2014   Procedure: OPEN REDUCTION INTERNAL FIXATION (ORIF) LEFT DISTAL RADIUS  FRACTURE;  Surgeon: Charlotte Crumb, MD;  Location: [place];  Service: Orthopedics;  Laterality: Left;  . TOTAL KNEE ARTHROPLASTY  09/26/2009   bilat.   Social History   Occupational History  . Not on file  Tobacco Use  . Smoking status: Former Smoker    Types: Cigarettes    Quit date: 05/05/1985    Years since quitting: 34.9  . Smokeless tobacco: Never Used  Substance and Sexual Activity  . Alcohol use: Yes    Alcohol/week: 4.0 standard drinks    Types: 4 Glasses of wine per week    Comment: wkends.  . Drug use: No  . Sexual activity: Not on file

## 2020-04-23 ENCOUNTER — Other Ambulatory Visit: Payer: Self-pay | Admitting: Radiology

## 2020-04-23 DIAGNOSIS — M545 Low back pain, unspecified: Secondary | ICD-10-CM

## 2020-04-23 DIAGNOSIS — G8929 Other chronic pain: Secondary | ICD-10-CM

## 2020-05-13 ENCOUNTER — Ambulatory Visit: Payer: BC Managed Care – PPO | Admitting: Physical Therapy

## 2020-05-23 ENCOUNTER — Ambulatory Visit: Payer: BC Managed Care – PPO

## 2020-05-24 ENCOUNTER — Encounter: Payer: Self-pay | Admitting: Physical Therapy

## 2020-05-24 ENCOUNTER — Ambulatory Visit: Payer: BC Managed Care – PPO | Attending: Physician Assistant | Admitting: Physical Therapy

## 2020-05-24 ENCOUNTER — Other Ambulatory Visit: Payer: Self-pay

## 2020-05-24 DIAGNOSIS — G8929 Other chronic pain: Secondary | ICD-10-CM | POA: Insufficient documentation

## 2020-05-24 DIAGNOSIS — M25551 Pain in right hip: Secondary | ICD-10-CM | POA: Diagnosis present

## 2020-05-24 DIAGNOSIS — M545 Low back pain, unspecified: Secondary | ICD-10-CM

## 2020-05-24 DIAGNOSIS — M25552 Pain in left hip: Secondary | ICD-10-CM | POA: Insufficient documentation

## 2020-05-24 DIAGNOSIS — M6281 Muscle weakness (generalized): Secondary | ICD-10-CM | POA: Insufficient documentation

## 2020-05-24 NOTE — Therapy (Signed)
[place], [place], [address] Phone: [phone]   Fax:  [phone]  Physical Therapy Treatment/ Re-evaluation Progress Note Reporting Period 03/13/2020 to 05/24/2020  See note below for Objective Data and Assessment of Progress/Goals.       Patient Details  Name: Taylor Edwards MRN: [id number] Date of Birth: 05-30-1941 Referring Provider (PT): Jean Rosenthal, MD   Encounter Date: 05/24/2020   PT End of Session - 05/24/20 1214    Visit Number 4    Number of Visits 12    Date for PT Re-Evaluation 06/21/20    Authorization Type BCBS/UHC Medicare, progress note by visit 60, recheck FOTO by visit 6    Progress Note Due on Visit 14    PT Start Time 1145    PT Stop Time 1225    PT Time Calculation (min) 40 min    Activity Tolerance Patient tolerated treatment well    Behavior During Therapy [facility] for tasks assessed/performed           Past Medical History:  Diagnosis Date  . Anemia    no current med.  . Arthritis    knees  . Asthma    no current meds.  . Cataract of both eyes    early  . Diabetes mellitus    only taking natural supplements  . GERD (gastroesophageal reflux disease)    no current med.  . Hypertension    has been on med. x 10 yrs.  . IBS (irritable bowel syndrome)   . Post-nasal drip    current cough  . Sickle cell trait ([place])   . Urinary urgency     Past Surgical History:  Procedure Laterality Date  . ABDOMINAL HYSTERECTOMY    . BUNIONECTOMY  08/25/2011   Procedure: [name];  Surgeon: Hessie Dibble, MD;  Location: [place];  Service: Orthopedics;  Laterality: Right;  . CATARACT EXTRACTION     both eyes  . COLONOSCOPY W/ BIOPSIES AND POLYPECTOMY    . DILATION AND CURETTAGE OF UTERUS    . INJECTION KNEE  08/25/2011   Procedure: KNEE INJECTION;  Surgeon: Hessie Dibble, MD;  Location: [place];  Service: Orthopedics;   Laterality: Bilateral;  . JOINT REPLACEMENT    . KNEE ARTHROSCOPY  12/11/2008   bilat., with chondroplasty  . KNEE JOINT MANIPULATION  10/30/2009   bilat.  . OPEN REDUCTION INTERNAL FIXATION (ORIF) DISTAL RADIAL FRACTURE Left 11/17/2014   Procedure: OPEN REDUCTION INTERNAL FIXATION (ORIF) LEFT DISTAL RADIUS  FRACTURE;  Surgeon: Charlotte Crumb, MD;  Location: [place];  Service: Orthopedics;  Laterality: Left;  . TOTAL KNEE ARTHROPLASTY  09/26/2009   bilat.    There were no vitals filed for this visit.   Subjective Assessment - 05/24/20 1150    Subjective pt brought a new referral to include her back to her POC. She reports the pain has been going on for years with no specific onset. pt reports the pain stays in the middle of the low back and denies any referred symptoms. she repots hx of incontinence which has been going on fro 6 months and she seeing her MD for, but denies saddle paresthesia.    Pertinent History Bilateral TKA, diabetic, HTN, IBS, sickle cell trait    How long can you sit comfortably? 30-60 min    How long can you stand comfortably? 30 min    How long can you walk comfortably? 30 min  Diagnostic tests nothing recent for the back    Patient Stated Goals Get hips better, decrease back pain. improve standing/ walkin g    Currently in Pain? Yes    Pain Score 4     Pain Location Hip    Pain Orientation Right;Left    Pain Descriptors / Indicators Aching;Sore    Pain Type Chronic pain    Pain Onset More than a month ago    Pain Frequency Intermittent    Aggravating Factors  walking/ standing for longer periods    Pain Relieving Factors resting    Effect of Pain on Daily Activities limited standing and walking    Multiple Pain Sites Yes    Pain Score 2   at worst 8-9/10   Pain Location Back    Pain Orientation Mid;Lower    Pain Descriptors / Indicators Aching    Pain Type Chronic pain    Pain Onset More than a month ago    Pain Frequency Intermittent    Aggravating  Factors  standing/ walking for long periods    Pain Relieving Factors medication, topical ointment, heat,    Effect of Pain on Daily Activities lmited standing/ walking              [facility] PT Assessment - 05/24/20 0001      Assessment   Medical Diagnosis Bilateral trochanteric bursitis, Chronic midline low back pain, unspecified whether sciatica present     Referring Provider (PT) Jean Rosenthal, MD    Onset Date/Surgical Date --   many years   Hand Dominance Right    Next MD Visit PRN      Precautions   Precautions None      Restrictions   Weight Bearing Restrictions No      Balance Screen   Has the patient fallen in the past 6 months No    Has the patient had a decrease in activity level because of a fear of falling?  No    Is the patient reluctant to leave their home because of a fear of falling?  No      AROM   AROM Assessment Site Lumbar    Lumbar Flexion 90    Lumbar Extension 25    Lumbar - Right Side Bend 20    Lumbar - Left Side Bend 20      Strength   Right Hip Flexion 4+/5    Right Hip Extension 4/5    Right Hip External Rotation  4+/5    Right Hip Internal Rotation 5/5    Right Hip ABduction 4-/5    Right Hip ADduction 4+/5    Left Hip Flexion 5/5    Left Hip Extension 4/5    Left Hip External Rotation 5/5    Left Hip Internal Rotation 5/5    Left Hip ABduction 4/5    Left Hip ADduction 4+/5    Right Knee Flexion 5/5    Right Knee Extension 5/5    Left Knee Flexion 5/5    Left Knee Extension 5/5      Palpation   Palpation comment TTP along bil glute med, and greater trochanter. for the back                          [facility] Adult PT Treatment/Exercise - 05/24/20 0001      Lumbar Exercises: Stretches   Lower Trunk Rotation Limitations 2  x 20      Lumbar  Exercises: Supine   Ab Set 10 reps;5 seconds    Pelvic Tilt 10 reps;5 seconds   PPT   Pelvic Tilt Limitations cues to count out loud to promote breathing    Bent Knee  Raise 10 reps   keeping core tight throughout exercise                 PT Education - 05/24/20 1219    Education Details Reviewed previously provided HEp and updated today for the back. Reviewed updated POC, goals.    Person(s) Educated Patient    Methods Explanation;Verbal cues;Handout    Comprehension Verbalized understanding;Verbal cues required               PT Long Term Goals - 05/24/20 1226      PT LONG TERM GOAL #1   Title Independent and compliant with HEP performance    Time 4    Period Weeks    Status On-going    Target Date 06/21/20      PT LONG TERM GOAL #2   Title Improve FOTO outcome measure score to 45% or less impairment    Time 4    Period Weeks    Status Unable to assess    Target Date 06/21/20      PT LONG TERM GOAL #3   Title Increase hip abduction strength at least 1/2 MMT grade bilat. to improve gait mechanics to decrease pain with walking    Time 4    Period Weeks    Status On-going    Target Date 06/21/20      PT LONG TERM GOAL #4   Title Tolerate standing and walking periods at least 20-30 min for community ambulation and activities such as cooking with hip pain 3/10 or less bilat.    Time 4    Period Weeks    Status On-going    Target Date 06/21/20      PT LONG TERM GOAL #5   Title Ascend/descend stairs to second story of home with reciprocal gait    Time 4    Period Weeks    Status On-going    Target Date 06/21/20      Additional Long Term Goals   Additional Long Term Goals Yes      PT LONG TERM GOAL #6   Title report decreased low back pain to </= 2/10 to promote overall function and QOL    Time 4    Period Weeks    Status New    Target Date 06/21/20                 Plan - 05/24/20 1221    Clinical Impression Statement pt returns to PT since her last attended visit on 04/19/2020 due to traveling to [facility]. She presents to OPPT with new script to include her low back to her POC. She demonstrates funcitonal  trunk mobility with report of end range soreness with all motions. she continues demo general weakness in the hips. TTP along bil lumbar paraspinals. she would benefit from physical therapy to decrease bil hip / ow back pain, improve hip/ knee and core strength and maximize overall function by addressing the deficits listed.    Rehab Potential Good    PT Frequency 2x / week    PT Duration 4 weeks    PT Treatment/Interventions Cryotherapy;ADLs/Self Care Home Management;Ultrasound;Electrical Stimulation;Iontophoresis 4mg /ml Dexamethasone;Moist Heat;Therapeutic activities;Functional mobility training;Stair training;Gait training;Therapeutic exercise;Patient/family education;Neuromuscular re-education;Dry needling;Manual techniques;Taping    PT Next Visit  Plan , work on hip strengthening with closed and open chain activities pending pain, glut + IT band and piriformis stretches res, ionto for hips PRN, core strengtheing, STW for bil glute med/ lumbar paraspinals. stair training, supine <> sit transitions.    PT Home Exercise Plan Access Code: [facility]    Consulted and Agree with Plan of Care Patient           Patient will benefit from skilled therapeutic intervention in order to improve the following deficits and impairments:  Pain, Impaired flexibility, Decreased strength, Decreased activity tolerance, Decreased range of motion, Abnormal gait, Difficulty walking  Visit Diagnosis: Pain in right hip  Pain in left hip  Muscle weakness (generalized)  Chronic bilateral low back pain, unspecified whether sciatica present     Problem List Patient Active Problem List   Diagnosis Date Noted  . History of total knee replacement, left 04/03/2019  . History of total knee arthroplasty, right 04/03/2019  . DERMATOFIBROMA 10/26/2007  . ANEMIA, CHRONIC 10/26/2007  . SICKLE CELL TRAIT 10/26/2007  . ESSENTIAL HYPERTENSION 10/26/2007  . GASTROESOPHAGEAL REFLUX DISEASE 10/26/2007  . GASTRITIS  10/26/2007  . HIATAL HERNIA 10/26/2007  . CONSTIPATION, CHRONIC 10/26/2007  . GUAIAC POSITIVE STOOL 10/26/2007    Starr Lake PT, DPT, LAT, ATC  05/24/20  12:31 PM      [facility] [address] [place], [place], [address] Phone: [phone]   Fax:  [phone]  Name: Taylor Edwards MRN: [id number] Date of Birth: [birth year]

## 2020-06-12 ENCOUNTER — Other Ambulatory Visit: Payer: Self-pay

## 2020-06-12 ENCOUNTER — Encounter: Payer: Self-pay | Admitting: Physical Therapy

## 2020-06-12 ENCOUNTER — Ambulatory Visit: Payer: BC Managed Care – PPO | Admitting: Physical Therapy

## 2020-06-12 DIAGNOSIS — G8929 Other chronic pain: Secondary | ICD-10-CM

## 2020-06-12 DIAGNOSIS — M545 Low back pain, unspecified: Secondary | ICD-10-CM

## 2020-06-12 DIAGNOSIS — M25552 Pain in left hip: Secondary | ICD-10-CM

## 2020-06-12 DIAGNOSIS — M25551 Pain in right hip: Secondary | ICD-10-CM

## 2020-06-12 DIAGNOSIS — M6281 Muscle weakness (generalized): Secondary | ICD-10-CM

## 2020-06-12 NOTE — Therapy (Signed)
[place], [place], [address] Phone: [phone]   Fax:  [phone]  Physical Therapy Treatment  Patient Details  Name: Taylor Edwards MRN: [id number] Date of Birth: 05-19-1941 Referring Provider (PT): Jean Rosenthal, MD   Encounter Date: 06/12/2020   PT End of Session - 06/12/20 2042    Visit Number 5    Number of Visits 12    Date for PT Re-Evaluation 06/21/20    Authorization Type BCBS/UHC Medicare, progress note by visit 17, recheck FOTO by visit 6    PT Start Time 1847    PT Stop Time 1927    PT Time Calculation (min) 40 min    Activity Tolerance Patient tolerated treatment well    Behavior During Therapy [facility] for tasks assessed/performed           Past Medical History:  Diagnosis Date  . Anemia    no current med.  . Arthritis    knees  . Asthma    no current meds.  . Cataract of both eyes    early  . Diabetes mellitus    only taking natural supplements  . GERD (gastroesophageal reflux disease)    no current med.  . Hypertension    has been on med. x 10 yrs.  . IBS (irritable bowel syndrome)   . Post-nasal drip    current cough  . Sickle cell trait ([place])   . Urinary urgency     Past Surgical History:  Procedure Laterality Date  . ABDOMINAL HYSTERECTOMY    . BUNIONECTOMY  08/25/2011   Procedure: [name];  Surgeon: Hessie Dibble, MD;  Location: [place];  Service: Orthopedics;  Laterality: Right;  . CATARACT EXTRACTION     both eyes  . COLONOSCOPY W/ BIOPSIES AND POLYPECTOMY    . DILATION AND CURETTAGE OF UTERUS    . INJECTION KNEE  08/25/2011   Procedure: KNEE INJECTION;  Surgeon: Hessie Dibble, MD;  Location: [place];  Service: Orthopedics;  Laterality: Bilateral;  . JOINT REPLACEMENT    . KNEE ARTHROSCOPY  12/11/2008   bilat., with chondroplasty  . KNEE JOINT MANIPULATION  10/30/2009   bilat.  . OPEN REDUCTION  INTERNAL FIXATION (ORIF) DISTAL RADIAL FRACTURE Left 11/17/2014   Procedure: OPEN REDUCTION INTERNAL FIXATION (ORIF) LEFT DISTAL RADIUS  FRACTURE;  Surgeon: Charlotte Crumb, MD;  Location: [place];  Service: Orthopedics;  Laterality: Left;  . TOTAL KNEE ARTHROPLASTY  09/26/2009   bilat.    There were no vitals filed for this visit.   Subjective Assessment - 06/12/20 1851    Subjective Pt. saw Dr. Felipa Eth regarding ankle swelling and she reports he did not think etiology was cardiac/CHF related and was related to issues with her veins. She reports saw practitioner (chiropractor) regarding "nerves"/neuropathy but decided not to go through with treatment. She is interested in following up wth MD vascular specialist so urged her to check with PCP regarding referral for this if appropriate.    Pertinent History Bilateral TKA, diabetic, HTN, IBS, sickle cell trait    Currently in Pain? Yes    Pain Score 6     Pain Location Back    Pain Orientation Lower    Pain Descriptors / Indicators Sharp    Pain Type Chronic pain    Pain Onset More than a month ago    Pain Frequency Intermittent    Aggravating Factors  worse in AM, sitting prolonged periods, standing/walking  Pain Relieving Factors heat    Effect of Pain on Daily Activities limits positional and activity tolerance                             [facility] Adult PT Treatment/Exercise - 06/12/20 0001      Lumbar Exercises: Stretches   Single Knee to Chest Stretch Right;Left;3 reps;20 seconds    Double Knee to Chest Stretch Limitations DKTC x 15 reps with legs on 55 cm P-ball    Piriformis Stretch Right;Left;2 reps;30 seconds    Other Lumbar Stretch Exercise glut stretch 2x30 sec ea. bilat.      Lumbar Exercises: Seated   Other Seated Lumbar Exercises brief HEP review seated TA contraction using hands just medial to ASIS to palpate contraction      Lumbar Exercises: Supine   Pelvic Tilt 15 reps    Pelvic Tilt Limitations  verbal cues for form     Bent Knee Raise 15 reps    Bridge 15 reps    Bridge Limitations legs on reversed incline wedge    Other Supine Lumbar Exercises hip adduction isometric 5 sec x 15 reps      Manual Therapy   Manual Therapy Joint mobilization    Joint Mobilization LAD bilat. hips grade I-III oscillations                  PT Education - 06/12/20 2042    Education Details HEP review, spinal anatomy/symptom etiology    Person(s) Educated Patient    Methods Explanation;Verbal cues;Demonstration;Handout    Comprehension Verbalized understanding               PT Long Term Goals - 05/24/20 1226      PT LONG TERM GOAL #1   Title Independent and compliant with HEP performance    Time 4    Period Weeks    Status On-going    Target Date 06/21/20      PT LONG TERM GOAL #2   Title Improve FOTO outcome measure score to 45% or less impairment    Time 4    Period Weeks    Status Unable to assess    Target Date 06/21/20      PT LONG TERM GOAL #3   Title Increase hip abduction strength at least 1/2 MMT grade bilat. to improve gait mechanics to decrease pain with walking    Time 4    Period Weeks    Status On-going    Target Date 06/21/20      PT LONG TERM GOAL #4   Title Tolerate standing and walking periods at least 20-30 min for community ambulation and activities such as cooking with hip pain 3/10 or less bilat.    Time 4    Period Weeks    Status On-going    Target Date 06/21/20      PT LONG TERM GOAL #5   Title Ascend/descend stairs to second story of home with reciprocal gait    Time 4    Period Weeks    Status On-going    Target Date 06/21/20      Additional Long Term Goals   Additional Long Term Goals Yes      PT LONG TERM GOAL #6   Title report decreased low back pain to </= 2/10 to promote overall function and QOL    Time 4    Period Weeks    Status New  Target Date 06/21/20                 Plan - 06/12/20 1900    Clinical  Impression Statement Reviewed HEP and focused tx. on flexion bias core and lumbar stabilization given underlying facet arthopathy. Tx. was well-tolerated excepting some "rebound" soreness in back with hip distraction mobility added a form of lumbar manual traction. Expect progress will be gradual given factors including pt. age, symptom duration and comorbidities.    Personal Factors and Comorbidities Time since onset of injury/illness/exacerbation;Comorbidity 3+;Past/Current Experience    Comorbidities diabetic with neuropathy, sickle cell trait, surgical history for knees    Examination-Activity Limitations Stand;Locomotion Level;Stairs    Examination-Participation Restrictions Shop;Community Activity;Cleaning;Meal Prep    Stability/Clinical Decision Making Evolving/Moderate complexity    Clinical Decision Making Moderate    Rehab Potential Good    PT Frequency 2x / week    PT Duration 4 weeks    PT Treatment/Interventions Cryotherapy;ADLs/Self Care Home Management;Ultrasound;Electrical Stimulation;Iontophoresis 4mg /ml Dexamethasone;Moist Heat;Therapeutic activities;Functional mobility training;Stair training;Gait training;Therapeutic exercise;Patient/family education;Neuromuscular re-education;Dry needling;Manual techniques;Taping    PT Next Visit Plan Continue flexion bias lumbar/core stabilization, hip strengthening and stretches, manual prn    PT Home Exercise Plan Access Code: [facility]    Consulted and Agree with Plan of Care Patient           Patient will benefit from skilled therapeutic intervention in order to improve the following deficits and impairments:  Pain, Impaired flexibility, Decreased strength, Decreased activity tolerance, Decreased range of motion, Abnormal gait, Difficulty walking  Visit Diagnosis: Pain in right hip  Pain in left hip  Muscle weakness (generalized)  Chronic bilateral low back pain, unspecified whether sciatica present     Problem List Patient  Active Problem List   Diagnosis Date Noted  . History of total knee replacement, left 04/03/2019  . History of total knee arthroplasty, right 04/03/2019  . DERMATOFIBROMA 10/26/2007  . ANEMIA, CHRONIC 10/26/2007  . SICKLE CELL TRAIT 10/26/2007  . ESSENTIAL HYPERTENSION 10/26/2007  . GASTROESOPHAGEAL REFLUX DISEASE 10/26/2007  . GASTRITIS 10/26/2007  . HIATAL HERNIA 10/26/2007  . CONSTIPATION, CHRONIC 10/26/2007  . GUAIAC POSITIVE STOOL 10/26/2007    Beaulah Dinning, PT, DPT 06/12/20 8:49 PM  [facility] [address] [place], [place], [address] Phone: [phone]   Fax:  [phone]  Name: Taylor Edwards MRN: [id number] Date of Birth: Jan 20, 1941

## 2020-06-13 ENCOUNTER — Ambulatory Visit: Payer: BC Managed Care – PPO | Admitting: Physical Therapy

## 2020-06-13 ENCOUNTER — Encounter: Payer: Self-pay | Admitting: Physical Therapy

## 2020-06-13 ENCOUNTER — Ambulatory Visit: Payer: BC Managed Care – PPO

## 2020-06-13 DIAGNOSIS — M6281 Muscle weakness (generalized): Secondary | ICD-10-CM

## 2020-06-13 DIAGNOSIS — M545 Low back pain, unspecified: Secondary | ICD-10-CM

## 2020-06-13 DIAGNOSIS — G8929 Other chronic pain: Secondary | ICD-10-CM

## 2020-06-13 DIAGNOSIS — M25552 Pain in left hip: Secondary | ICD-10-CM

## 2020-06-13 DIAGNOSIS — M25551 Pain in right hip: Secondary | ICD-10-CM

## 2020-06-13 NOTE — Therapy (Signed)
[place], [place], [address] Phone: [phone]   Fax:  [phone]  Physical Therapy Treatment  Patient Details  Name: Taylor Edwards MRN: [id number] Date of Birth: [birth year] Referring Provider (PT): Jean Rosenthal, MD   Encounter Date: 06/13/2020   PT End of Session - 06/13/20 1755    Visit Number 6    Number of Visits 12    Date for PT Re-Evaluation 06/21/20    Authorization Type BCBS/UHC Medicare, progress note by visit 62, recheck FOTO by visit 6    PT Start Time 1723    PT Stop Time 1801    PT Time Calculation (min) 38 min    Activity Tolerance Patient tolerated treatment well    Behavior During Therapy [facility] for tasks assessed/performed           Past Medical History:  Diagnosis Date  . Anemia    no current med.  . Arthritis    knees  . Asthma    no current meds.  . Cataract of both eyes    early  . Diabetes mellitus    only taking natural supplements  . GERD (gastroesophageal reflux disease)    no current med.  . Hypertension    has been on med. x 10 yrs.  . IBS (irritable bowel syndrome)   . Post-nasal drip    current cough  . Sickle cell trait ([place])   . Urinary urgency     Past Surgical History:  Procedure Laterality Date  . ABDOMINAL HYSTERECTOMY    . BUNIONECTOMY  08/25/2011   Procedure: [name];  Surgeon: Hessie Dibble, MD;  Location: [place];  Service: Orthopedics;  Laterality: Right;  . CATARACT EXTRACTION     both eyes  . COLONOSCOPY W/ BIOPSIES AND POLYPECTOMY    . DILATION AND CURETTAGE OF UTERUS    . INJECTION KNEE  08/25/2011   Procedure: KNEE INJECTION;  Surgeon: Hessie Dibble, MD;  Location: [place];  Service: Orthopedics;  Laterality: Bilateral;  . JOINT REPLACEMENT    . KNEE ARTHROSCOPY  12/11/2008   bilat., with chondroplasty  . KNEE JOINT MANIPULATION  10/30/2009   bilat.  . OPEN REDUCTION  INTERNAL FIXATION (ORIF) DISTAL RADIAL FRACTURE Left 11/17/2014   Procedure: OPEN REDUCTION INTERNAL FIXATION (ORIF) LEFT DISTAL RADIUS  FRACTURE;  Surgeon: Charlotte Crumb, MD;  Location: [place];  Service: Orthopedics;  Laterality: Left;  . TOTAL KNEE ARTHROPLASTY  09/26/2009   bilat.    There were no vitals filed for this visit.   Subjective Assessment - 06/13/20 1732    Subjective No major soreness after yesterday's visit. Pt. reports swelling in her ankles a little better today.                             [facility] Adult PT Treatment/Exercise - 06/13/20 0001      Exercises   Exercises Lumbar      Lumbar Exercises: Stretches   Passive Hamstring Stretch Right;Left;2 reps;30 seconds    Double Knee to Chest Stretch Limitations DKTC x 15 reps with legs on 55 cm P-ball    Lower Trunk Rotation Limitations x 10 reps ea. bilat.    Other Lumbar Stretch Exercise glut stretch 2x30 sec ea. bilat.      Lumbar Exercises: Standing   Functional Squats 20 reps    Functional Squats Limitations TRX partial squat 2x10  Shoulder Extension AROM;Strengthening;Both;20 reps    Theraband Level (Shoulder Extension) Level 3 (Green)    Other Standing Lumbar Exercises Pall off press doubled green band x 15 ea. way bilat.      Lumbar Exercises: Supine   Pelvic Tilt 15 reps    Bent Knee Raise 15 reps    Bridge with Ball Squeeze 15 reps    Other Supine Lumbar Exercises alternating UE raises with posterior pelvic tilt x 15 reps holding 2 lb. DB in each UE      Knee/Hip Exercises: Standing   Hip Abduction AROM;Stengthening;Both;2 sets;10 reps;Knee straight    Abduction Limitations 3 lb. ankle weights bilat.                  PT Education - 06/13/20 1755    Education Details exercises    Person(s) Educated Patient    Methods Explanation    Comprehension Verbalized understanding               PT Long Term Goals - 05/24/20 1226      PT LONG TERM GOAL #1   Title  Independent and compliant with HEP performance    Time 4    Period Weeks    Status On-going    Target Date 06/21/20      PT LONG TERM GOAL #2   Title Improve FOTO outcome measure score to 45% or less impairment    Time 4    Period Weeks    Status Unable to assess    Target Date 06/21/20      PT LONG TERM GOAL #3   Title Increase hip abduction strength at least 1/2 MMT grade bilat. to improve gait mechanics to decrease pain with walking    Time 4    Period Weeks    Status On-going    Target Date 06/21/20      PT LONG TERM GOAL #4   Title Tolerate standing and walking periods at least 20-30 min for community ambulation and activities such as cooking with hip pain 3/10 or less bilat.    Time 4    Period Weeks    Status On-going    Target Date 06/21/20      PT LONG TERM GOAL #5   Title Ascend/descend stairs to second story of home with reciprocal gait    Time 4    Period Weeks    Status On-going    Target Date 06/21/20      Additional Long Term Goals   Additional Long Term Goals Yes      PT LONG TERM GOAL #6   Title report decreased low back pain to </= 2/10 to promote overall function and QOL    Time 4    Period Weeks    Status New    Target Date 06/21/20                 Plan - 06/13/20 1755    Clinical Impression Statement Continued work on core and lumbar strengthening with inclusion of standing exercises today along with mat-based strengthening and stretches. Some general soreness at times with exercises but no overt pain exacerbation with tx. As previously expect potential progress will be gradual given symptom chronicity and etiology so progress with therapy goals ongoing.    Comorbidities diabetic with neuropathy, sickle cell trait, surgical history for knees    Examination-Activity Limitations Stand;Locomotion Level;Stairs    Examination-Participation Restrictions Shop;Community Activity;Cleaning;Meal Prep    Stability/Clinical Decision Making  Evolving/Moderate  complexity    Clinical Decision Making Moderate    Rehab Potential Good    PT Frequency 2x / week    PT Duration 4 weeks    PT Treatment/Interventions Cryotherapy;ADLs/Self Care Home Management;Ultrasound;Electrical Stimulation;Iontophoresis 4mg /ml Dexamethasone;Moist Heat;Therapeutic activities;Functional mobility training;Stair training;Gait training;Therapeutic exercise;Patient/family education;Neuromuscular re-education;Dry needling;Manual techniques;Taping    PT Next Visit Plan Continue flexion bias lumbar/core stabilization, hip strengthening and stretches, manual prn    PT Home Exercise Plan Access Code: [facility]    Consulted and Agree with Plan of Care Patient           Patient will benefit from skilled therapeutic intervention in order to improve the following deficits and impairments:  Pain, Impaired flexibility, Decreased strength, Decreased activity tolerance, Decreased range of motion, Abnormal gait, Difficulty walking  Visit Diagnosis: Pain in right hip  Pain in left hip  Muscle weakness (generalized)  Chronic bilateral low back pain, unspecified whether sciatica present     Problem List Patient Active Problem List   Diagnosis Date Noted  . History of total knee replacement, left 04/03/2019  . History of total knee arthroplasty, right 04/03/2019  . DERMATOFIBROMA 10/26/2007  . ANEMIA, CHRONIC 10/26/2007  . SICKLE CELL TRAIT 10/26/2007  . ESSENTIAL HYPERTENSION 10/26/2007  . GASTROESOPHAGEAL REFLUX DISEASE 10/26/2007  . GASTRITIS 10/26/2007  . HIATAL HERNIA 10/26/2007  . CONSTIPATION, CHRONIC 10/26/2007  . GUAIAC POSITIVE STOOL 10/26/2007    Beaulah Dinning, PT, DPT 06/13/20 6:02 PM  [facility] [address] [place], [place], [address] Phone: [phone]   Fax:  [phone]  Name: Taylor Edwards MRN: [id number] Date of Birth: 04-Dec-1940

## 2020-06-20 ENCOUNTER — Encounter: Payer: Self-pay | Admitting: Physical Therapy

## 2020-06-20 ENCOUNTER — Ambulatory Visit: Payer: BC Managed Care – PPO | Admitting: Physical Therapy

## 2020-06-20 ENCOUNTER — Other Ambulatory Visit: Payer: Self-pay

## 2020-06-20 DIAGNOSIS — M25552 Pain in left hip: Secondary | ICD-10-CM

## 2020-06-20 DIAGNOSIS — M6281 Muscle weakness (generalized): Secondary | ICD-10-CM

## 2020-06-20 DIAGNOSIS — G8929 Other chronic pain: Secondary | ICD-10-CM

## 2020-06-20 DIAGNOSIS — M25551 Pain in right hip: Secondary | ICD-10-CM | POA: Diagnosis not present

## 2020-06-20 DIAGNOSIS — M545 Low back pain, unspecified: Secondary | ICD-10-CM

## 2020-06-20 NOTE — Therapy (Signed)
[address] [place], [place], [address] Phone: [phone]   Fax:  [phone]  Physical Therapy Treatment  Patient Details  Name: Taylor Edwards MRN: [id number] Date of Birth: 1941-05-21 Referring Provider (PT): Jean Rosenthal, MD   Encounter Date: 06/20/2020   PT End of Session - 06/20/20 2013    Visit Number 7    Number of Visits 12    Date for PT Re-Evaluation 06/21/20    Authorization Type BCBS/UHC Medicare, progress note by visit 10, recheck FOTO by visit 6    PT Start Time 1722    PT Stop Time 1802    PT Time Calculation (min) 40 min    Activity Tolerance Patient tolerated treatment well    Behavior During Therapy [facility] for tasks assessed/performed           Past Medical History:  Diagnosis Date  . Anemia    no current med.  . Arthritis    knees  . Asthma    no current meds.  . Cataract of both eyes    early  . Diabetes mellitus    only taking natural supplements  . GERD (gastroesophageal reflux disease)    no current med.  . Hypertension    has been on med. x 10 yrs.  . IBS (irritable bowel syndrome)   . Post-nasal drip    current cough  . Sickle cell trait ([place])   . Urinary urgency     Past Surgical History:  Procedure Laterality Date  . ABDOMINAL HYSTERECTOMY    . BUNIONECTOMY  08/25/2011   Procedure: [name];  Surgeon: Hessie Dibble, MD;  Location: [place];  Service: Orthopedics;  Laterality: Right;  . CATARACT EXTRACTION     both eyes  . COLONOSCOPY W/ BIOPSIES AND POLYPECTOMY    . DILATION AND CURETTAGE OF UTERUS    . INJECTION KNEE  08/25/2011   Procedure: KNEE INJECTION;  Surgeon: Hessie Dibble, MD;  Location: [place];  Service: Orthopedics;  Laterality: Bilateral;  . JOINT REPLACEMENT    . KNEE ARTHROSCOPY  12/11/2008   bilat., with chondroplasty  . KNEE JOINT MANIPULATION  10/30/2009   bilat.  . OPEN REDUCTION  INTERNAL FIXATION (ORIF) DISTAL RADIAL FRACTURE Left 11/17/2014   Procedure: OPEN REDUCTION INTERNAL FIXATION (ORIF) LEFT DISTAL RADIUS  FRACTURE;  Surgeon: Charlotte Crumb, MD;  Location: [facility];  Service: Orthopedics;  Laterality: Left;  . TOTAL KNEE ARTHROPLASTY  09/26/2009   bilat.    There were no vitals filed for this visit.   Subjective Assessment - 06/20/20 1724    Subjective "Everything is a little better (for back and hip pain)". Pt. reports was found to have low thyroid which could be contributing to her feeling "sluggish"-she reports was prescribed medication (levothyroxine?) but has not been able to pick up yet so plan update chart once medication and dose can be clarified.    Pertinent History Bilateral TKA, diabetic, HTN, IBS, sickle cell trait    Currently in Pain? Yes    Pain Score 3     Pain Location Back    Pain Orientation Lower    Pain Descriptors / Indicators Sharp    Pain Type Chronic pain    Pain Onset More than a month ago    Pain Frequency Intermittent    Aggravating Factors  worse later in the day    Pain Relieving Factors heat  [facility] Adult PT Treatment/Exercise - 06/20/20 0001      Lumbar Exercises: Stretches   Passive Hamstring Stretch Right;Left;3 reps;30 seconds    Double Knee to Chest Stretch Limitations DKTC x 15 reps with legs on 55 cm P-ball    Lower Trunk Rotation Limitations x 10 reps ea. bilat.   legs on 55 cm P-ball   Other Lumbar Stretch Exercise glut stretch 2x30 sec ea. bilat.      Lumbar Exercises: Aerobic   Nustep L5 x 5 min UE/LE      Lumbar Exercises: Standing   Functional Squats 20 reps    Functional Squats Limitations TRX partial squat 2x10    Shoulder Extension AROM;Strengthening;Both;20 reps    Theraband Level (Shoulder Extension) Level 3 (Green)    Other Standing Lumbar Exercises Pall off press doubled green band x 15 ea. way bilat.      Lumbar Exercises: Seated   Other Seated  Lumbar Exercises seated abdominal bracing with shoulder extension isometric using both hands on 85 cm P-ball with ball on stool 3-5 sec x 15 reps      Lumbar Exercises: Supine   Clam 20 reps    Clam Limitations alternating unilat.    Bent Knee Raise 15 reps    Bent Knee Raise Limitations 2 lb. ankle weights bilat.    Bridge with [employer] 15 reps    Other Supine Lumbar Exercises alternating UE raises with posterior pelvic tilt x 15 reps holding 2 lb. DB in each UE                       PT Long Term Goals - 05/24/20 1226      PT LONG TERM GOAL #1   Title Independent and compliant with HEP performance    Time 4    Period Weeks    Status On-going    Target Date 06/21/20      PT LONG TERM GOAL #2   Title Improve FOTO outcome measure score to 45% or less impairment    Time 4    Period Weeks    Status Unable to assess    Target Date 06/21/20      PT LONG TERM GOAL #3   Title Increase hip abduction strength at least 1/2 MMT grade bilat. to improve gait mechanics to decrease pain with walking    Time 4    Period Weeks    Status On-going    Target Date 06/21/20      PT LONG TERM GOAL #4   Title Tolerate standing and walking periods at least 20-30 min for community ambulation and activities such as cooking with hip pain 3/10 or less bilat.    Time 4    Period Weeks    Status On-going    Target Date 06/21/20      PT LONG TERM GOAL #5   Title Ascend/descend stairs to second story of home with reciprocal gait    Time 4    Period Weeks    Status On-going    Target Date 06/21/20      Additional Long Term Goals   Additional Long Term Goals Yes      PT LONG TERM GOAL #6   Title report decreased low back pain to </= 2/10 to promote overall function and QOL    Time 4    Period Weeks    Status New    Target Date 06/21/20  Plan - 06/20/20 2013    Clinical Impression Statement Mild improvent for both hip and back pain. Some intermittent  soreness with exercises but overall responding well to flexion bias core/back strengthening and hip strengthening exercises.    Personal Factors and Comorbidities Time since onset of injury/illness/exacerbation;Comorbidity 3+;Past/Current Experience    Comorbidities diabetic with neuropathy, sickle cell trait, surgical history for knees    Examination-Activity Limitations Stand;Locomotion Level;Stairs    Examination-Participation Restrictions Shop;Community Activity;Cleaning;Meal Prep    Stability/Clinical Decision Making Evolving/Moderate complexity    Clinical Decision Making Moderate    Rehab Potential Good    PT Frequency 2x / week    PT Duration 4 weeks    PT Treatment/Interventions Cryotherapy;ADLs/Self Care Home Management;Ultrasound;Electrical Stimulation;Iontophoresis 4mg /ml Dexamethasone;Moist Heat;Therapeutic activities;Functional mobility training;Stair training;Gait training;Therapeutic exercise;Patient/family education;Neuromuscular re-education;Dry needling;Manual techniques;Taping    PT Next Visit Plan Recertification next session, continue flexion bias lumbar/core stabilization, hip strengthening and stretches, manual prn    PT Home Exercise Plan Access Code: [facility]    Consulted and Agree with Plan of Care Patient           Patient will benefit from skilled therapeutic intervention in order to improve the following deficits and impairments:  Pain, Impaired flexibility, Decreased strength, Decreased activity tolerance, Decreased range of motion, Abnormal gait, Difficulty walking  Visit Diagnosis: Pain in right hip  Pain in left hip  Muscle weakness (generalized)  Chronic bilateral low back pain, unspecified whether sciatica present     Problem List Patient Active Problem List   Diagnosis Date Noted  . History of total knee replacement, left 04/03/2019  . History of total knee arthroplasty, right 04/03/2019  . DERMATOFIBROMA 10/26/2007  . ANEMIA, CHRONIC  10/26/2007  . SICKLE CELL TRAIT 10/26/2007  . ESSENTIAL HYPERTENSION 10/26/2007  . GASTROESOPHAGEAL REFLUX DISEASE 10/26/2007  . GASTRITIS 10/26/2007  . HIATAL HERNIA 10/26/2007  . CONSTIPATION, CHRONIC 10/26/2007  . GUAIAC POSITIVE STOOL 10/26/2007    Beaulah Dinning, PT, DPT 06/20/20 8:16 PM  [facility] [address] [place], [place], [address] Phone: [phone]   Fax:  [phone]  Name: Taylor Edwards MRN: [id number] Date of Birth: 10/10/1940

## 2020-06-27 ENCOUNTER — Ambulatory Visit: Payer: BC Managed Care – PPO | Attending: Physician Assistant | Admitting: Physical Therapy

## 2020-06-27 ENCOUNTER — Encounter: Payer: Self-pay | Admitting: Physical Therapy

## 2020-06-27 ENCOUNTER — Other Ambulatory Visit: Payer: Self-pay

## 2020-06-27 DIAGNOSIS — M6281 Muscle weakness (generalized): Secondary | ICD-10-CM | POA: Diagnosis present

## 2020-06-27 DIAGNOSIS — M545 Low back pain, unspecified: Secondary | ICD-10-CM | POA: Diagnosis present

## 2020-06-27 DIAGNOSIS — M25552 Pain in left hip: Secondary | ICD-10-CM | POA: Insufficient documentation

## 2020-06-27 DIAGNOSIS — G8929 Other chronic pain: Secondary | ICD-10-CM | POA: Diagnosis present

## 2020-06-27 DIAGNOSIS — M25551 Pain in right hip: Secondary | ICD-10-CM | POA: Insufficient documentation

## 2020-06-27 NOTE — Therapy (Signed)
[place], [place], [address] Phone: [phone]   Fax:  [phone]  Physical Therapy Treatment/Recertification Progress Note Reporting Period 05/24/2020 to 06/27/2020  See note below for Objective Data and Assessment of Progress/Goals.       Patient Details  Name: Taylor Edwards MRN: [id number] Date of Birth: [birth year] Referring Provider (PT): Jean Rosenthal, MD   Encounter Date: 06/27/2020   PT End of Session - 06/27/20 1949    Visit Number 8    Number of Visits 18    Date for PT Re-Evaluation 08/08/20    Authorization Type BCBS/UHC Medicare, next progress note by visit 18    Progress Note Due on Visit 18    PT Start Time 1726    PT Stop Time 1807    PT Time Calculation (min) 41 min    Activity Tolerance Patient tolerated treatment well    Behavior During Therapy [facility] for tasks assessed/performed           Past Medical History:  Diagnosis Date  . Anemia    no current med.  . Arthritis    knees  . Asthma    no current meds.  . Cataract of both eyes    early  . Diabetes mellitus    only taking natural supplements  . GERD (gastroesophageal reflux disease)    no current med.  . Hypertension    has been on med. x 10 yrs.  . IBS (irritable bowel syndrome)   . Post-nasal drip    current cough  . Sickle cell trait ([place])   . Urinary urgency     Past Surgical History:  Procedure Laterality Date  . ABDOMINAL HYSTERECTOMY    . BUNIONECTOMY  08/25/2011   Procedure: [name];  Surgeon: Hessie Dibble, MD;  Location: [place];  Service: Orthopedics;  Laterality: Right;  . CATARACT EXTRACTION     both eyes  . COLONOSCOPY W/ BIOPSIES AND POLYPECTOMY    . DILATION AND CURETTAGE OF UTERUS    . INJECTION KNEE  08/25/2011   Procedure: KNEE INJECTION;  Surgeon: Hessie Dibble, MD;  Location: [place];  Service: Orthopedics;  Laterality:  Bilateral;  . JOINT REPLACEMENT    . KNEE ARTHROSCOPY  12/11/2008   bilat., with chondroplasty  . KNEE JOINT MANIPULATION  10/30/2009   bilat.  . OPEN REDUCTION INTERNAL FIXATION (ORIF) DISTAL RADIAL FRACTURE Left 11/17/2014   Procedure: OPEN REDUCTION INTERNAL FIXATION (ORIF) LEFT DISTAL RADIUS  FRACTURE;  Surgeon: Charlotte Crumb, MD;  Location: [place];  Service: Orthopedics;  Laterality: Left;  . TOTAL KNEE ARTHROPLASTY  09/26/2009   bilat.    There were no vitals filed for this visit.   Subjective Assessment - 06/27/20 1728    Subjective Pt. reports that her hips and low back are doing a little better but unfortunately she reports recent flare up of gout over the past 3 days affecting her left great toe so has had some increased difficulty walking due to this.    Pertinent History Bilateral TKA, diabetic, HTN, IBS, sickle cell trait    Limitations Standing;Walking    Currently in Pain? Yes    Pain Score 3     Pain Location Toe (Comment which one)   left great toe   Pain Orientation Left    Pain Descriptors / Indicators Pins and needles    Pain Type Acute pain    Pain Onset In the past 7  days    Pain Frequency Constant    Aggravating Factors  walking and standing    Pain Relieving Factors rest    Effect of Pain on Daily Activities limits standing and walking tolerance              [facility] PT Assessment - 06/27/20 0001      Observation/Other Assessments   Focus on Therapeutic Outcomes (FOTO)  43% limited      AROM   Right Hip Flexion 105    Right Hip External Rotation  42    Right Hip Internal Rotation  30    Right Hip ABduction 48    Left Hip Flexion 100    Left Hip External Rotation  45    Left Hip Internal Rotation  22    Left Hip ABduction 42    Lumbar Flexion 90    Lumbar Extension 25    Lumbar - Right Side Bend 25    Lumbar - Left Side Bend 28    Lumbar - Right Rotation WFL    Lumbar - Left Rotation [facility]      Strength   Right Hip Flexion 4+/5    Right Hip  Extension 4/5    Right Hip External Rotation  4+/5    Right Hip Internal Rotation 5/5    Right Hip ABduction 4/5    Right Hip ADduction 5/5    Left Hip Flexion 5/5    Left Hip Extension 4/5    Left Hip External Rotation 5/5    Left Hip Internal Rotation 5/5    Left Hip ABduction 4/5    Left Hip ADduction 5/5    Right Knee Flexion 5/5    Right Knee Extension 5/5    Left Knee Flexion 5/5    Left Knee Extension 5/5                         [facility] Adult PT Treatment/Exercise - 06/27/20 0001      Lumbar Exercises: Aerobic   Nustep L5 x 5 min UE/LE      Lumbar Exercises: Seated   Other Seated Lumbar Exercises seated abdominal bracing with shoulder extension isometric on 75 cm P-ball x 15 reps, seated clamshell blue band 2x10      Manual Therapy   Soft tissue mobilization IASTM/foam roll bilateral lateral hips, IT band                  PT Education - 06/27/20 1948    Education Details exercises, POC    Person(s) Educated Patient    Methods Explanation;Demonstration;Verbal cues    Comprehension Verbalized understanding;Returned demonstration               PT Long Term Goals - 06/27/20 1950      PT LONG TERM GOAL #1   Title Independent and compliant with HEP performance    Baseline met for previous HEP, will continue to update prn    Time 6    Period Weeks    Status Achieved    Target Date 08/08/20      PT LONG TERM GOAL #2   Title Improve FOTO outcome measure score to 45% or less impairment    Baseline 43% limited    Time 4    Period Weeks    Status Achieved      PT LONG TERM GOAL #3   Title Increase hip abduction strength at least 1/2 MMT grade bilat. to  improve gait mechanics to decrease pain with walking    Baseline 4/5 bilat.    Time 6    Period Weeks    Status On-going    Target Date 08/08/20      PT LONG TERM GOAL #4   Title Tolerate standing and walking periods at least 20-30 min for community ambulation and activities such as  cooking with hip and low back pain 3/10 or less bilat.    Baseline still ongoing    Time 6    Period Weeks    Status Revised    Target Date 08/08/20      PT LONG TERM GOAL #5   Title Ascend/descend stairs to second story of home with reciprocal gait    Baseline still uses single step pattern    Time 6    Period Weeks    Status On-going    Target Date 08/08/20                 Plan - 06/27/20 1953    Clinical Impression Statement Pt. has made mild improvement for both hip and lumbar pain (more recent addition lumbar region to plan of care with last re-eval). Focused treatment today for supine and seated exercises due to difficulty walking from recent gout flare up. Pt. has made functional improvements as evidenced by 19%  gain in FOTO score from baseline but continues with bilateral hip and core weakness. Plan continue PT for further progress to address remaining functional limitations for standing/walking and ability for stair navigation.    Comorbidities diabetic with neuropathy, sickle cell trait, surgical history for knees    Examination-Activity Limitations Stand;Locomotion Level;Stairs    Examination-Participation Restrictions Shop;Community Activity;Cleaning;Meal Prep    Stability/Clinical Decision Making Evolving/Moderate complexity    Clinical Decision Making Moderate    Rehab Potential Good    PT Frequency 2x / week    PT Duration 6 weeks    PT Treatment/Interventions Cryotherapy;ADLs/Self Care Home Management;Ultrasound;Electrical Stimulation;Iontophoresis 4mg /ml Dexamethasone;Moist Heat;Therapeutic activities;Functional mobility training;Stair training;Gait training;Therapeutic exercise;Patient/family education;Neuromuscular re-education;Dry needling;Manual techniques;Taping    PT Next Visit Plan continue flexion bias lumbar/core stabilization, hip strengthening and stretches, manual prn    PT Home Exercise Plan Access Code: [facility]    Consulted and Agree with Plan of  Care Patient           Patient will benefit from skilled therapeutic intervention in order to improve the following deficits and impairments:  Pain, Impaired flexibility, Decreased strength, Decreased activity tolerance, Decreased range of motion, Abnormal gait, Difficulty walking  Visit Diagnosis: Pain in right hip  Pain in left hip  Muscle weakness (generalized)  Chronic bilateral low back pain, unspecified whether sciatica present     Problem List Patient Active Problem List   Diagnosis Date Noted  . History of total knee replacement, left 04/03/2019  . History of total knee arthroplasty, right 04/03/2019  . DERMATOFIBROMA 10/26/2007  . ANEMIA, CHRONIC 10/26/2007  . SICKLE CELL TRAIT 10/26/2007  . ESSENTIAL HYPERTENSION 10/26/2007  . GASTROESOPHAGEAL REFLUX DISEASE 10/26/2007  . GASTRITIS 10/26/2007  . HIATAL HERNIA 10/26/2007  . CONSTIPATION, CHRONIC 10/26/2007  . GUAIAC POSITIVE STOOL 10/26/2007    Beaulah Dinning, PT, DPT 06/27/20 8:01 PM  [facility] [address] [place], [place], [address] Phone: [phone]   Fax:  [phone]  Name: Quetzalli Clos MRN: [id number] Date of Birth: [birth year]

## 2020-07-01 ENCOUNTER — Encounter: Payer: BC Managed Care – PPO | Admitting: Physical Therapy

## 2020-07-02 ENCOUNTER — Ambulatory Visit: Payer: BC Managed Care – PPO | Admitting: Physical Therapy

## 2020-07-03 ENCOUNTER — Encounter: Payer: BC Managed Care – PPO | Admitting: Physical Therapy

## 2020-07-04 ENCOUNTER — Ambulatory Visit: Payer: BC Managed Care – PPO | Admitting: Physical Therapy

## 2020-07-05 ENCOUNTER — Ambulatory Visit: Payer: BC Managed Care – PPO

## 2020-07-09 ENCOUNTER — Encounter: Payer: Self-pay | Admitting: Physical Therapy

## 2020-07-09 ENCOUNTER — Other Ambulatory Visit: Payer: Self-pay

## 2020-07-09 ENCOUNTER — Ambulatory Visit: Payer: BC Managed Care – PPO | Admitting: Physical Therapy

## 2020-07-09 DIAGNOSIS — G8929 Other chronic pain: Secondary | ICD-10-CM

## 2020-07-09 DIAGNOSIS — M25552 Pain in left hip: Secondary | ICD-10-CM

## 2020-07-09 DIAGNOSIS — M6281 Muscle weakness (generalized): Secondary | ICD-10-CM

## 2020-07-09 DIAGNOSIS — M545 Low back pain, unspecified: Secondary | ICD-10-CM

## 2020-07-09 DIAGNOSIS — M25551 Pain in right hip: Secondary | ICD-10-CM

## 2020-07-09 NOTE — Therapy (Signed)
[place], [place], [address] Phone: [phone]   Fax:  [phone]  Physical Therapy Treatment  Patient Details  Name: Taylor Edwards MRN: [id number] Date of Birth: 1941-01-25 Referring Provider (PT): Jean Rosenthal, MD/Gilbert Carlis Abbott, [place]   Encounter Date: 07/09/2020   PT End of Session - 07/09/20 1808    Visit Number 9    Number of Visits 18    Date for PT Re-Evaluation 08/08/20    Authorization Type BCBS/UHC Medicare, next progress note by visit 18    Progress Note Due on Visit 18    PT Start Time 1717    PT Stop Time 1756    PT Time Calculation (min) 39 min    Activity Tolerance Patient tolerated treatment well    Behavior During Therapy [facility] for tasks assessed/performed           Past Medical History:  Diagnosis Date  . Anemia    no current med.  . Arthritis    knees  . Asthma    no current meds.  . Cataract of both eyes    early  . Diabetes mellitus    only taking natural supplements  . GERD (gastroesophageal reflux disease)    no current med.  . Hypertension    has been on med. x 10 yrs.  . IBS (irritable bowel syndrome)   . Post-nasal drip    current cough  . Sickle cell trait ([place])   . Urinary urgency     Past Surgical History:  Procedure Laterality Date  . ABDOMINAL HYSTERECTOMY    . BUNIONECTOMY  08/25/2011   Procedure: [name];  Surgeon: Hessie Dibble, MD;  Location: [place];  Service: Orthopedics;  Laterality: Right;  . CATARACT EXTRACTION     both eyes  . COLONOSCOPY W/ BIOPSIES AND POLYPECTOMY    . DILATION AND CURETTAGE OF UTERUS    . INJECTION KNEE  08/25/2011   Procedure: KNEE INJECTION;  Surgeon: Hessie Dibble, MD;  Location: [place];  Service: Orthopedics;  Laterality: Bilateral;  . JOINT REPLACEMENT    . KNEE ARTHROSCOPY  12/11/2008   bilat., with chondroplasty  . KNEE JOINT MANIPULATION  10/30/2009    bilat.  . OPEN REDUCTION INTERNAL FIXATION (ORIF) DISTAL RADIAL FRACTURE Left 11/17/2014   Procedure: OPEN REDUCTION INTERNAL FIXATION (ORIF) LEFT DISTAL RADIUS  FRACTURE;  Surgeon: Charlotte Crumb, MD;  Location: [place];  Service: Orthopedics;  Laterality: Left;  . TOTAL KNEE ARTHROPLASTY  09/26/2009   bilat.    There were no vitals filed for this visit.   Subjective Assessment - 07/09/20 1752    Subjective Pt. reports has been very busy at work recently and has had increased back pain. She reports that her legs feel "tired" and has had increased difficulty with stairs and has had to use single step gait pattern with stairs due to knee pain issues. She does report some intermittent radiating pain down both legs and reports tendency to want to lean forward with walking, for example leaning forward with hand support in grocery cart. No bowel or bladder changes noted.    Pertinent History Bilateral TKA, diabetic, HTN, IBS, sickle cell trait    Currently in Pain? Yes    Pain Score 4     Pain Location Back    Pain Orientation Lower    Pain Descriptors / Indicators Aching    Pain Type Chronic pain    Pain Onset  More than a month ago    Pain Frequency Constant    Aggravating Factors  walking and standing, stairs    Pain Relieving Factors rest    Effect of Pain on Daily Activities limits positional and activity tolerance                             [facility] Adult PT Treatment/Exercise - 07/09/20 0001      Lumbar Exercises: Stretches   Passive Hamstring Stretch Right;Left;3 reps;30 seconds    ITB Stretch Right;Left;2 reps;30 seconds      Lumbar Exercises: Aerobic   Nustep L5 x 5 min UE/LE      Lumbar Exercises: Seated   Other Seated Lumbar Exercises lumbar flexion with 75 cm P-ball roll out 2x10      Lumbar Exercises: Supine   Dead Bug 20 reps    Dead Bug Limitations 2 lbs. weight ea. UE/LE bilat    Bridge with Ball Squeeze 20 reps    Isometric Hip Flexion 15 reps;3  seconds      Knee/Hip Exercises: Standing   Other Standing Knee Exercises hip hike from 6 in. step x 15 ea. bilat.                  PT Education - 07/09/20 1807    Education Details exercises, potential symptom etiology, MD follow up if LE symptoms continue despite therapy efforts    Person(s) Educated Patient    Methods Explanation;Demonstration;Verbal cues    Comprehension Verbalized understanding;Returned demonstration               PT Long Term Goals - 06/27/20 1950      PT LONG TERM GOAL #1   Title Independent and compliant with HEP performance    Baseline met for previous HEP, will continue to update prn    Time 6    Period Weeks    Status Achieved    Target Date 08/08/20      PT LONG TERM GOAL #2   Title Improve FOTO outcome measure score to 45% or less impairment    Baseline 43% limited    Time 4    Period Weeks    Status Achieved      PT LONG TERM GOAL #3   Title Increase hip abduction strength at least 1/2 MMT grade bilat. to improve gait mechanics to decrease pain with walking    Baseline 4/5 bilat.    Time 6    Period Weeks    Status On-going    Target Date 08/08/20      PT LONG TERM GOAL #4   Title Tolerate standing and walking periods at least 20-30 min for community ambulation and activities such as cooking with hip and low back pain 3/10 or less bilat.    Baseline still ongoing    Time 6    Period Weeks    Status Revised    Target Date 08/08/20      PT LONG TERM GOAL #5   Title Ascend/descend stairs to second story of home with reciprocal gait    Baseline still uses single step pattern    Time 6    Period Weeks    Status On-going    Target Date 08/08/20                 Plan - 07/09/20 1808    Clinical Impression Statement Pt. has had chronic knee and hip pain   issues so some difficulty differentiating potential LE radicular symptoms vs. more local pain in these region but report of LE fatigue and bilat. LE radiating  symptoms could be concerning for underlying stenosis given symptoms and pt. age. Pt. had lumbar MRI in 2019 which per report showed facet arthropathy but no significant stenosis. For now plan continue with flexion bias ROM and core/back strengtening but ultimately if symptoms persist recommend MD follow up for further assessment.    Personal Factors and Comorbidities Time since onset of injury/illness/exacerbation;Comorbidity 3+;Past/Current Experience    Comorbidities diabetic with neuropathy, sickle cell trait, surgical history for knees    Examination-Activity Limitations Stand;Locomotion Level;Stairs    Examination-Participation Restrictions Shop;Community Activity;Cleaning;Meal Prep    Stability/Clinical Decision Making Evolving/Moderate complexity    Clinical Decision Making Moderate    Rehab Potential Good    PT Frequency 2x / week    PT Duration 6 weeks    PT Treatment/Interventions Cryotherapy;ADLs/Self Care Home Management;Ultrasound;Electrical Stimulation;Iontophoresis 58m/ml Dexamethasone;Moist Heat;Therapeutic activities;Functional mobility training;Stair training;Gait training;Therapeutic exercise;Patient/family education;Neuromuscular re-education;Dry needling;Manual techniques;Taping    PT Next Visit Plan continue flexion bias lumbar/core stabilization, hip strengthening and stretches, manual prn    PT Home Exercise Plan Access Code: [facility]   Consulted and Agree with Plan of Care Patient           Patient will benefit from skilled therapeutic intervention in order to improve the following deficits and impairments:  Pain, Impaired flexibility, Decreased strength, Decreased activity tolerance, Decreased range of motion, Abnormal gait, Difficulty walking  Visit Diagnosis: Pain in right hip  Pain in left hip  Muscle weakness (generalized)  Chronic bilateral low back pain, unspecified whether sciatica present     Problem List Patient Active Problem List   Diagnosis  Date Noted  . History of total knee replacement, left 04/03/2019  . History of total knee arthroplasty, right 04/03/2019  . DERMATOFIBROMA 10/26/2007  . ANEMIA, CHRONIC 10/26/2007  . SICKLE CELL TRAIT 10/26/2007  . ESSENTIAL HYPERTENSION 10/26/2007  . GASTROESOPHAGEAL REFLUX DISEASE 10/26/2007  . GASTRITIS 10/26/2007  . HIATAL HERNIA 10/26/2007  . CONSTIPATION, CHRONIC 10/26/2007  . GUAIAC POSITIVE STOOL 10/26/2007    CBeaulah Dinning PT, DPT 07/09/20 6:12 PM  [facility] [address]Phone: [phone]  Fax:  [phone] Name: LDenaisha SwangoMRN: [id number] of Birth: [birth year]

## 2020-07-11 ENCOUNTER — Ambulatory Visit: Payer: BC Managed Care – PPO | Admitting: Physical Therapy

## 2020-07-11 ENCOUNTER — Encounter: Payer: Self-pay | Admitting: Physical Therapy

## 2020-07-11 ENCOUNTER — Other Ambulatory Visit: Payer: Self-pay

## 2020-07-11 DIAGNOSIS — M6281 Muscle weakness (generalized): Secondary | ICD-10-CM

## 2020-07-11 DIAGNOSIS — M25551 Pain in right hip: Secondary | ICD-10-CM | POA: Diagnosis not present

## 2020-07-11 DIAGNOSIS — G8929 Other chronic pain: Secondary | ICD-10-CM

## 2020-07-11 DIAGNOSIS — M25552 Pain in left hip: Secondary | ICD-10-CM

## 2020-07-11 DIAGNOSIS — M545 Low back pain, unspecified: Secondary | ICD-10-CM

## 2020-07-11 NOTE — Therapy (Signed)
[place], [place], [address] Phone: [phone]   Fax:  [phone]  Physical Therapy Treatment  Patient Details  Name: Taylor Edwards MRN: [id number] Date of Birth: [birth year] Referring Provider (PT): Jean Rosenthal, MD/Gilbert Carlis Abbott, [place]   Encounter Date: 07/11/2020   PT End of Session - 07/11/20 1735    Visit Number 10    Number of Visits 18    Date for PT Re-Evaluation 08/08/20    Authorization Type BCBS/UHC Medicare, next progress note by visit 18    Progress Note Due on Visit 18    PT Start Time 1730   arrived late   PT Stop Time 1800    PT Time Calculation (min) 30 min    Activity Tolerance Patient tolerated treatment well    Behavior During Therapy [facility] for tasks assessed/performed           Past Medical History:  Diagnosis Date  . Anemia    no current med.  . Arthritis    knees  . Asthma    no current meds.  . Cataract of both eyes    early  . Diabetes mellitus    only taking natural supplements  . GERD (gastroesophageal reflux disease)    no current med.  . Hypertension    has been on med. x 10 yrs.  . IBS (irritable bowel syndrome)   . Post-nasal drip    current cough  . Sickle cell trait ([place])   . Urinary urgency     Past Surgical History:  Procedure Laterality Date  . ABDOMINAL HYSTERECTOMY    . BUNIONECTOMY  08/25/2011   Procedure: [name];  Surgeon: Hessie Dibble, MD;  Location: [facility];  Service: Orthopedics;  Laterality: Right;  . CATARACT EXTRACTION     both eyes  . COLONOSCOPY W/ BIOPSIES AND POLYPECTOMY    . DILATION AND CURETTAGE OF UTERUS    . INJECTION KNEE  08/25/2011   Procedure: KNEE INJECTION;  Surgeon: Hessie Dibble, MD;  Location: [facility];  Service: Orthopedics;  Laterality: Bilateral;  . JOINT REPLACEMENT    . KNEE ARTHROSCOPY  12/11/2008   bilat., with chondroplasty  . KNEE JOINT  MANIPULATION  10/30/2009   bilat.  . OPEN REDUCTION INTERNAL FIXATION (ORIF) DISTAL RADIAL FRACTURE Left 11/17/2014   Procedure: OPEN REDUCTION INTERNAL FIXATION (ORIF) LEFT DISTAL RADIUS  FRACTURE;  Surgeon: Charlotte Crumb, MD;  Location: [facility];  Service: Orthopedics;  Laterality: Left;  . TOTAL KNEE ARTHROPLASTY  09/26/2009   bilat.    There were no vitals filed for this visit.   Subjective Assessment - 07/11/20 1732    Subjective Pt. reports that the swelling in her legs seems to be getting better. No new complaints/concerns otherwise.    Pertinent History Bilateral TKA, diabetic, HTN, IBS, sickle cell trait                             [facility] Adult PT Treatment/Exercise - 07/11/20 0001      Lumbar Exercises: Stretches   Active Hamstring Stretch Right;Left;2 reps;30 seconds    Active Hamstring Stretch Limitations seated stretch    Other Lumbar Stretch Exercise supine manual glut stretch 3x30 sec ea. bilat.      Lumbar Exercises: Aerobic   Nustep L5 x 5 min UE/LE      Lumbar Exercises: Supine   Dead Bug 20 reps  Bridge with clamshell 20 reps   green band   Isometric Hip Flexion 15 reps;3 seconds      Knee/Hip Exercises: Standing   Functional Squat Limitations 2x10 squat with green Theraband proximal to knees with bilat. UE support on counter    Other Standing Knee Exercises monster walk and sidestepping with green Theraband at ankles with counter support 10 feet x 2 ea.      Knee/Hip Exercises: Sidelying   Clams --   10 ea. bilat. with green band                 PT Education - 07/11/20 1803    Education Details HEP updates, POC    Person(s) Educated Patient    Methods Explanation;Demonstration;Verbal cues;Handout    Comprehension Verbalized understanding;Returned demonstration               PT Long Term Goals - 06/27/20 1950      PT LONG TERM GOAL #1   Title Independent and compliant with HEP performance    Baseline met for previous  HEP, will continue to update prn    Time 6    Period Weeks    Status Achieved    Target Date 08/08/20      PT LONG TERM GOAL #2   Title Improve FOTO outcome measure score to 45% or less impairment    Baseline 43% limited    Time 4    Period Weeks    Status Achieved      PT LONG TERM GOAL #3   Title Increase hip abduction strength at least 1/2 MMT grade bilat. to improve gait mechanics to decrease pain with walking    Baseline 4/5 bilat.    Time 6    Period Weeks    Status On-going    Target Date 08/08/20      PT LONG TERM GOAL #4   Title Tolerate standing and walking periods at least 20-30 min for community ambulation and activities such as cooking with hip and low back pain 3/10 or less bilat.    Baseline still ongoing    Time 6    Period Weeks    Status Revised    Target Date 08/08/20      PT LONG TERM GOAL #5   Title Ascend/descend stairs to second story of home with reciprocal gait    Baseline still uses single step pattern    Time 6    Period Weeks    Status On-going    Target Date 08/08/20                 Plan - 07/11/20 1804    Clinical Impression Statement Session was abbreviated due to pt. running late/traffic after work-progressed standing hip strengthening with band walks for pelvic stability during gait to help address lateral hip pain and continued performance flexion bias lumbar ROM/stabilization. Fair progress overall with LTGs for therapy still ongoing.    Personal Factors and Comorbidities Time since onset of injury/illness/exacerbation;Comorbidity 3+;Past/Current Experience    Comorbidities diabetic with neuropathy, sickle cell trait, surgical history for knees    Examination-Activity Limitations Stand;Locomotion Level;Stairs    Examination-Participation Restrictions Shop;Community Activity;Cleaning;Meal Prep    Stability/Clinical Decision Making Evolving/Moderate complexity    Clinical Decision Making Moderate    Rehab Potential Good    PT  Frequency 2x / week    PT Duration 6 weeks    PT Treatment/Interventions Cryotherapy;ADLs/Self Care Home Management;Ultrasound;Electrical Stimulation;Iontophoresis 4mg /ml Dexamethasone;Moist Heat;Therapeutic activities;Functional mobility training;Stair training;Gait  training;Therapeutic exercise;Patient/family education;Neuromuscular re-education;Dry needling;Manual techniques;Taping    PT Next Visit Plan continue flexion bias lumbar/core stabilization, hip strengthening and stretches, manual prn    PT Home Exercise Plan Access code:[email or web address]    Consulted and Agree with Plan of Care Patient           Patient will benefit from skilled therapeutic intervention in order to improve the following deficits and impairments:  Pain, Impaired flexibility, Decreased strength, Decreased activity tolerance, Decreased range of motion, Abnormal gait, Difficulty walking  Visit Diagnosis: Pain in right hip  Pain in left hip  Muscle weakness (generalized)  Chronic bilateral low back pain, unspecified whether sciatica present     Problem List Patient Active Problem List   Diagnosis Date Noted  . History of total knee replacement, left 04/03/2019  . History of total knee arthroplasty, right 04/03/2019  . DERMATOFIBROMA 10/26/2007  . ANEMIA, CHRONIC 10/26/2007  . SICKLE CELL TRAIT 10/26/2007  . ESSENTIAL HYPERTENSION 10/26/2007  . GASTROESOPHAGEAL REFLUX DISEASE 10/26/2007  . GASTRITIS 10/26/2007  . HIATAL HERNIA 10/26/2007  . CONSTIPATION, CHRONIC 10/26/2007  . GUAIAC POSITIVE STOOL 10/26/2007    Beaulah Dinning, PT, DPT 07/11/20 6:07 PM  [facility] [address] [place], [place], [address] Phone: [phone]   Fax:  [phone]  Name: Pura Picinich MRN: [id number] Date of Birth: 07/14/1941

## 2020-07-20 ENCOUNTER — Ambulatory Visit: Payer: BC Managed Care – PPO | Attending: Internal Medicine

## 2020-07-20 DIAGNOSIS — Z23 Encounter for immunization: Secondary | ICD-10-CM

## 2020-07-20 NOTE — Progress Notes (Signed)
   Covid-19 Vaccination Clinic  Name:  Taylor Edwards    MRN: [id number] DOB: Feb 09, 1941  07/20/2020  Taylor Edwards was observed post Covid-19 immunization for 15 minutes without incident. She was provided with Vaccine Information Sheet and instruction to access the V-Safe system.   Taylor Edwards was instructed to call 911 with any severe reactions post vaccine: Marland Kitchen Difficulty breathing  . Swelling of face and throat  . A fast heartbeat  . A bad rash all over body  . Dizziness and weakness

## 2020-08-02 ENCOUNTER — Ambulatory Visit: Payer: BC Managed Care – PPO | Admitting: Physical Therapy

## 2020-08-06 ENCOUNTER — Other Ambulatory Visit: Payer: Self-pay

## 2020-08-06 ENCOUNTER — Encounter: Payer: Self-pay | Admitting: Physical Therapy

## 2020-08-06 ENCOUNTER — Ambulatory Visit: Payer: BC Managed Care – PPO | Attending: Physician Assistant | Admitting: Physical Therapy

## 2020-08-06 DIAGNOSIS — M25551 Pain in right hip: Secondary | ICD-10-CM

## 2020-08-06 DIAGNOSIS — M25552 Pain in left hip: Secondary | ICD-10-CM | POA: Diagnosis present

## 2020-08-06 DIAGNOSIS — M545 Low back pain, unspecified: Secondary | ICD-10-CM | POA: Diagnosis present

## 2020-08-06 DIAGNOSIS — G8929 Other chronic pain: Secondary | ICD-10-CM | POA: Insufficient documentation

## 2020-08-06 DIAGNOSIS — M6281 Muscle weakness (generalized): Secondary | ICD-10-CM | POA: Diagnosis present

## 2020-08-07 ENCOUNTER — Encounter: Payer: Self-pay | Admitting: Physical Therapy

## 2020-08-07 NOTE — Therapy (Signed)
[place], [place], [address] Phone: [phone]   Fax:  [phone]  Physical Therapy Treatment  Patient Details  Name: Taylor Edwards MRN: [id number] Date of Birth: 09-Sep-1941 Referring Provider (PT): Jean Rosenthal, MD/Gilbert Carlis Abbott, [place]   Encounter Date: 08/06/2020   PT End of Session - 08/07/20 1350    Visit Number 11    Number of Visits 18    Date for PT Re-Evaluation 08/08/20    Authorization Type BCBS/UHC Medicare, next progress note by visit 18    Progress Note Due on Visit 18    PT Start Time 1720    PT Stop Time 1804    PT Time Calculation (min) 44 min    Activity Tolerance Patient tolerated treatment well    Behavior During Therapy [facility] for tasks assessed/performed           Past Medical History:  Diagnosis Date  . Anemia    no current med.  . Arthritis    knees  . Asthma    no current meds.  . Cataract of both eyes    early  . Diabetes mellitus    only taking natural supplements  . GERD (gastroesophageal reflux disease)    no current med.  . Hypertension    has been on med. x 10 yrs.  . IBS (irritable bowel syndrome)   . Post-nasal drip    current cough  . Sickle cell trait ([place])   . Urinary urgency     Past Surgical History:  Procedure Laterality Date  . ABDOMINAL HYSTERECTOMY    . BUNIONECTOMY  08/25/2011   Procedure: [name];  Surgeon: Hessie Dibble, MD;  Location: [facility];  Service: Orthopedics;  Laterality: Right;  . CATARACT EXTRACTION     both eyes  . COLONOSCOPY W/ BIOPSIES AND POLYPECTOMY    . DILATION AND CURETTAGE OF UTERUS    . INJECTION KNEE  08/25/2011   Procedure: KNEE INJECTION;  Surgeon: Hessie Dibble, MD;  Location: [facility];  Service: Orthopedics;  Laterality: Bilateral;  . JOINT REPLACEMENT    . KNEE ARTHROSCOPY  12/11/2008   bilat., with chondroplasty  . KNEE JOINT MANIPULATION  10/30/2009    bilat.  . OPEN REDUCTION INTERNAL FIXATION (ORIF) DISTAL RADIAL FRACTURE Left 11/17/2014   Procedure: OPEN REDUCTION INTERNAL FIXATION (ORIF) LEFT DISTAL RADIUS  FRACTURE;  Surgeon: Charlotte Crumb, MD;  Location: [facility];  Service: Orthopedics;  Laterality: Left;  . TOTAL KNEE ARTHROPLASTY  09/26/2009   bilat.    There were no vitals filed for this visit.   Subjective Assessment - 08/07/20 1343    Subjective Pt. returns, not seen for therapy since 07/11/20 with some scheduling difficulties due to work schedule and clinic availability. She reports that her hips have been doing well recently. LBP still ongoing but minimal today with pain 3/10. She also reports she has been having some right shoulder pain for about the past month. No specific mechanism of injury noted but reports has been sore intermittently with carrying her purse. She reports had similar episode about a year ago which resolved at the time. Her primary current complain for the shoulder is night pain which has been waking her from sleep-not necessarily associated with any specific position/can occur in supine. She reports she has lost around 5 lbs. in the past month or 2 but has been working on trying to lose weight. No personal history of CA bu has  family history of breast CA. See assessment/plan.    Pertinent History Bilateral TKA, diabetic, HTN, IBS, sickle cell trait                             [facility] Adult PT Treatment/Exercise - 08/07/20 0001      Lumbar Exercises: Aerobic   Nustep L5 x 5 min UE/LE      Lumbar Exercises: Standing   Row AROM;Strengthening;20 reps    Theraband Level (Row) Level 4 (Blue)    Shoulder Extension AROM;Both;20 reps    Theraband Level (Shoulder Extension) Level 4 (Blue)      Lumbar Exercises: Supine   Clam 20 reps    Clam Limitations alternating unilat. clamshell with blue band x 20 reps ea. bilat.    Dead Bug 20 reps    Bridge with clamshell 20 reps    Bridge with [employer] Limitations blue band      Knee/Hip Exercises: Stretches   Passive Hamstring Stretch Right;Left;3 reps;30 seconds      Manual Therapy   Joint Mobilization lumbar PAs grade I-II and LAD bilat. hips grade I-III oscillations                  PT Education - 08/07/20 1348    Education Details contact MD regarding shoulder for night pain symptoms, POC    Person(s) Educated Patient    Methods Explanation    Comprehension Verbalized understanding               PT Long Term Goals - 06/27/20 1950      PT LONG TERM GOAL #1   Title Independent and compliant with HEP performance    Baseline met for previous HEP, will continue to update prn    Time 6    Period Weeks    Status Achieved    Target Date 08/08/20      PT LONG TERM GOAL #2   Title Improve FOTO outcome measure score to 45% or less impairment    Baseline 43% limited    Time 4    Period Weeks    Status Achieved      PT LONG TERM GOAL #3   Title Increase hip abduction strength at least 1/2 MMT grade bilat. to improve gait mechanics to decrease pain with walking    Baseline 4/5 bilat.    Time 6    Period Weeks    Status On-going    Target Date 08/08/20      PT LONG TERM GOAL #4   Title Tolerate standing and walking periods at least 20-30 min for community ambulation and activities such as cooking with hip and low back pain 3/10 or less bilat.    Baseline still ongoing    Time 6    Period Weeks    Status Revised    Target Date 08/08/20      PT LONG TERM GOAL #5   Title Ascend/descend stairs to second story of home with reciprocal gait    Baseline still uses single step pattern    Time 6    Period Weeks    Status On-going    Target Date 08/08/20                 Plan - 08/07/20 1350    Clinical Impression Statement Pt. returns after several week absence from therapy progressing well for her hips and low back with improving pain though LBP  symptom still chronic/intermittently exacerbated.  For shoulder pain as reported in subjective (no therapy referral at this point for shoulder) though symptoms could likely be musculoskeletal, concern over night pain symptoms so recommended to pt. that she contact MD for assessment of shoulder (message sent to Dr. Felipa Eth regarding shoulder symptoms as well).    Personal Factors and Comorbidities Time since onset of injury/illness/exacerbation;Comorbidity 3+;Past/Current Experience    Comorbidities diabetic with neuropathy, sickle cell trait, surgical history for knees    Examination-Activity Limitations Stand;Locomotion Level;Stairs    Examination-Participation Restrictions Shop;Community Activity;Cleaning;Meal Prep    Stability/Clinical Decision Making Evolving/Moderate complexity    Clinical Decision Making Moderate    Rehab Potential Good    PT Frequency 2x / week    PT Duration 6 weeks    PT Treatment/Interventions Cryotherapy;ADLs/Self Care Home Management;Ultrasound;Electrical Stimulation;Iontophoresis 4mg /ml Dexamethasone;Moist Heat;Therapeutic activities;Functional mobility training;Stair training;Gait training;Therapeutic exercise;Patient/family education;Neuromuscular re-education;Dry needling;Manual techniques;Taping    PT Next Visit Plan check on shoulder symptoms-did pt. schedule MD visit to assess?, for LBP/hips pending status ERO vs. d/c to HEP    PT Home Exercise Plan Access code:[email or web address]    Consulted and Agree with Plan of Care Patient           Patient will benefit from skilled therapeutic intervention in order to improve the following deficits and impairments:  Pain, Impaired flexibility, Decreased strength, Decreased activity tolerance, Decreased range of motion, Abnormal gait, Difficulty walking  Visit Diagnosis: Pain in right hip  Pain in left hip  Muscle weakness (generalized)  Chronic bilateral low back pain, unspecified whether sciatica present     Problem List Patient Active Problem List   Diagnosis  Date Noted  . History of total knee replacement, left 04/03/2019  . History of total knee arthroplasty, right 04/03/2019  . DERMATOFIBROMA 10/26/2007  . ANEMIA, CHRONIC 10/26/2007  . SICKLE CELL TRAIT 10/26/2007  . ESSENTIAL HYPERTENSION 10/26/2007  . GASTROESOPHAGEAL REFLUX DISEASE 10/26/2007  . GASTRITIS 10/26/2007  . HIATAL HERNIA 10/26/2007  . CONSTIPATION, CHRONIC 10/26/2007  . GUAIAC POSITIVE STOOL 10/26/2007    Beaulah Dinning, PT, DPT 08/07/20 1:57 PM  [facility] [address] [place], [place], [address] Phone: [phone]   Fax:  [phone]  Name: Taylor Edwards MRN: [id number] Date of Birth: [birth year]

## 2020-08-09 ENCOUNTER — Ambulatory Visit: Payer: BC Managed Care – PPO

## 2020-08-13 ENCOUNTER — Ambulatory Visit: Payer: BC Managed Care – PPO

## 2020-08-22 ENCOUNTER — Other Ambulatory Visit: Payer: Self-pay

## 2020-08-22 ENCOUNTER — Encounter: Payer: Self-pay | Admitting: Physical Therapy

## 2020-08-22 ENCOUNTER — Ambulatory Visit: Payer: BC Managed Care – PPO | Admitting: Physical Therapy

## 2020-08-22 DIAGNOSIS — M25552 Pain in left hip: Secondary | ICD-10-CM | POA: Insufficient documentation

## 2020-08-22 DIAGNOSIS — M6281 Muscle weakness (generalized): Secondary | ICD-10-CM | POA: Insufficient documentation

## 2020-08-22 DIAGNOSIS — G8929 Other chronic pain: Secondary | ICD-10-CM | POA: Insufficient documentation

## 2020-08-22 DIAGNOSIS — M25551 Pain in right hip: Secondary | ICD-10-CM | POA: Insufficient documentation

## 2020-08-22 DIAGNOSIS — M545 Low back pain, unspecified: Secondary | ICD-10-CM | POA: Insufficient documentation

## 2020-08-22 NOTE — Therapy (Signed)
[place], [place], [address] Phone: [phone]   Fax:  [phone]  Physical Therapy Treatment/Discharge  Patient Details  Name: Taylor Edwards MRN: [id number] Date of Birth: December 15, 1940 Referring Provider (PT): Jean Rosenthal, MD/Gilbert Carlis Abbott, [place]   Encounter Date: 08/22/2020   PT End of Session - 08/22/20 1752    Visit Number 12    PT Start Time 9163   pt. arrived late   PT Stop Time 1735    PT Time Calculation (min) 10 min           Past Medical History:  Diagnosis Date  . Anemia    no current med.  . Arthritis    knees  . Asthma    no current meds.  . Cataract of both eyes    early  . Diabetes mellitus    only taking natural supplements  . GERD (gastroesophageal reflux disease)    no current med.  . Hypertension    has been on med. x 10 yrs.  . IBS (irritable bowel syndrome)   . Post-nasal drip    current cough  . Sickle cell trait ([place])   . Urinary urgency     Past Surgical History:  Procedure Laterality Date  . ABDOMINAL HYSTERECTOMY    . BUNIONECTOMY  08/25/2011   Procedure: [name];  Surgeon: Hessie Dibble, MD;  Location: [facility];  Service: Orthopedics;  Laterality: Right;  . CATARACT EXTRACTION     both eyes  . COLONOSCOPY W/ BIOPSIES AND POLYPECTOMY    . DILATION AND CURETTAGE OF UTERUS    . INJECTION KNEE  08/25/2011   Procedure: KNEE INJECTION;  Surgeon: Hessie Dibble, MD;  Location: [facility];  Service: Orthopedics;  Laterality: Bilateral;  . JOINT REPLACEMENT    . KNEE ARTHROSCOPY  12/11/2008   bilat., with chondroplasty  . KNEE JOINT MANIPULATION  10/30/2009   bilat.  . OPEN REDUCTION INTERNAL FIXATION (ORIF) DISTAL RADIAL FRACTURE Left 11/17/2014   Procedure: OPEN REDUCTION INTERNAL FIXATION (ORIF) LEFT DISTAL RADIUS  FRACTURE;  Surgeon: Charlotte Crumb, MD;  Location: [facility];  Service: Orthopedics;   Laterality: Left;  . TOTAL KNEE ARTHROPLASTY  09/26/2009   bilat.    There were no vitals filed for this visit.   Subjective Assessment - 08/22/20 1727    Subjective Pt. arrived for visit as scheduled for hip and lumbar pain but was hoping to be seen today for therapy for shoulder pain which she has had for the past 1-2 months. She had reported shoulder pain to therapist at last visit along with prominent night pain symptoms and was advised to contact MD office for further assessment. She reports contacted her PCP office via phone and had requested therapy referral for her shoulder and thought one had been sent over but clinic has not received one. She reports that shoulder pain has worsened and now radiates down to elbow region on right and has also started having some left-sided symptoms as well. She continues to have pain at night particularly with sidelying positions. She reports her hips are doing better and lumbar issues still chronic/ongoing. She does not wish to do therapy today for hip and lumbar region and wishes to d/c therapy for these regions. Regarding shoulder symptoms advised again to contact MD office regarding setting up appointment to be evaluated for this by physician before considering therapy particularly given prominent night pain and new onset left shoulder pain symptoms/potential  radicular symptoms along with family history of breast CA. Pt. reports will contact MD office tomorrow to set up appointment for assessment of this and pending status at MD visit if MD recommends therapy will await new referral. Otherwise plan d/c previous plan of care for lumbar and hip regions.                                          PT Long Term Goals - 06/27/20 1950      PT LONG TERM GOAL #1   Title Independent and compliant with HEP performance    Baseline met for previous HEP, will continue to update prn    Time 6    Period Weeks    Status Achieved    Target  Date 08/08/20      PT LONG TERM GOAL #2   Title Improve FOTO outcome measure score to 45% or less impairment    Baseline 43% limited    Time 4    Period Weeks    Status Achieved      PT LONG TERM GOAL #3   Title Increase hip abduction strength at least 1/2 MMT grade bilat. to improve gait mechanics to decrease pain with walking    Baseline 4/5 bilat.    Time 6    Period Weeks    Status On-going    Target Date 08/08/20      PT LONG TERM GOAL #4   Title Tolerate standing and walking periods at least 20-30 min for community ambulation and activities such as cooking with hip and low back pain 3/10 or less bilat.    Baseline still ongoing    Time 6    Period Weeks    Status Revised    Target Date 08/08/20      PT LONG TERM GOAL #5   Title Ascend/descend stairs to second story of home with reciprocal gait    Baseline still uses single step pattern    Time 6    Period Weeks    Status On-going    Target Date 08/08/20                  Patient will benefit from skilled therapeutic intervention in order to improve the following deficits and impairments:     Visit Diagnosis: Pain in right hip  Pain in left hip  Muscle weakness (generalized)  Chronic bilateral low back pain, unspecified whether sciatica present     Problem List Patient Active Problem List   Diagnosis Date Noted  . History of total knee replacement, left 04/03/2019  . History of total knee arthroplasty, right 04/03/2019  . DERMATOFIBROMA 10/26/2007  . ANEMIA, CHRONIC 10/26/2007  . SICKLE CELL TRAIT 10/26/2007  . ESSENTIAL HYPERTENSION 10/26/2007  . GASTROESOPHAGEAL REFLUX DISEASE 10/26/2007  . GASTRITIS 10/26/2007  . HIATAL HERNIA 10/26/2007  . CONSTIPATION, CHRONIC 10/26/2007  . GUAIAC POSITIVE STOOL 10/26/2007        PHYSICAL THERAPY DISCHARGE SUMMARY  Visits from Start of Care:12  Current functional level related to goals / functional outcomes: See above   Remaining  deficits: Continued chronic LBP   Education / Equipment: Patient education regarding recommendation for MD visit to assess shoulder pain symptoms Plan: Patient agrees to discharge.  Patient goals were partially met. Patient is being discharged due to the patient's request.  ?????  Beaulah Dinning, PT, DPT 08/22/20 5:59 PM        [facility] [address] [place], [place], [address] Phone: [phone]   Fax:  [phone]  Name: Zariel Capano MRN: [id number] Date of Birth: 12-30-1940

## 2020-10-07 ENCOUNTER — Ambulatory Visit: Payer: BC Managed Care – PPO | Admitting: Orthopaedic Surgery

## 2020-10-08 ENCOUNTER — Ambulatory Visit: Payer: BC Managed Care – PPO

## 2020-10-08 ENCOUNTER — Ambulatory Visit: Payer: Medicare Other | Admitting: Physical Therapy

## 2020-10-29 ENCOUNTER — Ambulatory Visit: Payer: Medicare Other | Admitting: Physical Therapy

## 2020-10-30 ENCOUNTER — Ambulatory Visit: Payer: Medicare Other

## 2020-10-30 ENCOUNTER — Other Ambulatory Visit: Payer: Self-pay

## 2020-10-30 DIAGNOSIS — M25551 Pain in right hip: Secondary | ICD-10-CM | POA: Insufficient documentation

## 2020-10-30 DIAGNOSIS — M6281 Muscle weakness (generalized): Secondary | ICD-10-CM | POA: Insufficient documentation

## 2020-10-30 DIAGNOSIS — M545 Low back pain, unspecified: Secondary | ICD-10-CM | POA: Insufficient documentation

## 2020-10-30 DIAGNOSIS — G8929 Other chronic pain: Secondary | ICD-10-CM | POA: Insufficient documentation

## 2020-10-30 DIAGNOSIS — M25552 Pain in left hip: Secondary | ICD-10-CM | POA: Insufficient documentation

## 2020-10-30 NOTE — Therapy (Signed)
[place], [place], [address] Phone: [phone]   Fax:  [phone]  Physical Therapy Treatment  Patient Details  Name: Taylor Edwards MRN: [id number] Date of Birth: September 07, 1941 Referring Provider (PT): Jean Rosenthal, MD/Gilbert Carlis Abbott, [place]   Encounter Date: 10/30/2020    Past Medical History:  Diagnosis Date  . Anemia    no current med.  . Arthritis    knees  . Asthma    no current meds.  . Cataract of both eyes    early  . Diabetes mellitus    only taking natural supplements  . GERD (gastroesophageal reflux disease)    no current med.  . Hypertension    has been on med. x 10 yrs.  . IBS (irritable bowel syndrome)   . Post-nasal drip    current cough  . Sickle cell trait ([place])   . Urinary urgency     Past Surgical History:  Procedure Laterality Date  . ABDOMINAL HYSTERECTOMY    . BUNIONECTOMY  08/25/2011   Procedure: [name];  Surgeon: Hessie Dibble, MD;  Location: [place];  Service: Orthopedics;  Laterality: Right;  . CATARACT EXTRACTION     both eyes  . COLONOSCOPY W/ BIOPSIES AND POLYPECTOMY    . DILATION AND CURETTAGE OF UTERUS    . INJECTION KNEE  08/25/2011   Procedure: KNEE INJECTION;  Surgeon: Hessie Dibble, MD;  Location: [facility];  Service: Orthopedics;  Laterality: Bilateral;  . JOINT REPLACEMENT    . KNEE ARTHROSCOPY  12/11/2008   bilat., with chondroplasty  . KNEE JOINT MANIPULATION  10/30/2009   bilat.  . OPEN REDUCTION INTERNAL FIXATION (ORIF) DISTAL RADIAL FRACTURE Left 11/17/2014   Procedure: OPEN REDUCTION INTERNAL FIXATION (ORIF) LEFT DISTAL RADIUS  FRACTURE;  Surgeon: Charlotte Crumb, MD;  Location: [facility];  Service: Orthopedics;  Laterality: Left;  . TOTAL KNEE ARTHROPLASTY  09/26/2009   bilat.    There were no vitals filed for this  visit.                                          Plan - 10/30/20 1809    Clinical Impression Statement Patient arrived for initial evaluation for bilateral shoulder pain, though requested to be treated by Consuello Bossier who was her previous therapist for another condition. PT educated patient that evaluation could be performed tonight and she could schedule follow-up visits with requested provider. Patient declined and prefers to wait to be seen by Consuello Bossier for evaluation at this time.           Patient will benefit from skilled therapeutic intervention in order to improve the following deficits and impairments:     Visit Diagnosis: Chronic left shoulder pain     Problem List Patient Active Problem List   Diagnosis Date Noted  . History of total knee replacement, left 04/03/2019  . History of total knee arthroplasty, right 04/03/2019  . DERMATOFIBROMA 10/26/2007  . ANEMIA, CHRONIC 10/26/2007  . SICKLE CELL TRAIT 10/26/2007  . ESSENTIAL HYPERTENSION 10/26/2007  . GASTROESOPHAGEAL REFLUX DISEASE 10/26/2007  . GASTRITIS 10/26/2007  . HIATAL HERNIA 10/26/2007  . CONSTIPATION, CHRONIC 10/26/2007  . GUAIAC POSITIVE STOOL 10/26/2007   Gwendolyn Grant, PT, DPT, ATC 10/30/20 6:20 PM  [facility] [address] [place], [place], [address] Phone: [phone]  Fax:  [phone]  Name: Taylor Edwards MRN: [id number] Date of Birth: [birth year]

## 2020-11-11 ENCOUNTER — Ambulatory Visit: Payer: Medicare Other | Admitting: Physical Therapy

## 2020-11-12 ENCOUNTER — Ambulatory Visit
Admission: RE | Admit: 2020-11-12 | Discharge: 2020-11-12 | Disposition: A | Discharge status: Home / Self Care | Payer: Medicare Other | Source: Ambulatory Visit | Attending: Geriatric Medicine | Admitting: Geriatric Medicine

## 2020-11-12 ENCOUNTER — Other Ambulatory Visit: Payer: Self-pay

## 2020-11-12 DIAGNOSIS — L818 Other specified disorders of pigmentation: Secondary | ICD-10-CM | POA: Diagnosis not present

## 2020-11-12 DIAGNOSIS — Z1231 Encounter for screening mammogram for malignant neoplasm of breast: Secondary | ICD-10-CM | POA: Diagnosis not present

## 2020-11-12 DIAGNOSIS — L821 Other seborrheic keratosis: Secondary | ICD-10-CM | POA: Diagnosis not present

## 2020-11-14 ENCOUNTER — Ambulatory Visit: Payer: Medicare Other | Admitting: Physical Therapy

## 2020-11-26 ENCOUNTER — Other Ambulatory Visit: Payer: Self-pay

## 2020-11-26 ENCOUNTER — Encounter: Payer: Self-pay | Admitting: Physical Therapy

## 2020-11-26 ENCOUNTER — Ambulatory Visit: Payer: BC Managed Care – PPO | Attending: Geriatric Medicine | Admitting: Physical Therapy

## 2020-11-26 DIAGNOSIS — M25512 Pain in left shoulder: Secondary | ICD-10-CM | POA: Diagnosis not present

## 2020-11-26 DIAGNOSIS — M25511 Pain in right shoulder: Secondary | ICD-10-CM | POA: Diagnosis not present

## 2020-11-26 DIAGNOSIS — M6281 Muscle weakness (generalized): Secondary | ICD-10-CM | POA: Diagnosis not present

## 2020-11-26 DIAGNOSIS — G8929 Other chronic pain: Secondary | ICD-10-CM | POA: Insufficient documentation

## 2020-11-26 DIAGNOSIS — M25611 Stiffness of right shoulder, not elsewhere classified: Secondary | ICD-10-CM | POA: Insufficient documentation

## 2020-11-26 NOTE — Therapy (Signed)
[place], [place], [address] Phone: [phone]   Fax:  [phone]  Physical Therapy Evaluation  Patient Details  Name: Taylor Edwards MRN: [id number] Date of Birth: 1941/03/08 Referring Provider (PT): Lajean Manes, MD   Encounter Date: 11/26/2020   PT End of Session - 11/26/20 1206    Visit Number 1    Number of Visits 12    Date for PT Re-Evaluation 01/07/21    Authorization Type [employer] Medicare primary, progress note by visit 10    PT Start Time 1101    PT Stop Time 1145    PT Time Calculation (min) 44 min    Activity Tolerance Patient limited by pain    Behavior During Therapy [facility] for tasks assessed/performed           Past Medical History:  Diagnosis Date  . Anemia    no current med.  . Arthritis    knees  . Asthma    no current meds.  . Cataract of both eyes    early  . Diabetes mellitus    only taking natural supplements  . GERD (gastroesophageal reflux disease)    no current med.  . Hypertension    has been on med. x 10 yrs.  . IBS (irritable bowel syndrome)   . Post-nasal drip    current cough  . Sickle cell trait ([place])   . Urinary urgency     Past Surgical History:  Procedure Laterality Date  . ABDOMINAL HYSTERECTOMY    . BUNIONECTOMY  08/25/2011   Procedure: [name];  Surgeon: Hessie Dibble, MD;  Location: [place];  Service: Orthopedics;  Laterality: Right;  . CATARACT EXTRACTION     both eyes  . COLONOSCOPY W/ BIOPSIES AND POLYPECTOMY    . DILATION AND CURETTAGE OF UTERUS    . INJECTION KNEE  08/25/2011   Procedure: KNEE INJECTION;  Surgeon: Hessie Dibble, MD;  Location: [place];  Service: Orthopedics;  Laterality: Bilateral;  . JOINT REPLACEMENT    . KNEE ARTHROSCOPY  12/11/2008   bilat., with chondroplasty  . KNEE JOINT MANIPULATION  10/30/2009   bilat.  . OPEN REDUCTION INTERNAL FIXATION (ORIF) DISTAL RADIAL  FRACTURE Left 11/17/2014   Procedure: OPEN REDUCTION INTERNAL FIXATION (ORIF) LEFT DISTAL RADIUS  FRACTURE;  Surgeon: Charlotte Crumb, MD;  Location: [place];  Service: Orthopedics;  Laterality: Left;  . TOTAL KNEE ARTHROPLASTY  09/26/2009   bilat.    There were no vitals filed for this visit.    Subjective Assessment - 11/26/20 1105    Subjective Pt. is a 80 y/o female previously seen for PT last year for hip and lumbar pain with new referral received for bilateral right>left shoulder pain. She reports insidious onset of right shoulder symptoms around last October-no mechanism of injury noted-began having pain with reaching activities as well as noting increased pain and soreness at night. Onset of left shoulder symptoms was a couple of months later also with no mechanism of injury. No imaging to date for shoulders. She also reports has been having numbness>pain in both of her thumbs bilaterally. No radiating symptoms between shoulder and thumb. No unexplained weight loss.Pt thinks she may have been having night sweats as she wakes up with her hair wet at times. No personal history of CA.    Pertinent History Bilateral TKA, diabetic, HTN, IBS, sickle cell trait    Diagnostic tests no imaging for shoulder, recent mammogram  11/12/20 (-)    Patient Stated Goals Get shoulders better    Currently in Pain? Yes    Pain Score 8    left side 1/10   Pain Location Shoulder    Pain Orientation Left;Right    Pain Descriptors / Indicators Aching    Pain Type Chronic pain    Pain Radiating Towards numbness>pain in bilateral thumbs, has some pain into proximal arm on right    Pain Onset More than a month ago    Pain Frequency Constant    Aggravating Factors  reaching, shoulder movement especially reaching back, worse at night    Pain Relieving Factors "mobility", sometimes ease with IcyHot    Effect of Pain on Daily Activities limits ability reaching ADLs and causes sleep disturbance              [facility]  PT Assessment - 11/26/20 0001      Assessment   Medical Diagnosis Bilateral shoulder pain    Referring Provider (PT) Lajean Manes, MD    Onset Date/Surgical Date --   right side onset around 06/21/20, left side 2-3 months later   Hand Dominance Right    Next MD Visit 11/27/2020    Prior Therapy past PT for hips and low back      Precautions   Precautions None      Restrictions   Weight Bearing Restrictions No      Balance Screen   Has the patient fallen in the past 6 months No      Prior Function   Level of Independence Independent with basic ADLs      Cognition   Overall Cognitive Status Within Functional Limits for tasks assessed      Observation/Other Assessments   Focus on Therapeutic Outcomes (FOTO)  FOTO not tested due to incorrect set up/wrong body region, see QuickDASH which was assessed as alternative    Other Surveys  Quick Dash    Quick DASH  40.91%      Posture/Postural Control   Posture/Postural Control Postural limitations    Postural Limitations Forward head      Deep Tendon Reflexes   DTR Assessment Site Biceps;Brachioradialis;Triceps    Biceps DTR 2+    Brachioradialis DTR 2+    Triceps DTR 2+      ROM / Strength   AROM / PROM / Strength PROM      AROM   AROM Assessment Site Shoulder    Right/Left Shoulder Right;Left    Right Shoulder Flexion 150 Degrees    Right Shoulder ABduction 120 Degrees    Right Shoulder Internal Rotation --   reach to T8   Right Shoulder External Rotation 70 Degrees   elbow at side   Left Shoulder Flexion 158 Degrees    Left Shoulder ABduction 150 Degrees    Left Shoulder Internal Rotation --   reach to T6   Left Shoulder External Rotation 80 Degrees   elbow at side     PROM   PROM Assessment Site Shoulder    Right/Left Shoulder Right    Right Shoulder Flexion 150 Degrees    Right Shoulder ABduction 130 Degrees    Right Shoulder Internal Rotation 55 Degrees    Right Shoulder External Rotation 90 Degrees       Strength   Strength Assessment Site Elbow;Shoulder    Right/Left Shoulder Right;Left    Right Shoulder Flexion 4/5    Right Shoulder ABduction 3+/5    Right Shoulder Internal Rotation 5/5  Right Shoulder External Rotation 4/5    Left Shoulder Flexion 5/5    Left Shoulder Internal Rotation 5/5    Left Shoulder External Rotation 4+/5    Right/Left Elbow Right;Left    Right Elbow Flexion 5/5    Right Elbow Extension 5/5    Left Elbow Flexion 5/5    Left Elbow Extension 5/5      Palpation   Palpation comment mild right posterior capsule tightness, tender to palpation right middle deltoid region      Special Tests   Other special tests (+) right infraspinatus test for weakness, (-) drop arm, (-) Neer and Hawkin's bilat., Hoffmand's and inverted supinator sign bilat., Finkelstein's (+) on right for wrist pain, Checked Spurling's and increased numbness noted on right side test with right thimb numbness                      Objective measurements completed on examination: See above findings.       [facility] Adult PT Treatment/Exercise - 11/26/20 0001      Exercises   Exercises --   HEP handout review/brief demo                 PT Education - 11/26/20 1206    Education Details HEP, shoulder anatomy, potential symptom etiology, alert MD night symptoms/night sweats at follow up 11/27/20    Person(s) Educated Patient    Methods Explanation;Demonstration;Verbal cues;Handout    Comprehension Returned demonstration;Verbalized understanding            PT Short Term Goals - 11/26/20 1306      PT SHORT TERM GOAL #1   Title Independent with initial HEP    Baseline instructed at eval    Time 3    Period Weeks    Status New    Target Date 12/17/20      PT SHORT TERM GOAL #2   Title Increase right shoulder abduction AROM at least 10 deg to improve ability for dressing and overhead reaching for chores    Baseline 120 deg    Time 3    Period Weeks    Status New     Target Date 12/10/20             PT Long Term Goals - 11/26/20 1307      PT LONG TERM GOAL #1   Title Improve QuickDASH score to 20% or less impairment due to shoulder pain    Baseline 40.91% limited    Time 6    Period Weeks    Status New    Target Date 01/07/21      PT LONG TERM GOAL #2   Title Increase shoulder strength for right shoulder flexion and abduction and bilat. 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 presents with insidious onset right>left shoulder pain with pain, limitations for reaching activities and associated sleep disturbance with pain at night. She presents with right>left shoulder weakness in ER and right side weakness in abduction>flexion. Differential diagnosis could include rotator cuff pathology/subacromial pain syndrome right>left. Regarding thumb/hand numbnes bilaterally no clinical findings suggestive of myelopathy. She did note increased numbness in right thumb with Spurling's on right otherwise no cervical associated noted during eval so differential diagnosis could include local median vs. radial nerve entrapment. Though rotator cuff pathology could present with night pain and pt. did have clinical findings suggestive of rotator cuff pathology concern for persistent night pain  symptoms and also report of potential night sweats so advised pt. to follow up with MD regarding these. Pt. also did have some tightness/capsular restriction of end-range right side flexion and abduction so differential diagnosis could include early frozen shoulder as well but pain has been an issue>stiffness. Pt. would benefit from PT to help relieve pain and address associated functional limitations for reaching ADLs and sleep disturbance.    Personal Factors and Comorbidities Comorbidity 3+   see PMH   Comorbidities diabetic with neuropathy, sickle cell trait, chronic pain history    Examination-Activity Limitations Lift;Sleep;Hygiene/Grooming;Dressing;Carry;Reach Overhead;Bathing    Examination-Participation Restrictions Driving;Cleaning    Stability/Clinical Decision Making Evolving/Moderate complexity    Clinical Decision Making Moderate    Rehab Potential Good    PT Frequency 2x / week    PT Duration 6 weeks    PT Treatment/Interventions ADLs/Self Care Home Management;Cryotherapy;Electrical Stimulation;Moist Heat;Iontophoresis 4mg /ml Dexamethasone;Therapeutic exercise;Patient/family education;Dry needling;Manual techniques;Neuromuscular re-education;Therapeutic activities;Taping    PT Next Visit Plan Check on status form MD follow up 11/27/20(any further testing needed for night pain/sweats?), continue/progress rotator cuff and periscapular strengthening, deltoid strengthening as tolerated pending pain, review HEP as needed, monitor hand symptoms and perform further cervical screen as needed, check Phalens' for concordant symptoms    PT Home Exercise Plan Access code: [email or web address]    Consulted and Agree with Plan of Care Patient           Patient will benefit from skilled therapeutic intervention in order to improve the following deficits and impairments:  Pain,Postural dysfunction,Impaired flexibility,Decreased strength,Decreased range of motion,Impaired sensation,Impaired UE functional  use  Visit Diagnosis: Chronic right shoulder pain  Stiffness of right shoulder, not elsewhere classified  Chronic left shoulder pain  Muscle weakness (generalized)     Problem List Patient Active Problem List   Diagnosis Date Noted  . History of total knee replacement, left 04/03/2019  . History of total knee arthroplasty, right 04/03/2019  . DERMATOFIBROMA 10/26/2007  . ANEMIA, CHRONIC 10/26/2007  . SICKLE CELL TRAIT 10/26/2007  . ESSENTIAL HYPERTENSION 10/26/2007  . GASTROESOPHAGEAL REFLUX DISEASE 10/26/2007  . GASTRITIS 10/26/2007  . HIATAL HERNIA 10/26/2007  . CONSTIPATION, CHRONIC 10/26/2007  . GUAIAC POSITIVE STOOL 10/26/2007    Beaulah Dinning, PT, DPT 11/26/20 1:13 PM  [facility] [address] [place], [place], [address] Phone: [phone]   Fax:  [phone]  Name: Taylor Edwards MRN: [id number] Date of Birth: November 13, 1940

## 2020-12-03 ENCOUNTER — Encounter: Payer: Medicare Other | Admitting: Physical Therapy

## 2020-12-03 ENCOUNTER — Encounter: Payer: Self-pay | Admitting: Physical Therapy

## 2020-12-03 ENCOUNTER — Other Ambulatory Visit: Payer: Self-pay

## 2020-12-03 ENCOUNTER — Ambulatory Visit: Payer: BC Managed Care – PPO | Admitting: Physical Therapy

## 2020-12-03 DIAGNOSIS — M25512 Pain in left shoulder: Secondary | ICD-10-CM | POA: Diagnosis not present

## 2020-12-03 DIAGNOSIS — M25611 Stiffness of right shoulder, not elsewhere classified: Secondary | ICD-10-CM

## 2020-12-03 DIAGNOSIS — M25511 Pain in right shoulder: Secondary | ICD-10-CM

## 2020-12-03 DIAGNOSIS — E119 Type 2 diabetes mellitus without complications: Secondary | ICD-10-CM | POA: Diagnosis not present

## 2020-12-03 DIAGNOSIS — M6281 Muscle weakness (generalized): Secondary | ICD-10-CM

## 2020-12-03 DIAGNOSIS — G8929 Other chronic pain: Secondary | ICD-10-CM

## 2020-12-03 NOTE — Therapy (Signed)
[place], [place], [address] Phone: [phone]   Fax:  [phone]  Physical Therapy Treatment  Patient Details  Name: Taylor Edwards MRN: [id number] Date of Birth: 08-27-1941 Referring Provider (PT): Lajean Manes, MD   Encounter Date: 12/03/2020   PT End of Session - 12/03/20 1138    Visit Number 2    Number of Visits 12    Date for PT Re-Evaluation 01/07/21    Authorization Type UHC Medicare primary, progress note by visit 10    PT Start Time 1105    PT Stop Time 1156    PT Time Calculation (min) 51 min    Activity Tolerance Patient limited by pain    Behavior During Therapy [facility] for tasks assessed/performed           Past Medical History:  Diagnosis Date  . Anemia    no current med.  . Arthritis    knees  . Asthma    no current meds.  . Cataract of both eyes    early  . Diabetes mellitus    only taking natural supplements  . GERD (gastroesophageal reflux disease)    no current med.  . Hypertension    has been on med. x 10 yrs.  . IBS (irritable bowel syndrome)   . Post-nasal drip    current cough  . Sickle cell trait ([place])   . Urinary urgency     Past Surgical History:  Procedure Laterality Date  . ABDOMINAL HYSTERECTOMY    . BUNIONECTOMY  08/25/2011   Procedure: [name];  Surgeon: Hessie Dibble, MD;  Location: [facility];  Service: Orthopedics;  Laterality: Right;  . CATARACT EXTRACTION     both eyes  . COLONOSCOPY W/ BIOPSIES AND POLYPECTOMY    . DILATION AND CURETTAGE OF UTERUS    . INJECTION KNEE  08/25/2011   Procedure: KNEE INJECTION;  Surgeon: Hessie Dibble, MD;  Location: [facility];  Service: Orthopedics;  Laterality: Bilateral;  . JOINT REPLACEMENT    . KNEE ARTHROSCOPY  12/11/2008   bilat., with chondroplasty  . KNEE JOINT MANIPULATION  10/30/2009   bilat.  . OPEN REDUCTION INTERNAL FIXATION (ORIF) DISTAL RADIAL  FRACTURE Left 11/17/2014   Procedure: OPEN REDUCTION INTERNAL FIXATION (ORIF) LEFT DISTAL RADIUS  FRACTURE;  Surgeon: Charlotte Crumb, MD;  Location: [facility];  Service: Orthopedics;  Laterality: Left;  . TOTAL KNEE ARTHROPLASTY  09/26/2009   bilat.    There were no vitals filed for this visit.   Subjective Assessment - 12/03/20 1121    Subjective Pt. reports right shoulder pain 4/10 pre-tx./overall about the same as status at eval. She reports that she had had a seizure on 11/15/20-she had MD follow up for this last week-no imaging planned at this time but plan would be for CT or MRI if having further symptoms. Pt. reports she had consumed some salt water for "cleanse" for about 7 days prior to symptoms so potentially attributed to this. She also saw Dr. Denton Ar forgot to mention night sweat symptoms but is scheduled for forthcoming labs in the next 1-2 weeks. She has still had some numbness in bilateral thumbs which she reports has been chronic.    Pertinent History Bilateral TKA, diabetic, HTN, IBS, sickle cell trait    Currently in Pain? Yes    Pain Score 4     Pain Location Shoulder    Pain Orientation Right    Pain  Descriptors / Indicators Aching    Pain Type Chronic pain    Pain Onset More than a month ago    Pain Frequency Constant    Aggravating Factors  reaching motions    Effect of Pain on Daily Activities limits ability for reaching ADLs              [facility] PT Assessment - 12/03/20 0001      AROM   Overall AROM Comments checked cervical ROM and no concordant pain or parasthesias in UE with cervical ROM, Spulring's (-) bilat.                         [facility] Adult PT Treatment/Exercise - 12/03/20 0001      Exercises   Exercises Shoulder      Shoulder Exercises: Supine   Other Supine Exercises pt. deferred supine exercises-reported would be too painful for her hips      Shoulder Exercises: Sidelying   External Rotation AROM;Right;20 reps    External  Rotation Limitations started with 1 lb. but too difficulty so switched to bodyweight only    ABduction AAROM;Right;20 reps    ABduction Limitations scaption 2x10 slight assist from therapist      Shoulder Exercises: Standing   External Rotation AROM;Strengthening;Right;20 reps    Theraband Level (Shoulder External Rotation) Level 2 (Red)    Internal Rotation AROM;Strengthening;Right;20 reps    Theraband Level (Shoulder Internal Rotation) Level 2 (Red)    Flexion Limitations [place] flexion/punch yellow band 2x10 right side    Extension AROM;Strengthening;Both;20 reps    Theraband Level (Shoulder Extension) Level 2 (Red)    Row AROM;Strengthening;Both;20 reps    Theraband Level (Shoulder Row) Level 2 (Red)    Row Limitations cues for elbow positon and angle ROM    Other Standing Exercises UE ranger flexion  and scaption AAROM 2x10 ea.    Other Standing Exercises bicep curl right side 2x10 with 2 lbs.      Shoulder Exercises: Isometric Strengthening   ABduction Limitations 5 sec x 15 reps with ball right side      Modalities   Modalities Cryotherapy      Cryotherapy   Number Minutes Cryotherapy 10 Minutes    Cryotherapy Location Shoulder   right side   Type of Cryotherapy Ice pack      Manual Therapy   Soft tissue mobilization STM right posterior shoulder and middle deltoid region                  PT Education - 12/03/20 1218    Education Details symptom etiology, inform/follow up with MD re: night sweat symptoms    Person(s) Educated Patient    Methods Explanation    Comprehension Verbalized understanding            PT Short Term Goals - 11/26/20 1306      PT SHORT TERM GOAL #1   Title Independent with initial HEP    Baseline instructed at eval    Time 3    Period Weeks    Status New    Target Date 12/17/20      PT SHORT TERM GOAL #2   Title Increase right shoulder abduction AROM at least 10 deg to improve ability for dressing and overhead reaching for  chores    Baseline 120 deg    Time 3    Period Weeks    Status New    Target Date 12/10/20  PT Long Term Goals - 11/26/20 1307      PT LONG TERM GOAL #1   Title Improve QuickDASH score to 20% or less impairment due to shoulder pain    Baseline 40.91% limited    Time 6    Period Weeks    Status New    Target Date 01/07/21      PT LONG TERM GOAL #2   Title Increase shoulder strength for right shoulder flexion and abduction and bilat. 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 reports limited change in symptoms thus far-given potential symptom etiology and duration to date expect progress will take more time with exercises. Right shoulder primary side for symptoms at this point. Numerical pain rating for right shoulder improved from 8/10 at eval to 4/10 today but overall/otherwise status for therpay goals ongoing. Held supine exercises today per pt. request due to difficulty tolerating position due to hip and back pain so focus sidelying and standing exercises for shoulder. Screened neck and no cordant UE symptoms reproduced so unclear etiology hand symptoms though pt. does have PMH neuropathy.    Personal Factors and Comorbidities Comorbidity 3+    Comorbidities diabetic with  neuropathy, sickle cell trait, chronic pain history    Examination-Activity Limitations Lift;Sleep;Hygiene/Grooming;Dressing;Carry;Reach Overhead;Bathing    Examination-Participation Restrictions Driving;Cleaning    Stability/Clinical Decision Making Evolving/Moderate complexity    Clinical Decision Making Moderate    Rehab Potential Good    PT Frequency 2x / week    PT Duration 6 weeks    PT Treatment/Interventions ADLs/Self Care Home Management;Cryotherapy;Electrical Stimulation;Moist Heat;Iontophoresis 4mg /ml Dexamethasone;Therapeutic exercise;Patient/family education;Dry needling;Manual techniques;Neuromuscular re-education;Therapeutic activities;Taping    PT Next Visit Plan Monitor for any neuro signs/symptoms (see subjective), as tolerated continue progression shoulder strengthening/stabilization, further manual prn, cryo prn    PT Home Exercise Plan Access code: [email or web address]    Consulted and Agree with Plan of Care Patient           Patient will benefit from skilled therapeutic intervention in order to improve the following deficits and impairments:  Pain,Postural dysfunction,Impaired flexibility,Decreased strength,Decreased range of motion,Impaired sensation,Impaired UE functional use  Visit Diagnosis: Chronic right shoulder pain  Stiffness of right shoulder, not elsewhere classified  Chronic left shoulder pain  Muscle weakness (generalized)     Problem List Patient Active Problem List   Diagnosis Date Noted  . History of total knee replacement, left 04/03/2019  . History of total knee arthroplasty, right 04/03/2019  .  DERMATOFIBROMA 10/26/2007  . ANEMIA, CHRONIC 10/26/2007  . SICKLE CELL TRAIT 10/26/2007  . ESSENTIAL HYPERTENSION 10/26/2007  . GASTROESOPHAGEAL REFLUX DISEASE 10/26/2007  . GASTRITIS 10/26/2007  . HIATAL HERNIA 10/26/2007  . CONSTIPATION, CHRONIC 10/26/2007  . GUAIAC POSITIVE STOOL 10/26/2007    Beaulah Dinning, PT, DPT 12/03/20 12:45 PM  [facility] [address] [place], [place], [address] Phone: [phone]   Fax:  [phone]  Name: Taylor Edwards MRN: [id number] Date of Birth: [birth year]

## 2020-12-05 ENCOUNTER — Ambulatory Visit: Payer: BC Managed Care – PPO | Admitting: Physical Therapy

## 2020-12-05 ENCOUNTER — Other Ambulatory Visit: Payer: Self-pay

## 2020-12-05 ENCOUNTER — Encounter: Payer: Self-pay | Admitting: Physical Therapy

## 2020-12-05 DIAGNOSIS — M25512 Pain in left shoulder: Secondary | ICD-10-CM | POA: Diagnosis not present

## 2020-12-05 DIAGNOSIS — M6281 Muscle weakness (generalized): Secondary | ICD-10-CM | POA: Diagnosis not present

## 2020-12-05 DIAGNOSIS — G8929 Other chronic pain: Secondary | ICD-10-CM | POA: Diagnosis not present

## 2020-12-05 DIAGNOSIS — M25511 Pain in right shoulder: Secondary | ICD-10-CM | POA: Diagnosis not present

## 2020-12-05 DIAGNOSIS — M25611 Stiffness of right shoulder, not elsewhere classified: Secondary | ICD-10-CM

## 2020-12-05 NOTE — Therapy (Signed)
[place], [place], [address] Phone: [phone]   Fax:  [phone]  Physical Therapy Treatment  Patient Details  Name: Taylor Edwards MRN: [id number] Date of Birth: 18-Sep-1941 Referring Provider (PT): Lajean Manes, MD   Encounter Date: 12/05/2020   PT End of Session - 12/05/20 1147    Visit Number 3    Number of Visits 12    Date for PT Re-Evaluation 01/07/21    Authorization Type UHC Medicare primary, progress note by visit 10    PT Start Time 1058    PT Stop Time 1145    PT Time Calculation (min) 47 min    Activity Tolerance Patient limited by pain;Patient tolerated treatment well   intermittent pain with right shoulder exercises but overall session well-tolerated   Behavior During Therapy [facility] for tasks assessed/performed           Past Medical History:  Diagnosis Date  . Anemia    no current med.  . Arthritis    knees  . Asthma    no current meds.  . Cataract of both eyes    early  . Diabetes mellitus    only taking natural supplements  . GERD (gastroesophageal reflux disease)    no current med.  . Hypertension    has been on med. x 10 yrs.  . IBS (irritable bowel syndrome)   . Post-nasal drip    current cough  . Sickle cell trait ([place])   . Urinary urgency     Past Surgical History:  Procedure Laterality Date  . ABDOMINAL HYSTERECTOMY    . BUNIONECTOMY  08/25/2011   Procedure: [name];  Surgeon: Hessie Dibble, MD;  Location: [facility];  Service: Orthopedics;  Laterality: Right;  . CATARACT EXTRACTION     both eyes  . COLONOSCOPY W/ BIOPSIES AND POLYPECTOMY    . DILATION AND CURETTAGE OF UTERUS    . INJECTION KNEE  08/25/2011   Procedure: KNEE INJECTION;  Surgeon: Hessie Dibble, MD;  Location: [facility];  Service: Orthopedics;  Laterality: Bilateral;  . JOINT REPLACEMENT    . KNEE ARTHROSCOPY  12/11/2008   bilat., with  chondroplasty  . KNEE JOINT MANIPULATION  10/30/2009   bilat.  . OPEN REDUCTION INTERNAL FIXATION (ORIF) DISTAL RADIAL FRACTURE Left 11/17/2014   Procedure: OPEN REDUCTION INTERNAL FIXATION (ORIF) LEFT DISTAL RADIUS  FRACTURE;  Surgeon: Charlotte Crumb, MD;  Location: [facility];  Service: Orthopedics;  Laterality: Left;  . TOTAL KNEE ARTHROPLASTY  09/26/2009   bilat.    There were no vitals filed for this visit.   Subjective Assessment - 12/05/20 1102    Subjective Pt. reports had "cryo" and cold laser treatments yesterday for shoulder and knees. Shoulder feeling about the same as last visit.    Pertinent History Bilateral TKA, diabetic, HTN, IBS, sickle cell trait              [facility] PT Assessment - 12/05/20 0001      AROM   Right Shoulder Flexion 160 Degrees    Right Shoulder ABduction 140 Degrees                         [facility] Adult PT Treatment/Exercise - 12/05/20 0001      Shoulder Exercises: Supine   Protraction AROM;Strengthening;Both;20 reps    Protraction Weight (lbs) 2    Horizontal ABduction AROM;Strengthening;Both;20 reps    Theraband Level (  Shoulder Horizontal ABduction) Level 2 (Red)    Other Supine Exercises supine rhythmic stabilization right side at 90 deg flexion 20 sec x 3      Shoulder Exercises: Sidelying   External Rotation AROM;Right;20 reps    ABduction Limitations right scaption AROM 2x10      Shoulder Exercises: Standing   External Rotation AROM;Strengthening;Right;20 reps;Left    Theraband Level (Shoulder External Rotation) Level 2 (Red)    Internal Rotation AROM;Strengthening;20 reps;Right;Left    Theraband Level (Shoulder Internal Rotation) Level 3 (Green)    Flexion Limitations alternating Theraband punches red band x 20 ea. bilat.    ABduction Limitations left shoulder scaption 2x10 with 2 lbs.   right scaption performed in sidelying   Extension AROM;Strengthening;Both;20 reps    Theraband Level (Shoulder Extension) Level 3 (Green)     Row AROM;Strengthening;Both;20 reps    Theraband Level (Shoulder Row) Level 3 (Green)    Other Standing Exercises shoulder flexion to 90 deg 2x10 ea. bilat. 1 lb. right 2 lbs. left      Manual Therapy   Joint Mobilization Right shoulder AP mobs and caudal glides grade I-II    Soft tissue mobilization STM right posterior shoulder and middle deltoid region                    PT Short Term Goals - 11/26/20 1306      PT SHORT TERM GOAL #1   Title Independent with initial HEP    Baseline instructed at eval    Time 3    Period Weeks    Status New    Target Date 12/17/20      PT SHORT TERM GOAL #2   Title Increase right shoulder abduction AROM at least 10 deg to improve ability for dressing and overhead reaching for chores    Baseline 120 deg    Time 3    Period Weeks    Status New    Target Date 12/10/20             PT Long Term Goals - 11/26/20 1307      PT LONG TERM GOAL #1   Title Improve QuickDASH score to 20% or less impairment due to shoulder pain    Baseline 40.91% limited    Time 6    Period Weeks    Status New    Target Date 01/07/21      PT LONG TERM GOAL #2   Title Increase shoulder strength for right shoulder flexion and abduction and bilat. 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 showing gains from baseline status with right shoulder flexion and abduction ROM and has had improved tolerance for exercises as well from baseline status allowing progression as noted per flowsheet. Min to mod cues for form with exercises and still with pain in right shoulder abduction and ER>flexion consistent with subacromial pain syndrome. As previously noted expect progress will be gradual given symptom etiology for pain relief.    Personal Factors and Comorbidities Comorbidity 3+    Comorbidities diabetic with neuropathy, sickle cell trait, chronic pain history    Examination-Activity Limitations Lift;Sleep;Hygiene/Grooming;Dressing;Carry;Reach Overhead;Bathing    Examination-Participation Restrictions Driving;Cleaning    Stability/Clinical Decision Making Evolving/Moderate complexity    Clinical Decision Making Moderate    Rehab Potential Good    PT Frequency 2x / week    PT Duration 6 weeks    PT Treatment/Interventions ADLs/Self Care Home Management;Cryotherapy;Electrical Stimulation;Moist Heat;Iontophoresis 4mg /ml Dexamethasone;Therapeutic exercise;Patient/family education;Dry needling;Manual techniques;Neuromuscular re-education;Therapeutic activities;Taping    PT Next Visit Plan as tolerated continue progression shoulder strengthening/stabilization, further manual prn, cryo prn    PT Home Exercise Plan Access code: [email or web address]    Consulted and Agree with Plan of Care Patient           Patient will benefit from skilled therapeutic intervention in order to improve the following deficits and impairments:  Pain,Postural dysfunction,Impaired flexibility,Decreased strength,Decreased range of motion,Impaired sensation,Impaired UE functional use  Visit Diagnosis: Chronic right shoulder pain  Stiffness of right shoulder, not elsewhere classified  Chronic left shoulder pain  Muscle weakness (generalized)     Problem  List Patient Active Problem List   Diagnosis Date Noted  . History of total knee replacement, left 04/03/2019  . History of total knee arthroplasty, right 04/03/2019  . DERMATOFIBROMA 10/26/2007  . ANEMIA, CHRONIC 10/26/2007  . SICKLE CELL TRAIT 10/26/2007  . ESSENTIAL HYPERTENSION 10/26/2007  . GASTROESOPHAGEAL REFLUX DISEASE 10/26/2007  . GASTRITIS 10/26/2007  . HIATAL HERNIA 10/26/2007  . CONSTIPATION, CHRONIC 10/26/2007  . GUAIAC POSITIVE STOOL 10/26/2007    Beaulah Dinning, PT, DPT 12/05/20 12:08 PM  [facility] [address] [place], [place], [address] Phone: [phone]   Fax:  [phone]  Name: Taylor Edwards MRN: [id number] Date of Birth: [birth year]

## 2020-12-10 ENCOUNTER — Ambulatory Visit: Payer: BC Managed Care – PPO | Admitting: Physical Therapy

## 2020-12-10 ENCOUNTER — Other Ambulatory Visit: Payer: Self-pay

## 2020-12-10 ENCOUNTER — Encounter: Payer: Self-pay | Admitting: Physical Therapy

## 2020-12-10 ENCOUNTER — Encounter: Payer: Medicare Other | Admitting: Physical Therapy

## 2020-12-10 VITALS — BP 120/70 | HR 64

## 2020-12-10 DIAGNOSIS — M6281 Muscle weakness (generalized): Secondary | ICD-10-CM | POA: Diagnosis not present

## 2020-12-10 DIAGNOSIS — M25511 Pain in right shoulder: Secondary | ICD-10-CM | POA: Diagnosis not present

## 2020-12-10 DIAGNOSIS — M25611 Stiffness of right shoulder, not elsewhere classified: Secondary | ICD-10-CM | POA: Diagnosis not present

## 2020-12-10 DIAGNOSIS — G8929 Other chronic pain: Secondary | ICD-10-CM

## 2020-12-10 DIAGNOSIS — M25512 Pain in left shoulder: Secondary | ICD-10-CM | POA: Diagnosis not present

## 2020-12-10 NOTE — Therapy (Signed)
[place], [place], [address] Phone: [phone]   Fax:  [phone]  Physical Therapy Treatment  Patient Details  Name: Taylor Edwards MRN: [id number] Date of Birth: 09-07-1941 Referring Provider (PT): Lajean Manes, MD   Encounter Date: 12/10/2020   PT End of Session - 12/10/20 1018    Visit Number 4    Number of Visits 12    Date for PT Re-Evaluation 01/07/21    Authorization Type UHC Medicare primary, progress note by visit 10    PT Start Time 1016    PT Stop Time 1054    PT Time Calculation (min) 38 min    Activity Tolerance Patient limited by pain;Patient tolerated treatment well    Behavior During Therapy [facility] for tasks assessed/performed           Past Medical History:  Diagnosis Date  . Anemia    no current med.  . Arthritis    knees  . Asthma    no current meds.  . Cataract of both eyes    early  . Diabetes mellitus    only taking natural supplements  . GERD (gastroesophageal reflux disease)    no current med.  . Hypertension    has been on med. x 10 yrs.  . IBS (irritable bowel syndrome)   . Post-nasal drip    current cough  . Sickle cell trait ([place])   . Urinary urgency     Past Surgical History:  Procedure Laterality Date  . ABDOMINAL HYSTERECTOMY    . BUNIONECTOMY  08/25/2011   Procedure: [name];  Surgeon: Hessie Dibble, MD;  Location: [place];  Service: Orthopedics;  Laterality: Right;  . CATARACT EXTRACTION     both eyes  . COLONOSCOPY W/ BIOPSIES AND POLYPECTOMY    . DILATION AND CURETTAGE OF UTERUS    . INJECTION KNEE  08/25/2011   Procedure: KNEE INJECTION;  Surgeon: Hessie Dibble, MD;  Location: [place];  Service: Orthopedics;  Laterality: Bilateral;  . JOINT REPLACEMENT    . KNEE ARTHROSCOPY  12/11/2008   bilat., with chondroplasty  . KNEE JOINT MANIPULATION  10/30/2009   bilat.  . OPEN REDUCTION INTERNAL  FIXATION (ORIF) DISTAL RADIAL FRACTURE Left 11/17/2014   Procedure: OPEN REDUCTION INTERNAL FIXATION (ORIF) LEFT DISTAL RADIUS  FRACTURE;  Surgeon: Charlotte Crumb, MD;  Location: [place];  Service: Orthopedics;  Laterality: Left;  . TOTAL KNEE ARTHROPLASTY  09/26/2009   bilat.    Vitals:   12/10/20 1019  BP: 120/70  Pulse: 64  SpO2: 98%     Subjective Assessment - 12/10/20 1019    Subjective " I had just got back from doing cryo and I am feeling tired today. my shoulder has been giving me more issues."    Currently in Pain? Yes    Pain Score 6     Pain Orientation Right    Pain Descriptors / Indicators Aching;Sharp    Pain Type Chronic pain    Aggravating Factors  "mobility" moving it around    Pain Relieving Factors icy hot,                             [facility] Adult PT Treatment/Exercise - 12/10/20 0001      Shoulder Exercises: Seated   Row Both;10 reps;Strengthening;Theraband   x 2 sets   Theraband Level (Shoulder Row) Level 3 (Green)  External Rotation Strengthening;10 reps;Theraband   x 2 sets   Theraband Level (Shoulder External Rotation) Level 3 (Green)    Internal Rotation 10 reps;Theraband   x 2 sets   Theraband Level (Shoulder Internal Rotation) Level 3 (Green)    Flexion Both;15 reps   scaption angle unweighted   Other Seated Exercises lower trap strengtheing 2 x 10 with red theraband   elbows propped bolster     Shoulder Exercises: Stretch   Other Shoulder Stretches upper trap and Levator scaspulae stretch 2 x 30 focusing on the R      Manual Therapy   Manual therapy comments MTPR R upper trap/ levator scapulae, middle deltoid    Soft tissue mobilization IASTM along the R upper trap/ levator scapulae and middle deltoid                    PT Short Term Goals - 11/26/20 1306      PT SHORT TERM GOAL #1   Title Independent with initial HEP    Baseline instructed at eval    Time 3    Period Weeks    Status New    Target Date  12/17/20      PT SHORT TERM GOAL #2   Title Increase right shoulder abduction AROM at least 10 deg to improve ability for dressing and overhead reaching for chores    Baseline 120 deg    Time 3    Period Weeks    Status New    Target Date 12/10/20             PT Long Term Goals - 11/26/20 1307      PT LONG TERM GOAL #1   Title Improve QuickDASH score to 20% or less impairment due to shoulder pain    Baseline 40.91% limited    Time 6    Period Weeks    Status New    Target Date 01/07/21      PT LONG TERM GOAL #2   Title Increase shoulder strength for right shoulder flexion and abduction and bilat. 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 Monitored vitals due to pt being more fatigued today which were WNL. continued STW for the R shoulder musculature which she noted relief of tension and continue strengthening of the posterior shoulder musculature. Performed all exercises today  in seated pos due to report of having a "gout flare in the L great toe.    PT Treatment/Interventions ADLs/Self Care Home Management;Cryotherapy;Electrical Stimulation;Moist Heat;Iontophoresis 4mg /ml  Dexamethasone;Therapeutic exercise;Patient/family education;Dry needling;Manual techniques;Neuromuscular re-education;Therapeutic activities;Taping    PT Next Visit Plan as tolerated continue progression shoulder strengthening/stabilization, further manual prn, cryo prn    Consulted and Agree with Plan of Care Patient           Patient will benefit from skilled therapeutic intervention in order to improve the following deficits and impairments:  Pain,Postural dysfunction,Impaired flexibility,Decreased strength,Decreased range of motion,Impaired sensation,Impaired UE functional use  Visit Diagnosis: Chronic right shoulder pain  Stiffness of right shoulder, not elsewhere classified     Problem List Patient Active Problem List   Diagnosis Date Noted  . History of total knee replacement, left 04/03/2019  . History of total knee arthroplasty, right 04/03/2019  . DERMATOFIBROMA 10/26/2007  . ANEMIA, CHRONIC 10/26/2007  . SICKLE CELL TRAIT 10/26/2007  . ESSENTIAL HYPERTENSION 10/26/2007  . GASTROESOPHAGEAL REFLUX DISEASE 10/26/2007  . GASTRITIS 10/26/2007  . HIATAL HERNIA 10/26/2007  . CONSTIPATION, CHRONIC 10/26/2007  . GUAIAC POSITIVE STOOL 10/26/2007    Starr Lake PT, DPT, LAT, ATC  12/10/20  10:55 AM      [facility] [address] [place], [place], [address] Phone: [phone]   Fax:  [phone]  Name: Taylor Edwards MRN: [id number] Date of Birth: 04-Aug-1941

## 2020-12-12 ENCOUNTER — Ambulatory Visit: Payer: BC Managed Care – PPO | Admitting: Physical Therapy

## 2020-12-12 ENCOUNTER — Encounter: Payer: Medicare Other | Admitting: Physical Therapy

## 2020-12-17 ENCOUNTER — Ambulatory Visit: Payer: BC Managed Care – PPO | Admitting: Physical Therapy

## 2020-12-17 ENCOUNTER — Encounter: Payer: Medicare Other | Admitting: Physical Therapy

## 2020-12-17 ENCOUNTER — Other Ambulatory Visit: Payer: Self-pay

## 2020-12-17 ENCOUNTER — Encounter: Payer: Self-pay | Admitting: Physical Therapy

## 2020-12-17 DIAGNOSIS — M6281 Muscle weakness (generalized): Secondary | ICD-10-CM | POA: Diagnosis not present

## 2020-12-17 DIAGNOSIS — G8929 Other chronic pain: Secondary | ICD-10-CM

## 2020-12-17 DIAGNOSIS — M25611 Stiffness of right shoulder, not elsewhere classified: Secondary | ICD-10-CM | POA: Diagnosis not present

## 2020-12-17 DIAGNOSIS — M25512 Pain in left shoulder: Secondary | ICD-10-CM | POA: Diagnosis not present

## 2020-12-17 DIAGNOSIS — M25511 Pain in right shoulder: Secondary | ICD-10-CM | POA: Diagnosis not present

## 2020-12-17 NOTE — Therapy (Signed)
[place], [place], [address] Phone: [phone]   Fax:  [phone]  Physical Therapy Treatment  Patient Details  Name: Taylor Edwards MRN: [id number] Date of Birth: [birth year] Referring Provider (PT): Lajean Manes, MD   Encounter Date: 12/17/2020   PT End of Session - 12/17/20 1018    Visit Number 5    Number of Visits 12    Date for PT Re-Evaluation 01/07/21    Authorization Type UHC Medicare primary, progress note by visit 10    PT Start Time 1018    PT Stop Time 1056    PT Time Calculation (min) 38 min    Activity Tolerance Patient limited by pain;Patient tolerated treatment well    Behavior During Therapy [facility] for tasks assessed/performed           Past Medical History:  Diagnosis Date  . Anemia    no current med.  . Arthritis    knees  . Asthma    no current meds.  . Cataract of both eyes    early  . Diabetes mellitus    only taking natural supplements  . GERD (gastroesophageal reflux disease)    no current med.  . Hypertension    has been on med. x 10 yrs.  . IBS (irritable bowel syndrome)   . Post-nasal drip    current cough  . Sickle cell trait ([place])   . Urinary urgency     Past Surgical History:  Procedure Laterality Date  . ABDOMINAL HYSTERECTOMY    . BUNIONECTOMY  08/25/2011   Procedure: [name];  Surgeon: Hessie Dibble, MD;  Location: [place];  Service: Orthopedics;  Laterality: Right;  . CATARACT EXTRACTION     both eyes  . COLONOSCOPY W/ BIOPSIES AND POLYPECTOMY    . DILATION AND CURETTAGE OF UTERUS    . INJECTION KNEE  08/25/2011   Procedure: KNEE INJECTION;  Surgeon: Hessie Dibble, MD;  Location: [place];  Service: Orthopedics;  Laterality: Bilateral;  . JOINT REPLACEMENT    . KNEE ARTHROSCOPY  12/11/2008   bilat., with chondroplasty  . KNEE JOINT MANIPULATION  10/30/2009   bilat.  . OPEN REDUCTION INTERNAL  FIXATION (ORIF) DISTAL RADIAL FRACTURE Left 11/17/2014   Procedure: OPEN REDUCTION INTERNAL FIXATION (ORIF) LEFT DISTAL RADIUS  FRACTURE;  Surgeon: Charlotte Crumb, MD;  Location: [place];  Service: Orthopedics;  Laterality: Left;  . TOTAL KNEE ARTHROPLASTY  09/26/2009   bilat.    There were no vitals filed for this visit.   Subjective Assessment - 12/17/20 1022    Subjective "I am feeling better today, but my R shoulder is still bothering me, with reaching back increases it the most."    Currently in Pain? Yes    Pain Score 3    at worst 8/10   Pain Orientation Right    Pain Onset More than a month ago    Pain Frequency Intermittent    Aggravating Factors  "mobility"              [facility] PT Assessment - 12/17/20 0001      Assessment   Medical Diagnosis Bilateral shoulder pain    Referring Provider (PT) Lajean Manes, MD                         [facility] Adult PT Treatment/Exercise - 12/17/20 0001      Shoulder Exercises: Supine  Protraction Right;Strengthening;10 reps   x 3 set, tactile cues for proper form     Shoulder Exercises: Standing   External Rotation AROM;Strengthening;Right;20 reps;Left    Theraband Level (Shoulder External Rotation) Level 2 (Red)    Internal Rotation AROM;Strengthening;20 reps;Right;Left    Theraband Level (Shoulder Internal Rotation) Level 3 (Green)    Flexion Strengthening;10 reps   scaption angle   Extension AROM;Strengthening;Both;20 reps    Theraband Level (Shoulder Extension) Level 3 (Green)    Row [employer];Theraband    Theraband Level (Shoulder Row) Level 3 (Green)    Other Standing Exercises bicep curl standing with 2#, 2 x 10 with focus on eccentric lowering.      Shoulder Exercises: ROM/Strengthening   Nustep L6 x 5 min UE/LE      Manual Therapy   Manual therapy comments MTPR R upper trap/ levator scapulae, middle deltoid, and bicep stretch                    PT Short Term Goals - 11/26/20 1306       PT SHORT TERM GOAL #1   Title Independent with initial HEP    Baseline instructed at eval    Time 3    Period Weeks    Status New    Target Date 12/17/20      PT SHORT TERM GOAL #2   Title Increase right shoulder abduction AROM at least 10 deg to improve ability for dressing and overhead reaching for chores    Baseline 120 deg    Time 3    Period Weeks    Status New    Target Date 12/10/20             PT Long Term Goals - 11/26/20 1307      PT LONG TERM GOAL #1   Title Improve QuickDASH score to 20% or less impairment due to shoulder pain    Baseline 40.91% limited    Time 6    Period Weeks    Status New    Target Date 01/07/21      PT LONG TERM GOAL #2   Title Increase shoulder strength for right shoulder flexion and abduction and bilat. 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 Continued STW along the R upper trap, levator scapulae, middle deltoid and biceps brachii. continued working on  shoulder strengthening with increase in reps to promote endurnace/ stability. she did well with all exercises and reported improvement in pain with reaching backward.    PT  Treatment/Interventions ADLs/Self Care Home Management;Cryotherapy;Electrical Stimulation;Moist Heat;Iontophoresis 4mg /ml Dexamethasone;Therapeutic exercise;Patient/family education;Dry needling;Manual techniques;Neuromuscular re-education;Therapeutic activities;Taping    PT Next Visit Plan as tolerated continue progression shoulder strengthening/stabilization, further manual prn, cryo prn    PT Home Exercise Plan Access code: [email or web address]    Consulted and Agree with Plan of Care Patient           Patient will benefit from skilled therapeutic intervention in order to improve the following deficits and impairments:  Pain,Postural dysfunction,Impaired flexibility,Decreased strength,Decreased range of motion,Impaired sensation,Impaired UE functional use  Visit Diagnosis: Chronic right shoulder pain  Stiffness of right shoulder, not elsewhere classified  Muscle weakness (generalized)     Problem List Patient Active Problem List   Diagnosis Date Noted  . History of total knee replacement, left 04/03/2019  . History of total knee arthroplasty, right 04/03/2019  . DERMATOFIBROMA 10/26/2007  . ANEMIA, CHRONIC 10/26/2007  . SICKLE CELL TRAIT 10/26/2007  . ESSENTIAL HYPERTENSION 10/26/2007  . GASTROESOPHAGEAL REFLUX DISEASE 10/26/2007  . GASTRITIS 10/26/2007  . HIATAL HERNIA 10/26/2007  . CONSTIPATION, CHRONIC 10/26/2007  . GUAIAC POSITIVE STOOL 10/26/2007   Starr Lake PT, DPT, LAT, ATC  12/17/20  10:57 AM      [facility] [address] [place], [place], [address] Phone: [phone]   Fax:  [phone]  Name: Taylor Edwards MRN: [id number] Date of Birth: 1941/01/02

## 2020-12-19 ENCOUNTER — Encounter: Payer: Medicare Other | Admitting: Physical Therapy

## 2020-12-19 ENCOUNTER — Encounter: Payer: Self-pay | Admitting: Physical Therapy

## 2020-12-19 ENCOUNTER — Ambulatory Visit: Payer: BC Managed Care – PPO | Admitting: Physical Therapy

## 2020-12-19 ENCOUNTER — Other Ambulatory Visit: Payer: Self-pay

## 2020-12-19 DIAGNOSIS — M25511 Pain in right shoulder: Secondary | ICD-10-CM | POA: Diagnosis not present

## 2020-12-19 DIAGNOSIS — M6281 Muscle weakness (generalized): Secondary | ICD-10-CM

## 2020-12-19 DIAGNOSIS — M25512 Pain in left shoulder: Secondary | ICD-10-CM | POA: Diagnosis not present

## 2020-12-19 DIAGNOSIS — G8929 Other chronic pain: Secondary | ICD-10-CM | POA: Diagnosis not present

## 2020-12-19 DIAGNOSIS — M25611 Stiffness of right shoulder, not elsewhere classified: Secondary | ICD-10-CM | POA: Diagnosis not present

## 2020-12-19 NOTE — Therapy (Signed)
[place], [place], [address] Phone: [phone]   Fax:  [phone]  Physical Therapy Treatment  Patient Details  Name: Taylor Edwards MRN: [id number] Date of Birth: March 21, 1941 Referring Provider (PT): Lajean Manes, MD   Encounter Date: 12/19/2020   PT End of Session - 12/19/20 1132    Visit Number 6    Number of Visits 12    Date for PT Re-Evaluation 01/07/21    Authorization Type UHC Medicare primary, progress note by visit 10    PT Start Time 1018    PT Stop Time 1057    PT Time Calculation (min) 39 min    Activity Tolerance Patient tolerated treatment well    Behavior During Therapy [facility] for tasks assessed/performed           Past Medical History:  Diagnosis Date  . Anemia    no current med.  . Arthritis    knees  . Asthma    no current meds.  . Cataract of both eyes    early  . Diabetes mellitus    only taking natural supplements  . GERD (gastroesophageal reflux disease)    no current med.  . Hypertension    has been on med. x 10 yrs.  . IBS (irritable bowel syndrome)   . Post-nasal drip    current cough  . Sickle cell trait ([place])   . Urinary urgency     Past Surgical History:  Procedure Laterality Date  . ABDOMINAL HYSTERECTOMY    . BUNIONECTOMY  08/25/2011   Procedure: [name];  Surgeon: Hessie Dibble, MD;  Location: [place];  Service: Orthopedics;  Laterality: Right;  . CATARACT EXTRACTION     both eyes  . COLONOSCOPY W/ BIOPSIES AND POLYPECTOMY    . DILATION AND CURETTAGE OF UTERUS    . INJECTION KNEE  08/25/2011   Procedure: KNEE INJECTION;  Surgeon: Hessie Dibble, MD;  Location: [place];  Service: Orthopedics;  Laterality: Bilateral;  . JOINT REPLACEMENT    . KNEE ARTHROSCOPY  12/11/2008   bilat., with chondroplasty  . KNEE JOINT MANIPULATION  10/30/2009   bilat.  . OPEN REDUCTION INTERNAL FIXATION (ORIF) DISTAL  RADIAL FRACTURE Left 11/17/2014   Procedure: OPEN REDUCTION INTERNAL FIXATION (ORIF) LEFT DISTAL RADIUS  FRACTURE;  Surgeon: Charlotte Crumb, MD;  Location: [place];  Service: Orthopedics;  Laterality: Left;  . TOTAL KNEE ARTHROPLASTY  09/26/2009   bilat.    There were no vitals filed for this visit.   Subjective Assessment - 12/19/20 1024    Subjective Pt. reports had pain radiating pain down RUE distally to thumb earlier this AM and still having some radiating numbness to thumb.    Pertinent History Bilateral TKA, diabetic, HTN, IBS, sickle cell trait              [facility] PT Assessment - 12/19/20 0001      Special Tests   Other special tests Spurling's (-), ULTT A, B, and C (-), mild ease in RUE "tingling" with cervical distraction but no change in "numbness", Hoffman's (-)                         [facility] Adult PT Treatment/Exercise - 12/19/20 0001      Shoulder Exercises: Supine   Protraction AROM;Strengthening;Right;20 reps    Protraction Weight (lbs) 2    Horizontal ABduction AROM;Strengthening;Both;20 reps    Theraband  Level (Shoulder Horizontal ABduction) Level 2 (Red)    External Rotation AROM;Strengthening;Both;20 reps    Theraband Level (Shoulder External Rotation) Level 2 (Red)    Internal Rotation AROM;Strengthening;Right;20 reps    Theraband Level (Shoulder Internal Rotation) Level 2 (Red)    Flexion AROM;Strengthening;Right;20 reps    Shoulder Flexion Weight (lbs) 2    Flexion Limitations short arc "punch"    Other Supine Exercises supine rhythmic stabilization in supine at 90 deg flexion 20 sec x 3      Shoulder Exercises: Sidelying   External Rotation AROM;Strengthening;Right;20 reps    External Rotation Weight (lbs) 2    ABduction AROM;Strengthening;Right;20 reps    ABduction Weight (lbs) 1    ABduction Limitations scaption      Shoulder Exercises: Standing   Extension Limitations Freemotion bilat. cable extension 3 lbs. 2x10    Row Limitations  Freemotion bilat. cable row 7 lbs. 2x10      Shoulder Exercises: ROM/Strengthening   Nustep L4-6 x 5 min UE/LE      Manual Therapy   Joint Mobilization Right GH AP mobs grade I-III    Soft tissue mobilization MTPR right middle deltoid region                  PT Education - 12/19/20 1130    Education Details exercises, MD follow up for UE radiating symptoms and parasthesias if persisting despite therapy efforts    Person(s) Educated Patient    Methods Explanation;Demonstration;Verbal cues;Tactile cues    Comprehension Verbalized understanding;Returned demonstration;Verbal cues required;Tactile cues required            PT Short Term Goals - 11/26/20 1306      PT SHORT TERM GOAL #1   Title Independent with initial HEP    Baseline instructed at eval    Time 3    Period Weeks    Status New    Target Date 12/17/20      PT SHORT TERM GOAL #2   Title Increase right shoulder abduction AROM at least 10 deg to improve ability for dressing and overhead reaching for chores    Baseline 120 deg    Time 3    Period Weeks    Status New    Target Date 12/10/20             PT Long Term Goals - 11/26/20 1307      PT LONG TERM GOAL #1   Title Improve QuickDASH score to 20% or less impairment due to shoulder pain    Baseline 40.91% limited    Time 6    Period Weeks    Status New    Target Date 01/07/21      PT LONG TERM GOAL #2   Title Increase shoulder strength for right shoulder flexion and abduction and bilat. 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 related to her neuropathy vs. referred pain from shoulder but advised to follow up with MD for further assessment of symptoms if persisting. Otherwise for shoulder improving with strength gains from baseline status and status for pain still intermittent/variable with symptom fluctuation.    Personal Factors and Comorbidities Comorbidity 3+    Comorbidities diabetic with neuropathy, sickle cell trait, chronic pain history    Examination-Activity Limitations Lift;Sleep;Hygiene/Grooming;Dressing;Carry;Reach Overhead;Bathing    Examination-Participation Restrictions Driving;Cleaning    Stability/Clinical Decision Making Evolving/Moderate complexity    Clinical Decision Making Moderate    Rehab Potential Good    PT Frequency 2x / week    PT Duration 6 weeks    PT Treatment/Interventions ADLs/Self Care Home Management;Cryotherapy;Electrical Stimulation;Moist Heat;Iontophoresis 4mg /ml Dexamethasone;Therapeutic exercise;Patient/family education;Dry needling;Manual techniques;Neuromuscular re-education;Therapeutic activities;Taping    PT Next Visit Plan as tolerated continue progression shoulder strengthening/stabilization, further manual prn, cryo prn    PT Home Exercise Plan Access code: [email or web address]    Consulted and Agree with Plan of Care Patient           Patient will benefit from skilled therapeutic intervention in order to improve the following deficits and impairments:   Pain,Postural dysfunction,Impaired flexibility,Decreased strength,Decreased range of motion,Impaired sensation,Impaired UE functional use  Visit Diagnosis: Chronic right shoulder pain  Stiffness of right shoulder, not elsewhere classified  Muscle weakness (generalized)  Chronic left shoulder pain     Problem List Patient Active Problem List   Diagnosis Date Noted  . History of total knee replacement, left 04/03/2019  . History of total knee arthroplasty, right 04/03/2019  . DERMATOFIBROMA 10/26/2007  . ANEMIA, CHRONIC 10/26/2007  . SICKLE CELL TRAIT 10/26/2007  . ESSENTIAL HYPERTENSION 10/26/2007  . GASTROESOPHAGEAL REFLUX DISEASE 10/26/2007  . GASTRITIS 10/26/2007  . HIATAL HERNIA 10/26/2007  . CONSTIPATION, CHRONIC 10/26/2007  . GUAIAC POSITIVE STOOL 10/26/2007    Beaulah Dinning, PT, DPT 12/19/20 11:40 AM  [facility] [address] [place], [place], [address] Phone: [phone]   Fax:  [phone]  Name: Taylor Edwards MRN: [id number] Date of Birth: June 23, 1941

## 2020-12-20 DIAGNOSIS — I10 Essential (primary) hypertension: Secondary | ICD-10-CM | POA: Diagnosis not present

## 2020-12-20 DIAGNOSIS — E119 Type 2 diabetes mellitus without complications: Secondary | ICD-10-CM | POA: Diagnosis not present

## 2020-12-24 ENCOUNTER — Ambulatory Visit: Payer: BC Managed Care – PPO | Admitting: Physical Therapy

## 2020-12-24 ENCOUNTER — Other Ambulatory Visit: Payer: Self-pay

## 2020-12-24 ENCOUNTER — Encounter: Payer: Medicare Other | Admitting: Physical Therapy

## 2020-12-26 ENCOUNTER — Ambulatory Visit: Payer: BC Managed Care – PPO | Attending: Geriatric Medicine | Admitting: Physical Therapy

## 2020-12-26 ENCOUNTER — Encounter: Payer: Self-pay | Admitting: Physical Therapy

## 2020-12-26 ENCOUNTER — Encounter: Payer: Medicare Other | Admitting: Physical Therapy

## 2020-12-26 ENCOUNTER — Other Ambulatory Visit: Payer: Self-pay

## 2020-12-26 VITALS — BP 142/70 | HR 74

## 2020-12-26 DIAGNOSIS — M25511 Pain in right shoulder: Secondary | ICD-10-CM | POA: Diagnosis not present

## 2020-12-26 DIAGNOSIS — M6281 Muscle weakness (generalized): Secondary | ICD-10-CM | POA: Insufficient documentation

## 2020-12-26 DIAGNOSIS — M25611 Stiffness of right shoulder, not elsewhere classified: Secondary | ICD-10-CM | POA: Insufficient documentation

## 2020-12-26 DIAGNOSIS — M25512 Pain in left shoulder: Secondary | ICD-10-CM | POA: Insufficient documentation

## 2020-12-26 DIAGNOSIS — G8929 Other chronic pain: Secondary | ICD-10-CM | POA: Diagnosis not present

## 2020-12-26 NOTE — Therapy (Signed)
[place], [place], [address] Phone: [phone]   Fax:  [phone]  Physical Therapy Treatment  Patient Details  Name: Taylor Edwards MRN: [id number] Date of Birth: Jul 30, 1941 Referring Provider (PT): Lajean Manes, MD   Encounter Date: 12/26/2020   PT End of Session - 12/26/20 1117    Visit Number 7    Number of Visits 12    Date for PT Re-Evaluation 01/07/21    Authorization Type UHC Medicare primary, progress note by visit 10    PT Start Time 1018    PT Stop Time 1102    PT Time Calculation (min) 44 min    Activity Tolerance Patient limited by pain    Behavior During Therapy [facility] for tasks assessed/performed           Past Medical History:  Diagnosis Date  . Anemia    no current med.  . Arthritis    knees  . Asthma    no current meds.  . Cataract of both eyes    early  . Diabetes mellitus    only taking natural supplements  . GERD (gastroesophageal reflux disease)    no current med.  . Hypertension    has been on med. x 10 yrs.  . IBS (irritable bowel syndrome)   . Post-nasal drip    current cough  . Sickle cell trait ([place])   . Urinary urgency     Past Surgical History:  Procedure Laterality Date  . ABDOMINAL HYSTERECTOMY    . BUNIONECTOMY  08/25/2011   Procedure: [name];  Surgeon: Hessie Dibble, MD;  Location: [place];  Service: Orthopedics;  Laterality: Right;  . CATARACT EXTRACTION     both eyes  . COLONOSCOPY W/ BIOPSIES AND POLYPECTOMY    . DILATION AND CURETTAGE OF UTERUS    . INJECTION KNEE  08/25/2011   Procedure: KNEE INJECTION;  Surgeon: Hessie Dibble, MD;  Location: [place];  Service: Orthopedics;  Laterality: Bilateral;  . JOINT REPLACEMENT    . KNEE ARTHROSCOPY  12/11/2008   bilat., with chondroplasty  . KNEE JOINT MANIPULATION  10/30/2009   bilat.  . OPEN REDUCTION INTERNAL FIXATION (ORIF) DISTAL RADIAL  FRACTURE Left 11/17/2014   Procedure: OPEN REDUCTION INTERNAL FIXATION (ORIF) LEFT DISTAL RADIUS  FRACTURE;  Surgeon: Charlotte Crumb, MD;  Location: [place];  Service: Orthopedics;  Laterality: Left;  . TOTAL KNEE ARTHROPLASTY  09/26/2009   bilat.    Vitals:   12/26/20 1035  BP: (!) 142/70  Pulse: 74  SpO2: 97%     Subjective Assessment - 12/26/20 1111    Subjective Pt. continuing to note rigth lateral shoulder pain and also notes continued issues with numbness and pain in right thum region. She also reports has been having recent intermittent dizziness symptoms as well. Pt. sees PCP for follow up tomorrow. See assessment/plan.    Pertinent History Bilateral TKA, diabetic, HTN, IBS, sickle cell trait              [facility] PT Assessment - 12/26/20 0001      Special Tests   Other special tests Mild increase in right hand symptoms with Phalen's and Tinel's in right carpal tunnel region, unclear symptoms with carpal compression, (+) Phalen's on right for pain                         [facility] Adult PT Treatment/Exercise - 12/26/20  0001      Shoulder Exercises: Supine   Protraction AROM;Strengthening;Right;20 reps    Protraction Weight (lbs) 2    Horizontal ABduction AROM;Strengthening;Both;20 reps    Theraband Level (Shoulder Horizontal ABduction) Level 2 (Red)    External Rotation AROM;Strengthening;Both    Theraband Level (Shoulder External Rotation) Level 2 (Red)    External Rotation Limitations 3x10    Internal Rotation AROM;Strengthening;Right;20 reps    Theraband Level (Shoulder Internal Rotation) Level 2 (Red)    Flexion AROM;Strengthening;Right;20 reps    Shoulder Flexion Weight (lbs) 2    Flexion Limitations short arc "punch"    Other Supine Exercises supine rhythmic stabilization in supine at 90 deg flexion 20 sec x 3      Shoulder Exercises: Sidelying   External Rotation AROM;Strengthening;Right;20 reps    External Rotation Weight (lbs) 1-2    External  Rotation Limitations started with 2 lbs. for first set but switched to 1 lb. due to pain and difficulty with mechanics    ABduction AROM;Strengthening;Right;20 reps    ABduction Weight (lbs) 1    ABduction Limitations scaption      Shoulder Exercises: Standing   Flexion AROM;Strengthening;Right;20 reps    Flexion Limitations 2x10 started with 3 lbs. but too heavy/difficulty with form so switched to 2 lbs.    Extension Limitations Freemotion bilat. cable extension 3 lbs. 2x10    Row Limitations Freemotion bilat. cable row 7 lbs. 2x10    Other Standing Exercises Theraband "landmine" press green band 2x10      Manual Therapy   Joint Mobilization Right GH AP mobs grade I-III    Soft tissue mobilization MTPR right middle deltoid region and infraspinatus                  PT Education - 12/26/20 1117    Education Details inform MD of dizziness symptoms as well as hand/thumb symptoms at follow up tomorrow    Person(s) Educated Patient    Methods Explanation    Comprehension Verbalized understanding            PT Short Term Goals - 11/26/20 1306      PT SHORT TERM GOAL #1   Title Independent with initial HEP    Baseline instructed at eval    Time 3    Period Weeks    Status New    Target Date 12/17/20      PT SHORT TERM GOAL #2   Title Increase right shoulder abduction AROM at least 10 deg to improve ability for dressing and overhead reaching for chores    Baseline 120 deg    Time 3    Period Weeks    Status New    Target Date 12/10/20             PT Long Term Goals - 11/26/20 1307      PT LONG TERM GOAL #1   Title Improve QuickDASH score to 20% or less impairment due to shoulder pain    Baseline 40.91% limited    Time 6    Period Weeks    Status New    Target Date 01/07/21      PT LONG TERM GOAL #2   Title Increase shoulder strength for right shoulder flexion and abduction and bilat. 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 reports systolic normally runs around 120). Unclear if any association of elevation with symptoms but recommend alert MD at follow up tomorrow. Still unclear etiology hand symptoms with cervical screening tests at recent visits (-). Pt. did have some mild symptom reproduction with clinical tests for carpal tunnel and DeQuervain's so differential diagnosis could include these but regardless also recommend inform MD of symptoms for further assessment. Fair status for shoulder with continued intermittent pain. Mod cues required for form and still limited tolerance/ability standing flexion and abduction with significant compensation with shoulder shrug.    Personal Factors and Comorbidities Comorbidity 3+    Comorbidities diabetic with neuropathy, sickle cell trait, chronic pain history    Examination-Activity Limitations Lift;Sleep;Hygiene/Grooming;Dressing;Carry;Reach Overhead;Bathing    Examination-Participation Restrictions Driving;Cleaning    Stability/Clinical Decision Making Evolving/Moderate complexity    Clinical Decision Making Moderate    Rehab Potential Good    PT Frequency 2x / week    PT Duration 6 weeks    PT Treatment/Interventions ADLs/Self Care Home  Management;Cryotherapy;Electrical Stimulation;Moist Heat;Iontophoresis 4mg /ml Dexamethasone;Therapeutic exercise;Patient/family education;Dry needling;Manual techniques;Neuromuscular re-education;Therapeutic activities;Taping    PT Next Visit Plan Any word from MD visit regardinghand symptoms and dizziness?, as tolerated continue progression shoulder strengthening/stabilization, further manual prn, cryo prn    PT Home Exercise Plan Access code: [email or web address]    Consulted and Agree with Plan of Care Patient           Patient will benefit from skilled therapeutic intervention in order to improve the following deficits and impairments:  Pain,Postural dysfunction,Impaired flexibility,Decreased strength,Decreased range of motion,Impaired sensation,Impaired UE functional use  Visit Diagnosis: Chronic right shoulder pain  Stiffness of right shoulder, not elsewhere classified  Muscle weakness (generalized)  Chronic left shoulder pain     Problem List Patient Active Problem List   Diagnosis Date Noted  . History of total knee replacement, left 04/03/2019  . History of total knee arthroplasty, right 04/03/2019  . DERMATOFIBROMA 10/26/2007  . ANEMIA, CHRONIC 10/26/2007  . SICKLE CELL TRAIT 10/26/2007  . ESSENTIAL HYPERTENSION 10/26/2007  . GASTROESOPHAGEAL REFLUX DISEASE 10/26/2007  . GASTRITIS 10/26/2007  . HIATAL HERNIA 10/26/2007  . CONSTIPATION, CHRONIC 10/26/2007  . GUAIAC POSITIVE STOOL 10/26/2007    Beaulah Dinning, PT, DPT 12/26/20 11:26 AM  [place], [place], [address] Phone: [phone]   Fax:  [phone]  Name: Taylor Edwards MRN: [id number] Date of Birth: 26-Oct-1940

## 2020-12-27 ENCOUNTER — Ambulatory Visit
Admission: RE | Admit: 2020-12-27 | Discharge: 2020-12-27 | Disposition: A | Discharge status: Home / Self Care | Payer: Medicare Other | Source: Ambulatory Visit | Attending: Geriatric Medicine | Admitting: Geriatric Medicine

## 2020-12-27 ENCOUNTER — Other Ambulatory Visit: Payer: Self-pay | Admitting: Geriatric Medicine

## 2020-12-27 DIAGNOSIS — M79672 Pain in left foot: Secondary | ICD-10-CM

## 2021-01-14 DIAGNOSIS — E119 Type 2 diabetes mellitus without complications: Secondary | ICD-10-CM | POA: Diagnosis not present

## 2021-01-24 ENCOUNTER — Other Ambulatory Visit: Payer: Self-pay | Admitting: Geriatric Medicine

## 2021-01-24 DIAGNOSIS — M79671 Pain in right foot: Secondary | ICD-10-CM

## 2021-02-01 ENCOUNTER — Telehealth: Payer: Self-pay | Admitting: Physical Therapy

## 2021-02-01 ENCOUNTER — Ambulatory Visit: Payer: BC Managed Care – PPO | Admitting: Physical Therapy

## 2021-02-01 NOTE — Telephone Encounter (Signed)
Called and spoke with patient regarding missed appointment 02/01/21-she reports had called and left message to cancel appointment on 01/31/21. Patient requests call back from admin staff to reschedule so plan further follow up pending schedule availability.

## 2021-02-05 ENCOUNTER — Other Ambulatory Visit: Payer: BC Managed Care – PPO

## 2021-02-06 DIAGNOSIS — I10 Essential (primary) hypertension: Secondary | ICD-10-CM | POA: Diagnosis not present

## 2021-02-07 ENCOUNTER — Ambulatory Visit: Payer: BC Managed Care – PPO | Admitting: Physical Therapy

## 2021-02-07 ENCOUNTER — Other Ambulatory Visit: Payer: Self-pay

## 2021-02-07 ENCOUNTER — Ambulatory Visit: Payer: BC Managed Care – PPO | Attending: Geriatric Medicine | Admitting: Physical Therapy

## 2021-02-07 ENCOUNTER — Encounter: Payer: Self-pay | Admitting: Physical Therapy

## 2021-02-07 DIAGNOSIS — M25511 Pain in right shoulder: Secondary | ICD-10-CM | POA: Insufficient documentation

## 2021-02-07 DIAGNOSIS — M6281 Muscle weakness (generalized): Secondary | ICD-10-CM | POA: Diagnosis not present

## 2021-02-07 DIAGNOSIS — G8929 Other chronic pain: Secondary | ICD-10-CM | POA: Insufficient documentation

## 2021-02-07 DIAGNOSIS — M25611 Stiffness of right shoulder, not elsewhere classified: Secondary | ICD-10-CM

## 2021-02-07 NOTE — Therapy (Signed)
[place], [place], [address] Phone: [phone]   Fax:  [phone]  Physical Therapy Treatment / Recertification Progress Note Reporting Period 10/30/2020 to 02/07/2021  See note below for Objective Data and Assessment of Progress/Goals.       Patient Details  Name: Taylor Edwards MRN: [id number] Date of Birth: [birth year] Referring Provider (PT): Lajean Manes, MD   Encounter Date: 02/07/2021   PT End of Session - 02/07/21 1102    Visit Number 8    Number of Visits 12    Date for PT Re-Evaluation 03/21/21    Authorization Type UHC Medicare primary, progress note by visit 10    Progress Note Due on Visit 52    PT Start Time 74    PT Stop Time 1130   pt stated she had to leave to call her MD and that she was tired   PT Time Calculation (min) 28 min    Activity Tolerance Patient limited by pain    Behavior During Therapy [facility] for tasks assessed/performed           Past Medical History:  Diagnosis Date  . Anemia    no current med.  . Arthritis    knees  . Asthma    no current meds.  . Cataract of both eyes    early  . Diabetes mellitus    only taking natural supplements  . GERD (gastroesophageal reflux disease)    no current med.  . Hypertension    has been on med. x 10 yrs.  . IBS (irritable bowel syndrome)   . Post-nasal drip    current cough  . Sickle cell trait ([place])   . Urinary urgency     Past Surgical History:  Procedure Laterality Date  . ABDOMINAL HYSTERECTOMY    . BUNIONECTOMY  08/25/2011   Procedure: [name];  Surgeon: Hessie Dibble, MD;  Location: [place];  Service: Orthopedics;  Laterality: Right;  . CATARACT EXTRACTION     both eyes  . COLONOSCOPY W/ BIOPSIES AND POLYPECTOMY    . DILATION AND CURETTAGE OF UTERUS    . INJECTION KNEE  08/25/2011   Procedure: KNEE INJECTION;  Surgeon: Hessie Dibble, MD;  Location: [place];  Service: Orthopedics;  Laterality: Bilateral;  . JOINT REPLACEMENT    . KNEE ARTHROSCOPY  12/11/2008   bilat., with chondroplasty  . KNEE JOINT MANIPULATION  10/30/2009   bilat.  . OPEN REDUCTION INTERNAL FIXATION (ORIF) DISTAL RADIAL FRACTURE Left 11/17/2014   Procedure: OPEN REDUCTION INTERNAL FIXATION (ORIF) LEFT DISTAL RADIUS  FRACTURE;  Surgeon: Charlotte Crumb, MD;  Location: [place];  Service: Orthopedics;  Laterality: Left;  . TOTAL KNEE ARTHROPLASTY  09/26/2009   bilat.    There were no vitals filed for this visit.   Subjective Assessment - 02/07/21 1103    Subjective " My health has gone down since I was last seen, I now have kidney disease.The swelling in my feet is continuing to go up and my arteriers are working as well. I am still having issues with my shoulder but I have alot of issues right."    Diagnostic tests no imaging for shoulder, recent mammogram 11/12/20 (-)    Currently in Pain? Yes    Pain Score 2    last took aleve at 6 am. at worst the pain gets up to 6/10   Pain Location Shoulder    Pain Orientation Right  Pain Descriptors / Indicators --   pinchign   Pain Type Chronic pain    Aggravating Factors  lifting something heavy    Pain Relieving Factors aleve, using different topical oitments.              [facility] PT Assessment - 02/07/21 0001      Assessment   Medical Diagnosis Bilateral shoulder pain    Referring Provider (PT) Merlene Laughter, MD      AROM   Right Shoulder Flexion 131 Degrees    Right Shoulder ABduction 110 Degrees      Strength   Right Shoulder Flexion 4/5    Right Shoulder Extension 4/5    Right Shoulder ABduction 3+/5    Right Shoulder Internal Rotation 5/5    Right Shoulder External Rotation 4+/5    Left Shoulder Flexion 5/5    Left Shoulder Extension 5/5    Left Shoulder ABduction 5/5    Left Shoulder Internal Rotation 5/5    Left Shoulder External Rotation 5/5                         [facility] Adult PT  Treatment/Exercise - 02/07/21 0001      Shoulder Exercises: Seated   Row Strengthening;10 reps;Theraband    Theraband Level (Shoulder Row) Level 2 (Red)    External Rotation Strengthening;10 reps;Theraband    Theraband Level (Shoulder External Rotation) Level 2 (Red)    Internal Rotation Strengthening;Right;10 reps;Theraband    Theraband Level (Shoulder Internal Rotation) Level 2 (Red)                  PT Education - 02/07/21 1132    Education Details Reviewed extensively HEP and discussed being carefull with taking too much aleve and if she is looking for a regiment of medication to talk with her pharmacist for an appropriate plan. Reviewed her POC, and goals, and if no progress is measured in the next round of visits then plan to discharge to her MD.    [place]) Educated Patient    Methods Explanation    Comprehension Verbalized understanding            PT Short Term Goals - 02/07/21 1110      PT SHORT TERM GOAL #1   Title Independent with initial HEP    Baseline reprots she has limited time to exercise    Period Weeks    Status On-going      PT SHORT TERM GOAL #2   Title Increase right shoulder abduction AROM at least 10 deg to improve ability for dressing and overhead reaching for chores    Baseline 110 02/07/2021    Status On-going      PT SHORT TERM GOAL #3   Title -    Baseline -             PT Long Term Goals - 02/07/21 1114      PT LONG TERM GOAL #1   Title Improve QuickDASH score to 20% or less impairment due to shoulder pain    Period Weeks      PT LONG TERM GOAL #2   Title Increase shoulder strength for right shoulder flexion and abduction and bilat. 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 she contineus to report R shoulder pain that continues worsen. She has reduction in shoulder AROM and strength compared to previous measures. She notes doing her exercises 1 or 2 x a week. she stated having other issues since she was last seen including hip pain, kidney disease and vascular insufficency in her feet and reports she needs to be seen by a kidney specialist. I extensively reviewed Her HEP and discussed plan to see her 1 x a week for 4 weeks. If no progress is made in that time frame then the plan will be to refer back to her MD for further assessment and discharge from PT.    PT Frequency 1x / week    PT Duration 4 weeks    PT Treatment/Interventions ADLs/Self Care Home Management;Cryotherapy;Electrical Stimulation;Moist Heat;Iontophoresis 4mg /ml Dexamethasone;Therapeutic exercise;Patient/family education;Dry needling;Manual techniques;Neuromuscular re-education;Therapeutic activities;Taping    PT Next Visit Plan did she see a MD regarding her kidneys,  as tolerated continue progression shoulder strengthening/stabilization, further manual prn, cryo prn    Consulted and Agree with Plan of Care Patient           Patient will benefit from skilled therapeutic intervention in order to improve the following deficits and impairments:  Pain,Postural dysfunction,Impaired flexibility,Decreased strength,Decreased range of motion,Impaired sensation,Impaired UE functional use  Visit Diagnosis: Chronic right shoulder pain  Stiffness of right shoulder, not elsewhere  classified  Muscle weakness (generalized)     Problem List Patient Active Problem List   Diagnosis Date Noted  . History of total knee replacement, left 04/03/2019  . History of total knee arthroplasty, right 04/03/2019  . DERMATOFIBROMA 10/26/2007  . ANEMIA, CHRONIC 10/26/2007  . SICKLE CELL TRAIT 10/26/2007  . ESSENTIAL HYPERTENSION 10/26/2007  . GASTROESOPHAGEAL REFLUX DISEASE 10/26/2007  . GASTRITIS 10/26/2007  . HIATAL HERNIA 10/26/2007  . CONSTIPATION, CHRONIC 10/26/2007  . GUAIAC POSITIVE STOOL 10/26/2007   Starr Lake PT, DPT, LAT, ATC  02/07/21  11:37 AM      [facility] [address] [place], [place], [address] Phone: [phone]   Fax:  [phone]  Name: Miciah Shealy MRN: [id number] Date of Birth: [birth year]

## 2021-02-11 ENCOUNTER — Ambulatory Visit
Admission: RE | Admit: 2021-02-11 | Discharge: 2021-02-11 | Disposition: A | Discharge status: Home / Self Care | Payer: BC Managed Care – PPO | Source: Ambulatory Visit | Attending: Geriatric Medicine | Admitting: Geriatric Medicine

## 2021-02-11 DIAGNOSIS — M79671 Pain in right foot: Secondary | ICD-10-CM

## 2021-02-14 ENCOUNTER — Encounter: Payer: BC Managed Care – PPO | Admitting: Physical Therapy

## 2021-02-19 ENCOUNTER — Other Ambulatory Visit: Payer: BC Managed Care – PPO

## 2021-02-20 DIAGNOSIS — E119 Type 2 diabetes mellitus without complications: Secondary | ICD-10-CM | POA: Diagnosis not present

## 2021-02-27 DIAGNOSIS — H40013 Open angle with borderline findings, low risk, bilateral: Secondary | ICD-10-CM | POA: Diagnosis not present

## 2021-02-27 DIAGNOSIS — H04123 Dry eye syndrome of bilateral lacrimal glands: Secondary | ICD-10-CM | POA: Diagnosis not present

## 2021-02-27 DIAGNOSIS — E119 Type 2 diabetes mellitus without complications: Secondary | ICD-10-CM | POA: Diagnosis not present

## 2021-02-27 DIAGNOSIS — H26491 Other secondary cataract, right eye: Secondary | ICD-10-CM | POA: Diagnosis not present

## 2021-02-28 ENCOUNTER — Encounter: Payer: Self-pay | Admitting: Physical Therapy

## 2021-02-28 ENCOUNTER — Ambulatory Visit: Payer: BC Managed Care – PPO | Attending: Geriatric Medicine | Admitting: Physical Therapy

## 2021-02-28 ENCOUNTER — Other Ambulatory Visit: Payer: Self-pay

## 2021-02-28 DIAGNOSIS — G8929 Other chronic pain: Secondary | ICD-10-CM | POA: Insufficient documentation

## 2021-02-28 DIAGNOSIS — M25511 Pain in right shoulder: Secondary | ICD-10-CM | POA: Diagnosis not present

## 2021-02-28 DIAGNOSIS — M6281 Muscle weakness (generalized): Secondary | ICD-10-CM | POA: Insufficient documentation

## 2021-02-28 DIAGNOSIS — M25611 Stiffness of right shoulder, not elsewhere classified: Secondary | ICD-10-CM | POA: Diagnosis not present

## 2021-02-28 NOTE — Therapy (Addendum)
[place], [place], [address] Phone: [phone]   Fax:  [phone]  Physical Therapy Treatment / Discharge  Patient Details  Name: Taylor Edwards MRN: [id number] Date of Birth: Oct 18, 1940 Referring Provider (PT): Lajean Manes, MD   Encounter Date: 02/28/2021   PT End of Session - 02/28/21 1149     Visit Number 9    Number of Visits 12    Date for PT Re-Evaluation 03/21/21    Authorization Type [employer] Medicare primary,    Progress Note Due on Visit 18    PT Start Time 1146    PT Stop Time 1224    PT Time Calculation (min) 38 min    Activity Tolerance Patient tolerated treatment well;No increased pain    Behavior During Therapy WFL for tasks assessed/performed             Past Medical History:  Diagnosis Date   Anemia    no current med.   Arthritis    knees   Asthma    no current meds.   Cataract of both eyes    early   Diabetes mellitus    only taking natural supplements   GERD (gastroesophageal reflux disease)    no current med.   Hypertension    has been on med. x 10 yrs.   IBS (irritable bowel syndrome)    Post-nasal drip    current cough   Sickle cell trait (HCC)    Urinary urgency     Past Surgical History:  Procedure Laterality Date   ABDOMINAL HYSTERECTOMY     BUNIONECTOMY  08/25/2011   Procedure: [name];  Surgeon: Hessie Dibble, MD;  Location: [facility];  Service: Orthopedics;  Laterality: Right;   CATARACT EXTRACTION     both eyes   COLONOSCOPY W/ BIOPSIES AND POLYPECTOMY     DILATION AND CURETTAGE OF UTERUS     INJECTION KNEE  08/25/2011   Procedure: KNEE INJECTION;  Surgeon: Hessie Dibble, MD;  Location: [facility];  Service: Orthopedics;  Laterality: Bilateral;   JOINT REPLACEMENT     KNEE ARTHROSCOPY  12/11/2008   bilat., with chondroplasty   KNEE JOINT MANIPULATION  10/30/2009   bilat.   OPEN REDUCTION INTERNAL  FIXATION (ORIF) DISTAL RADIAL FRACTURE Left 11/17/2014   Procedure: OPEN REDUCTION INTERNAL FIXATION (ORIF) LEFT DISTAL RADIUS  FRACTURE;  Surgeon: Charlotte Crumb, MD;  Location: [facility];  Service: Orthopedics;  Laterality: Left;   TOTAL KNEE ARTHROPLASTY  09/26/2009   bilat.    There were no vitals filed for this visit.   Subjective Assessment - 02/28/21 1150     Subjective " I am feeling much better today, my body is feeling better. I did get a walk in tub so I think that is really going to help."    Currently in Pain? Yes    Pain Score 6     Pain Location Shoulder    Pain Orientation Right    Pain Descriptors / Indicators Aching    Pain Type Chronic pain    Pain Onset More than a month ago    Pain Frequency Intermittent    Aggravating Factors  doing too much at home    Pain Relieving Factors medication, topical ointments    Pain Score 6    Pain Location Back    Pain Orientation Lower;Mid    Pain Descriptors / Indicators Aching;Sore    Pain Type Chronic pain  Pain Onset More than a month ago    Pain Frequency Intermittent    Aggravating Factors  standing/ walking    Pain Relieving Factors medication                [facility] PT Assessment - 02/28/21 0001       Assessment   Medical Diagnosis Bilateral shoulder pain    Referring Provider (PT) Lajean Manes, MD                           [facility] Adult PT Treatment/Exercise - 02/28/21 0001       Lumbar Exercises: Aerobic   Nustep L5 x 5 min UE/LE      Shoulder Exercises: Standing   Protraction Strengthening;Theraband;12 reps   x 2 sets   Theraband Level (Shoulder Protraction) Level 2 (Red)    Horizontal ABduction Strengthening;12 reps;Theraband;Both    Theraband Level (Shoulder Horizontal ABduction) Level 2 (Red)    External Rotation Strengthening;12 reps;Theraband    Theraband Level (Shoulder External Rotation) Level 3 (Green)    Internal Rotation Strengthening;12 reps;Theraband   x 2 sets    Theraband Level (Shoulder Internal Rotation) Level 3 (Green)    Flexion Strengthening;Both;12 reps;Weights   scaption angle   Shoulder Flexion Weight (lbs) 2    Extension AROM;Strengthening;Both;12 reps   x 2 sets   Theraband Level (Shoulder Extension) Level 3 (Green)    Row Strengthening;Both;12 reps;Theraband    Theraband Level (Shoulder Row) Level 3 (Green)   x 2 sets   Other Standing Exercises bicep curl 2 x 12 with red theraband and tricep extens    Other Standing Exercises double ER with scapular retraction 2 x 12 GTB      Wrist Exercises   Other wrist exercises wrist flexion / extensoin 1 x 20 ea. with 2#                    PT Education - 02/28/21 1220     Education Details reviewed HEP              PT Short Term Goals - 02/07/21 1110       PT SHORT TERM GOAL #1   Title Independent with initial HEP    Baseline reprots she has limited time to exercise    Period Weeks    Status On-going      PT SHORT TERM GOAL #2   Title Increase right shoulder abduction AROM at least 10 deg to improve ability for dressing and overhead reaching for chores    Baseline 110 02/07/2021    Status On-going      PT SHORT TERM GOAL #3   Title -    Baseline -               PT Long Term Goals - 02/07/21 1114       PT LONG TERM GOAL #1   Title Improve QuickDASH score to 20% or less impairment due to shoulder pain    Period Weeks      PT LONG TERM GOAL #2   Title Increase shoulder strength for right shoulder flexion and abduction and bilat. 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 she does continue to report R shoulder pain that is rated today at 6/10, as well as low back which is rated at the same. due to report of feeling better focused session primarily on RUE strengthening which she did well with but required verbal cues for proper form. Since she is doing better overall plane to reassess next session and determine if  more PT is needed.    PT Treatment/Interventions ADLs/Self Care Home Management;Cryotherapy;Electrical Stimulation;Moist Heat;Iontophoresis 4mg /ml Dexamethasone;Therapeutic exercise;Patient/family education;Dry needling;Manual techniques;Neuromuscular re-education;Therapeutic activities;Taping    PT Next Visit Plan did she see a MD regarding her kidneys,  as tolerated continue progression shoulder strengthening/stabilization, further manual prn, cryo prn    Consulted and Agree with Plan of Care Patient             Patient will benefit from skilled therapeutic intervention in order to improve the following deficits and impairments:  Pain, Postural dysfunction, Impaired flexibility, Decreased strength, Decreased range of motion, Impaired sensation, Impaired UE functional use  Visit Diagnosis: Chronic right shoulder pain  Stiffness of right shoulder, not elsewhere classified  Muscle weakness (generalized)     Problem List Patient Active Problem List   Diagnosis Date Noted   History of total knee replacement, left 04/03/2019   History of total knee arthroplasty, right 04/03/2019   DERMATOFIBROMA 10/26/2007   ANEMIA, CHRONIC 10/26/2007   SICKLE CELL TRAIT 10/26/2007   ESSENTIAL HYPERTENSION 10/26/2007   GASTROESOPHAGEAL REFLUX  DISEASE 10/26/2007   GASTRITIS 10/26/2007   HIATAL HERNIA 10/26/2007   CONSTIPATION, CHRONIC 10/26/2007   GUAIAC POSITIVE STOOL 10/26/2007   Starr Lake PT, DPT, LAT, ATC  02/28/21  12:24 PM      [facility] [address] [place], [place], [address] Phone: [phone]   Fax:  [phone]  Name: Taylor Edwards MRN: [id number] Date of Birth: 1940-10-19    PHYSICAL THERAPY DISCHARGE SUMMARY  Visits from Start of Care: 9  Current functional level related to goals / functional outcomes: See goals   Remaining deficits: Current status unknown   Education / Equipment: HEP   Patient agrees to discharge. Patient goals were partially met. Patient is being discharged due to not returning since the last visit. Lajarvis Italiano PT, DPT, LAT, ATC  04/24/21  11:15 AM

## 2021-03-04 ENCOUNTER — Ambulatory Visit: Payer: BC Managed Care – PPO | Admitting: Physical Therapy

## 2021-03-07 DIAGNOSIS — M21612 Bunion of left foot: Secondary | ICD-10-CM | POA: Diagnosis not present

## 2021-04-14 DIAGNOSIS — M21612 Bunion of left foot: Secondary | ICD-10-CM | POA: Diagnosis not present

## 2021-06-04 DIAGNOSIS — L818 Other specified disorders of pigmentation: Secondary | ICD-10-CM | POA: Diagnosis not present

## 2021-06-04 DIAGNOSIS — L821 Other seborrheic keratosis: Secondary | ICD-10-CM | POA: Diagnosis not present

## 2021-06-06 DIAGNOSIS — R350 Frequency of micturition: Secondary | ICD-10-CM | POA: Diagnosis not present

## 2021-06-06 DIAGNOSIS — R3915 Urgency of urination: Secondary | ICD-10-CM | POA: Diagnosis not present

## 2021-06-06 DIAGNOSIS — N3281 Overactive bladder: Secondary | ICD-10-CM | POA: Diagnosis not present

## 2021-06-16 DIAGNOSIS — M62838 Other muscle spasm: Secondary | ICD-10-CM | POA: Diagnosis not present

## 2021-06-16 DIAGNOSIS — R3915 Urgency of urination: Secondary | ICD-10-CM | POA: Diagnosis not present

## 2021-06-16 DIAGNOSIS — N3281 Overactive bladder: Secondary | ICD-10-CM | POA: Diagnosis not present

## 2021-06-16 DIAGNOSIS — M6281 Muscle weakness (generalized): Secondary | ICD-10-CM | POA: Diagnosis not present

## 2021-06-16 DIAGNOSIS — M6289 Other specified disorders of muscle: Secondary | ICD-10-CM | POA: Diagnosis not present

## 2021-06-16 DIAGNOSIS — R350 Frequency of micturition: Secondary | ICD-10-CM | POA: Diagnosis not present

## 2021-06-17 DIAGNOSIS — E119 Type 2 diabetes mellitus without complications: Secondary | ICD-10-CM | POA: Diagnosis not present

## 2021-06-26 DIAGNOSIS — M6289 Other specified disorders of muscle: Secondary | ICD-10-CM | POA: Diagnosis not present

## 2021-06-26 DIAGNOSIS — R3915 Urgency of urination: Secondary | ICD-10-CM | POA: Diagnosis not present

## 2021-06-26 DIAGNOSIS — M6281 Muscle weakness (generalized): Secondary | ICD-10-CM | POA: Diagnosis not present

## 2021-06-26 DIAGNOSIS — M62838 Other muscle spasm: Secondary | ICD-10-CM | POA: Diagnosis not present

## 2021-07-21 DIAGNOSIS — M6281 Muscle weakness (generalized): Secondary | ICD-10-CM | POA: Diagnosis not present

## 2021-07-21 DIAGNOSIS — R3915 Urgency of urination: Secondary | ICD-10-CM | POA: Diagnosis not present

## 2021-07-21 DIAGNOSIS — M6289 Other specified disorders of muscle: Secondary | ICD-10-CM | POA: Diagnosis not present

## 2021-07-21 DIAGNOSIS — M62838 Other muscle spasm: Secondary | ICD-10-CM | POA: Diagnosis not present

## 2021-07-21 DIAGNOSIS — R350 Frequency of micturition: Secondary | ICD-10-CM | POA: Diagnosis not present

## 2021-07-23 DIAGNOSIS — I10 Essential (primary) hypertension: Secondary | ICD-10-CM | POA: Diagnosis not present

## 2021-10-03 DIAGNOSIS — H04123 Dry eye syndrome of bilateral lacrimal glands: Secondary | ICD-10-CM | POA: Diagnosis not present

## 2021-10-06 DIAGNOSIS — E119 Type 2 diabetes mellitus without complications: Secondary | ICD-10-CM | POA: Diagnosis not present

## 2021-10-13 ENCOUNTER — Other Ambulatory Visit: Payer: Self-pay | Admitting: Geriatric Medicine

## 2021-10-13 DIAGNOSIS — Z1231 Encounter for screening mammogram for malignant neoplasm of breast: Secondary | ICD-10-CM

## 2021-10-17 DIAGNOSIS — R3915 Urgency of urination: Secondary | ICD-10-CM | POA: Diagnosis not present

## 2021-10-17 DIAGNOSIS — N3281 Overactive bladder: Secondary | ICD-10-CM | POA: Diagnosis not present

## 2021-10-17 DIAGNOSIS — M6281 Muscle weakness (generalized): Secondary | ICD-10-CM | POA: Diagnosis not present

## 2021-10-17 DIAGNOSIS — M62838 Other muscle spasm: Secondary | ICD-10-CM | POA: Diagnosis not present

## 2021-10-17 DIAGNOSIS — M6289 Other specified disorders of muscle: Secondary | ICD-10-CM | POA: Diagnosis not present

## 2021-10-22 ENCOUNTER — Ambulatory Visit: Payer: BC Managed Care – PPO

## 2021-10-27 DIAGNOSIS — I10 Essential (primary) hypertension: Secondary | ICD-10-CM | POA: Diagnosis not present

## 2021-10-28 ENCOUNTER — Ambulatory Visit: Payer: BC Managed Care – PPO

## 2021-11-11 DIAGNOSIS — R3915 Urgency of urination: Secondary | ICD-10-CM | POA: Diagnosis not present

## 2021-11-11 DIAGNOSIS — R350 Frequency of micturition: Secondary | ICD-10-CM | POA: Diagnosis not present

## 2021-11-14 ENCOUNTER — Other Ambulatory Visit: Payer: Self-pay

## 2021-11-14 ENCOUNTER — Ambulatory Visit
Admission: RE | Admit: 2021-11-14 | Discharge: 2021-11-14 | Disposition: A | Discharge status: Home / Self Care | Payer: BC Managed Care – PPO | Source: Ambulatory Visit | Attending: Geriatric Medicine | Admitting: Geriatric Medicine

## 2021-11-14 DIAGNOSIS — Z1231 Encounter for screening mammogram for malignant neoplasm of breast: Secondary | ICD-10-CM

## 2021-11-25 DIAGNOSIS — R350 Frequency of micturition: Secondary | ICD-10-CM | POA: Diagnosis not present

## 2021-12-02 DIAGNOSIS — R350 Frequency of micturition: Secondary | ICD-10-CM | POA: Diagnosis not present

## 2021-12-09 DIAGNOSIS — R350 Frequency of micturition: Secondary | ICD-10-CM | POA: Diagnosis not present

## 2021-12-16 DIAGNOSIS — R350 Frequency of micturition: Secondary | ICD-10-CM | POA: Diagnosis not present

## 2021-12-23 DIAGNOSIS — R3915 Urgency of urination: Secondary | ICD-10-CM | POA: Diagnosis not present

## 2021-12-23 DIAGNOSIS — R350 Frequency of micturition: Secondary | ICD-10-CM | POA: Diagnosis not present

## 2022-01-06 DIAGNOSIS — R350 Frequency of micturition: Secondary | ICD-10-CM | POA: Diagnosis not present

## 2022-01-13 DIAGNOSIS — R350 Frequency of micturition: Secondary | ICD-10-CM | POA: Diagnosis not present

## 2022-01-13 DIAGNOSIS — R3915 Urgency of urination: Secondary | ICD-10-CM | POA: Diagnosis not present

## 2022-01-20 DIAGNOSIS — R350 Frequency of micturition: Secondary | ICD-10-CM | POA: Diagnosis not present

## 2022-01-27 DIAGNOSIS — R350 Frequency of micturition: Secondary | ICD-10-CM | POA: Diagnosis not present

## 2022-02-03 DIAGNOSIS — R350 Frequency of micturition: Secondary | ICD-10-CM | POA: Diagnosis not present

## 2022-02-10 DIAGNOSIS — R3915 Urgency of urination: Secondary | ICD-10-CM | POA: Diagnosis not present

## 2022-02-10 DIAGNOSIS — R350 Frequency of micturition: Secondary | ICD-10-CM | POA: Diagnosis not present

## 2022-02-18 DIAGNOSIS — M21612 Bunion of left foot: Secondary | ICD-10-CM | POA: Diagnosis not present

## 2022-02-18 DIAGNOSIS — M7661 Achilles tendinitis, right leg: Secondary | ICD-10-CM | POA: Diagnosis not present

## 2022-02-18 DIAGNOSIS — M79672 Pain in left foot: Secondary | ICD-10-CM | POA: Diagnosis not present

## 2022-03-03 ENCOUNTER — Ambulatory Visit: Payer: BC Managed Care – PPO | Attending: Orthopedic Surgery

## 2022-03-03 NOTE — Therapy (Incomplete)
OUTPATIENT PHYSICAL THERAPY LOWER EXTREMITY EVALUATION   Patient Name: Taylor Edwards MRN: [id number] DOB:1941/08/31, 81 y.o., female Today's Date: 03/03/2022    Past Medical History:  Diagnosis Date   Anemia    no current med.   Arthritis    knees   Asthma    no current meds.   Cataract of both eyes    early   Diabetes mellitus    only taking natural supplements   GERD (gastroesophageal reflux disease)    no current med.   Hypertension    has been on med. x 10 yrs.   IBS (irritable bowel syndrome)    Post-nasal drip    current cough   Sickle cell trait (HCC)    Urinary urgency    Past Surgical History:  Procedure Laterality Date   ABDOMINAL HYSTERECTOMY     BUNIONECTOMY  08/25/2011   Procedure: [name];  Surgeon: Hessie Dibble, MD;  Location: [facility];  Service: Orthopedics;  Laterality: Right;   CATARACT EXTRACTION     both eyes   COLONOSCOPY W/ BIOPSIES AND POLYPECTOMY     DILATION AND CURETTAGE OF UTERUS     INJECTION KNEE  08/25/2011   Procedure: KNEE INJECTION;  Surgeon: Hessie Dibble, MD;  Location: [facility];  Service: Orthopedics;  Laterality: Bilateral;   JOINT REPLACEMENT     KNEE ARTHROSCOPY  12/11/2008   bilat., with chondroplasty   KNEE JOINT MANIPULATION  10/30/2009   bilat.   OPEN REDUCTION INTERNAL FIXATION (ORIF) DISTAL RADIAL FRACTURE Left 11/17/2014   Procedure: OPEN REDUCTION INTERNAL FIXATION (ORIF) LEFT DISTAL RADIUS  FRACTURE;  Surgeon: Charlotte Crumb, MD;  Location: [place];  Service: Orthopedics;  Laterality: Left;   TOTAL KNEE ARTHROPLASTY  09/26/2009   bilat.   Patient Active Problem List   Diagnosis Date Noted   History of total knee replacement, left 04/03/2019   History of total knee arthroplasty, right 04/03/2019   DERMATOFIBROMA 10/26/2007   ANEMIA, CHRONIC 10/26/2007   SICKLE CELL TRAIT 10/26/2007   ESSENTIAL HYPERTENSION 10/26/2007   GASTROESOPHAGEAL REFLUX DISEASE 10/26/2007    GASTRITIS 10/26/2007   HIATAL HERNIA 10/26/2007   CONSTIPATION, CHRONIC 10/26/2007   GUAIAC POSITIVE STOOL 10/26/2007    PCP: Lajean Manes, MD  REFERRING PROVIDER: Wylene Simmer, MD  REFERRING DIAG:  M76.61 (ICD-10-CM) - Achilles tendinitis, right leg  THERAPY DIAG:  No diagnosis found.  Rationale for Evaluation and Treatment {HABREHAB:27488}  ONSET DATE: ***  SUBJECTIVE:   SUBJECTIVE STATEMENT: ***  PERTINENT HISTORY: ***  PAIN:  Are you having pain? {OPRCPAIN:27236}  PRECAUTIONS: {Therapy precautions:24002}  WEIGHT BEARING RESTRICTIONS {Yes ***/No:24003}  FALLS:  Has patient fallen in last 6 months? {fallsyesno:27318}  LIVING ENVIRONMENT: Lives with: {[facility] lives with:25569::"lives with their family"} Lives in: {Lives in:25570} Stairs: {opstairs:27293} Has following equipment at home: {Assistive devices:23999}  OCCUPATION: ***  PLOF: {PLOF:24004}  PATIENT GOALS ***   OBJECTIVE:   DIAGNOSTIC FINDINGS: ***  PATIENT SURVEYS:  {rehab surveys:24030}  COGNITION:  Overall cognitive status: {cognition:24006}     SENSATION: {sensation:27233}  EDEMA:  {edema:24020}  MUSCLE LENGTH: Hamstrings: Right *** deg; Left *** deg Thomas test: Right *** deg; Left *** deg  POSTURE: {posture:25561}  PALPATION: ***  LOWER EXTREMITY ROM:  {AROM/PROM:27142} ROM Right eval Left eval  Hip flexion    Hip extension    Hip abduction    Hip adduction    Hip internal rotation    Hip external rotation    Knee flexion  Knee extension    Ankle dorsiflexion    Ankle plantarflexion    Ankle inversion    Ankle eversion     (Blank rows = not tested)  LOWER EXTREMITY MMT:  MMT Right eval Left eval  Hip flexion    Hip extension    Hip abduction    Hip adduction    Hip internal rotation    Hip external rotation    Knee flexion    Knee extension    Ankle dorsiflexion    Ankle plantarflexion    Ankle inversion    Ankle eversion     (Blank rows =  not tested)  LOWER EXTREMITY SPECIAL TESTS:  {LEspecialtests:26242}  FUNCTIONAL TESTS:  {Functional tests:24029}  GAIT: Distance walked: *** Assistive device utilized: {Assistive devices:23999} Level of assistance: {Levels of assistance:24026} Comments: ***    TODAY'S TREATMENT: ***   PATIENT EDUCATION:  Education details: *** Person educated: {Person educated:25204} Education method: {Education Method:25205} Education comprehension: {Education Comprehension:25206}   HOME EXERCISE PROGRAM: ***  ASSESSMENT:  CLINICAL IMPRESSION: Patient is a *** y.o. *** who was seen today for physical therapy evaluation and treatment for ***.    OBJECTIVE IMPAIRMENTS {opptimpairments:25111}.   ACTIVITY LIMITATIONS {activitylimitations:27494}  PARTICIPATION LIMITATIONS: {participationrestrictions:25113}  PERSONAL FACTORS {Personal factors:25162} are also affecting patient's functional outcome.   REHAB POTENTIAL: {rehabpotential:25112}  CLINICAL DECISION MAKING: {clinical decision making:25114}  EVALUATION COMPLEXITY: {Evaluation complexity:25115}   GOALS: Goals reviewed with patient? {yes/no:20286}  SHORT TERM GOALS: Target date: {follow up:25551}  *** Baseline: Goal status: {GOALSTATUS:25110}  2.  *** Baseline:  Goal status: {GOALSTATUS:25110}  3.  *** Baseline:  Goal status: {GOALSTATUS:25110}  4.  *** Baseline:  Goal status: {GOALSTATUS:25110}  5.  *** Baseline:  Goal status: {GOALSTATUS:25110}  6.  *** Baseline:  Goal status: {GOALSTATUS:25110}  LONG TERM GOALS: Target date: {follow up:25551}   *** Baseline:  Goal status: {GOALSTATUS:25110}  2.  *** Baseline:  Goal status: {GOALSTATUS:25110}  3.  *** Baseline:  Goal status: {GOALSTATUS:25110}  4.  *** Baseline:  Goal status: {GOALSTATUS:25110}  5.  *** Baseline:  Goal status: {GOALSTATUS:25110}  6.  *** Baseline:  Goal status: {GOALSTATUS:25110}   PLAN: PT FREQUENCY: {rehab  frequency:25116}  PT DURATION: {rehab duration:25117}  PLANNED INTERVENTIONS: {rehab planned interventions:25118::"Therapeutic exercises","Therapeutic activity","Neuromuscular re-education","Balance training","Gait training","Patient/Family education","Joint mobilization"}  PLAN FOR NEXT SESSION: ***   Ward Chatters, PT 03/03/2022, 9:35 AM

## 2022-03-05 DIAGNOSIS — M25562 Pain in left knee: Secondary | ICD-10-CM | POA: Diagnosis not present

## 2022-03-05 DIAGNOSIS — M25561 Pain in right knee: Secondary | ICD-10-CM | POA: Diagnosis not present

## 2022-03-05 DIAGNOSIS — M79672 Pain in left foot: Secondary | ICD-10-CM | POA: Diagnosis not present

## 2022-03-18 ENCOUNTER — Encounter (INDEPENDENT_AMBULATORY_CARE_PROVIDER_SITE_OTHER): Payer: Self-pay

## 2022-03-18 DIAGNOSIS — M21612 Bunion of left foot: Secondary | ICD-10-CM | POA: Diagnosis not present

## 2022-03-26 ENCOUNTER — Encounter (INDEPENDENT_AMBULATORY_CARE_PROVIDER_SITE_OTHER): Payer: Self-pay | Admitting: Ophthalmology

## 2022-03-26 ENCOUNTER — Ambulatory Visit (INDEPENDENT_AMBULATORY_CARE_PROVIDER_SITE_OTHER): Payer: BC Managed Care – PPO | Admitting: Ophthalmology

## 2022-03-26 ENCOUNTER — Encounter: Payer: Self-pay | Admitting: Physical Therapy

## 2022-03-26 ENCOUNTER — Other Ambulatory Visit: Payer: Self-pay

## 2022-03-26 ENCOUNTER — Ambulatory Visit: Payer: BC Managed Care – PPO | Attending: Orthopedic Surgery | Admitting: Physical Therapy

## 2022-03-26 DIAGNOSIS — Z961 Presence of intraocular lens: Secondary | ICD-10-CM

## 2022-03-26 DIAGNOSIS — H43813 Vitreous degeneration, bilateral: Secondary | ICD-10-CM

## 2022-03-26 DIAGNOSIS — M6281 Muscle weakness (generalized): Secondary | ICD-10-CM | POA: Insufficient documentation

## 2022-03-26 DIAGNOSIS — M25571 Pain in right ankle and joints of right foot: Secondary | ICD-10-CM | POA: Diagnosis not present

## 2022-03-26 DIAGNOSIS — R2689 Other abnormalities of gait and mobility: Secondary | ICD-10-CM | POA: Diagnosis not present

## 2022-03-26 DIAGNOSIS — E119 Type 2 diabetes mellitus without complications: Secondary | ICD-10-CM | POA: Diagnosis not present

## 2022-03-26 NOTE — Assessment & Plan Note (Signed)
OU stable

## 2022-03-26 NOTE — Progress Notes (Signed)
03/26/2022     CHIEF COMPLAINT Patient presents for  Chief Complaint  Patient presents with   Retina Evaluation      HISTORY OF PRESENT ILLNESS: Taylor Edwards is a 81 y.o. female who presents to the clinic today for:   HPI     Retina Evaluation           Laterality: both eyes   Associated Symptoms: Negative for Flashes, Floaters, Distortion, Blind Spot, Pain, Redness, Photophobia, Glare, Trauma, Scalp Tenderness, Jaw Claudication, Shoulder/Hip pain, Fever, Weight Loss and Fatigue         Comments   NP- Diabetic eye exam, ref by Dr. Felipa Eth. Pt stated, "Vision is stable. My only concern would be for dry eyes." Pt last A1C was 6.8. pt is now taking jardiance for about a year now.        Last edited by Silvestre Moment on 03/26/2022  1:23 PM.      Referring physician: Lajean Manes, [place]. [address] [place],  [place] [address]  HISTORICAL INFORMATION:   Selected notes from the MEDICAL RECORD NUMBER    Lab Results  Component Value Date   HGBA1C (H) 10/31/2009    6.3 (NOTE) The ADA recommends the following therapeutic goal for glycemic control related to Hgb A1c measurement: Goal of therapy: <6.5 Hgb A1c  Reference: American Diabetes Association: Clinical Practice Recommendations 2010, Diabetes Care, 2010, 33: (Suppl  1).     CURRENT MEDICATIONS: No current outpatient medications on file. (Ophthalmic Drugs)   No current facility-administered medications for this visit. (Ophthalmic Drugs)   Current Outpatient Medications (Other)  Medication Sig   acetaminophen-codeine (TYLENOL #3) 300-30 MG tablet Take 1-2 tablets by mouth every 8 (eight) hours as needed for moderate pain. (Patient not taking: Reported on 11/26/2020)   Ascorbic Acid (VITAMIN C) 100 MG tablet Take 100 mg by mouth daily.   atorvastatin (LIPITOR) 10 MG tablet Take 10 mg by mouth daily.   busPIRone (BUSPAR) 10 MG tablet Take 10 mg by mouth 3 (three) times daily. (Patient not taking:  Reported on 11/26/2020)   docusate sodium (COLACE) 100 MG capsule Take 200 mg by mouth at bedtime.   Garlic 829 MG CAPS Take by mouth daily.   hydrochlorothiazide (HYDRODIURIL) 25 MG tablet Take 25 mg by mouth daily. AM   lisinopril (PRINIVIL,ZESTRIL) 10 MG tablet Take 10 mg by mouth daily. AM   meloxicam (MOBIC) 15 MG tablet Take 1 tablet (15 mg total) by mouth daily as needed for pain. (Patient not taking: Reported on 11/26/2020)   metFORMIN (GLUCOPHAGE) 500 MG tablet Take 500 mg by mouth 2 (two) times daily with a meal.   multivitamin (THERAGRAN) per tablet Take 1 tablet by mouth daily.   oxyCODONE-acetaminophen (PERCOCET/ROXICET) 5-325 MG per tablet Take 1 tablet by mouth every 6 (six) hours as needed. For pain. (Patient not taking: Reported on 11/26/2020)   oxyCODONE-acetaminophen (ROXICET) 5-325 MG per tablet Take 1 tablet by mouth every 4 (four) hours as needed for severe pain. (Patient not taking: Reported on 11/26/2020)   Probiotic Product (PROBIOTIC DAILY PO) Take 2 tablets by mouth at bedtime. (Patient not taking: Reported on 11/26/2020)   Sennosides-Docusate Sodium (SENOKOT S PO) Take 2 tablets by mouth at bedtime.    UNABLE TO FIND Take 2 tablets by mouth 2 (two) times daily. Jerene Canny (herbal supplement for urination) (Patient not taking: Reported on 11/26/2020)   No current facility-administered medications for this visit. (Other)  REVIEW OF SYSTEMS: ROS   Negative for: Constitutional, Gastrointestinal, Neurological, Skin, Genitourinary, Musculoskeletal, HENT, Endocrine, Cardiovascular, Eyes, Respiratory, Psychiatric, Allergic/Imm, Heme/Lymph Last edited by Silvestre Moment on 03/26/2022  1:23 PM.       ALLERGIES Allergies  Allergen Reactions   Morphine And Related Other (See Comments)    Burns skin, hair falls out   Anaprox [Naproxen Sodium] Other (See Comments)    Leg cramps   Aspirin     nausea   Sulfa Antibiotics Other (See Comments)    Leg cramps    PAST MEDICAL HISTORY Past  Medical History:  Diagnosis Date   Anemia    no current med.   Arthritis    knees   Asthma    no current meds.   Cataract of both eyes    early   Diabetes mellitus    only taking natural supplements   GERD (gastroesophageal reflux disease)    no current med.   Hypertension    has been on med. x 10 yrs.   IBS (irritable bowel syndrome)    Post-nasal drip    current cough   Sickle cell trait (HCC)    Urinary urgency    Past Surgical History:  Procedure Laterality Date   ABDOMINAL HYSTERECTOMY     BUNIONECTOMY  08/25/2011   Procedure: [name];  Surgeon: Hessie Dibble, MD;  Location: [facility];  Service: Orthopedics;  Laterality: Right;   CATARACT EXTRACTION     both eyes   COLONOSCOPY W/ BIOPSIES AND POLYPECTOMY     DILATION AND CURETTAGE OF UTERUS     INJECTION KNEE  08/25/2011   Procedure: KNEE INJECTION;  Surgeon: Hessie Dibble, MD;  Location: [facility] [place];  Service: Orthopedics;  Laterality: Bilateral;   JOINT REPLACEMENT     KNEE ARTHROSCOPY  12/11/2008   bilat., with chondroplasty   KNEE JOINT MANIPULATION  10/30/2009   bilat.   OPEN REDUCTION INTERNAL FIXATION (ORIF) DISTAL RADIAL FRACTURE Left 11/17/2014   Procedure: OPEN REDUCTION INTERNAL FIXATION (ORIF) LEFT DISTAL RADIUS  FRACTURE;  Surgeon: Charlotte Crumb, MD;  Location: [facility];  Service: Orthopedics;  Laterality: Left;   TOTAL KNEE ARTHROPLASTY  09/26/2009   bilat.    FAMILY HISTORY Family History  Problem Relation Age of Onset   Heart disease Mother    Diabetes Other    Hypertension Other    Breast cancer Sister 27   Colon cancer Neg Hx    Stomach cancer Neg Hx     SOCIAL HISTORY Social History   Tobacco Use   Smoking status: Former    Types: Cigarettes    Quit date: 05/05/1985    Years since quitting: 36.9   Smokeless tobacco: Never  Substance Use Topics   Alcohol use: Yes    Alcohol/week: 4.0 standard drinks of alcohol    Types: 4 Glasses of wine per week     Comment: wkends.   Drug use: No         OPHTHALMIC EXAM:  Base Eye Exam     Visual Acuity (ETDRS)       Right Left   Dist [place] 20/20 20/20 -2         Tonometry (Tonopen, 1:27 PM)       Right Left   Pressure 17 21         Pupils       Pupils Dark Light Shape React APD   Right PERRL 3 2 Round Brisk None  Left PERRL 3 2 Round Brisk None         Visual Fields       Left Right    Full Full         Extraocular Movement       Right Left    Full Full         Neuro/Psych     Oriented x3: Yes         Dilation     Both eyes: 1.0% Mydriacyl, 2.5% Phenylephrine @ 1:27 PM           Slit Lamp and Fundus Exam     External Exam       Right Left   External Normal Normal         Slit Lamp Exam       Right Left   Lids/Lashes Normal Normal   Conjunctiva/Sclera White and quiet White and quiet   Cornea Clear Clear   Anterior Chamber Deep and quiet Deep and quiet   Iris Round and reactive Round and reactive   Lens Centered posterior chamber intraocular lens Centered posterior chamber intraocular lens   Anterior Vitreous Normal Normal         Fundus Exam       Right Left   Posterior Vitreous Normal Normal   Disc Normal Normal   C/D Ratio 0.4 0.4   Macula no macular thickening, no microaneurysms, no clinically significant macular edema no macular thickening, no microaneurysms, no clinically significant macular edema   Vessels no DR no DR   Periphery Normal Normal            IMAGING AND PROCEDURES  Imaging and Procedures for 03/26/22  OCT, Retina - OU - Both Eyes       Right Eye Quality was good. Scan locations included subfoveal. Central Foveal Thickness: 261. Progression has no prior data. Findings include normal foveal contour.   Left Eye Quality was good. Scan locations included subfoveal. Central Foveal Thickness: 271. Progression has no prior data. Findings include normal foveal contour.   Notes Incidental PVD      Color Fundus Photography Optos - OU - Both Eyes       Right Eye Progression has no prior data. Disc findings include normal observations. Macula : normal observations. Vessels : normal observations. Periphery : normal observations.   Left Eye Progression has no prior data. Disc findings include normal observations. Macula : normal observations. Vessels : normal observations. Periphery : normal observations.              ASSESSMENT/PLAN:  Diabetes mellitus without complication (HCC) No DR today  Posterior vitreous detachment, both eyes  The nature of posterior vitreous detachment was discussed with the patient as well as its physiology, its age prevalence, and its possible implication regarding retinal breaks and detachment.  An informational brochure was offered to the patient.  All the patient's questions were answered.  The patient was asked to return if new or different flashes or floaters develops.   Patient was instructed to contact office immediately if any new changes were noticed. I explained to the patient that vitreous inside the eye is similar to jello inside a bowl. As the jello melts it can start to pull away from the bowl, similarly the vitreous throughout our lives can begin to pull away from the retina. That process is called a posterior vitreous detachment. In some cases, the vitreous can tug hard enough on the retina to form a retinal  tear. I discussed with the patient the signs and symptoms of a retinal detachment.  Do not rub the eye.  No current symptoms  Pseudophakia, both eyes OU stable     ICD-10-CM   1. Posterior vitreous detachment, both eyes  H43.813 OCT, Retina - OU - Both Eyes    Color Fundus Photography Optos - OU - Both Eyes    2. Diabetes mellitus without complication ([facility])  K56.2     3. Pseudophakia, both eyes  Z96.1       1.  OU no detectable diabetic retinopathy.  2.  Incidental PVD OU  3.  Ophthalmic Meds Ordered this visit:  No  orders of the defined types were placed in this encounter.      Return in about 1 year (around 03/27/2023) for DILATE OU, COLOR FP.  There are no Patient Instructions on file for this visit.   Explained the diagnoses, plan, and follow up with the patient and they expressed understanding.  Patient expressed understanding of the importance of proper follow up care.   Clent Demark Dhruti Ghuman M.D. Diseases & Surgery of the Retina and Vitreous [facility]ina [facility] Diabetic [place] 03/26/22     Abbreviations: M myopia (nearsighted); A astigmatism; H hyperopia (farsighted); P presbyopia; Mrx spectacle prescription;  CTL contact lenses; OD right eye; OS left eye; OU both eyes  XT exotropia; ET esotropia; PEK punctate epithelial keratitis; PEE punctate epithelial erosions; DES dry eye syndrome; MGD meibomian gland dysfunction; ATs artificial tears; PFAT's preservative free artificial tears; [place] nuclear sclerotic cataract; PSC posterior subcapsular cataract; ERM epi-retinal membrane; PVD posterior vitreous detachment; RD retinal detachment; DM diabetes mellitus; DR diabetic retinopathy; NPDR non-proliferative diabetic retinopathy; PDR proliferative diabetic retinopathy; CSME clinically significant macular edema; DME diabetic macular edema; dbh dot blot hemorrhages; CWS cotton wool spot; POAG primary open angle glaucoma; C/D cup-to-disc ratio; HVF humphrey visual field; GVF goldmann visual field; OCT optical coherence tomography; IOP intraocular pressure; BRVO Branch retinal vein occlusion; CRVO central retinal vein occlusion; CRAO central retinal artery occlusion; BRAO branch retinal artery occlusion; RT retinal tear; SB scleral buckle; PPV pars plana vitrectomy; VH Vitreous hemorrhage; PRP panretinal laser photocoagulation; IVK intravitreal kenalog; VMT vitreomacular traction; MH Macular hole;  NVD neovascularization of the disc; NVE neovascularization elsewhere; AREDS age related eye disease study; ARMD age related macular  degeneration; POAG primary open angle glaucoma; EBMD epithelial/anterior basement membrane dystrophy; ACIOL anterior chamber intraocular lens; IOL intraocular lens; PCIOL posterior chamber intraocular lens; Phaco/IOL phacoemulsification with intraocular lens placement; [place] photorefractive keratectomy; LASIK laser assisted in situ keratomileusis; HTN hypertension; DM diabetes mellitus; COPD chronic obstructive pulmonary disease

## 2022-03-26 NOTE — Patient Instructions (Signed)
Access Code: [email or web address] URL: [email or web address] Date: 03/26/2022 Prepared by: Hilda Blades  Exercises - Long Sitting Calf Stretch with Strap  - 3 x daily - 3 reps - 30 seconds hold - Long Sitting Ankle Plantar Flexion with Resistance  - 1 x daily - 2 sets - 20 reps - Ankle Inversion Eversion Towel Slide  - 1 x daily - 2 sets - 20 reps - Seated Heel Toe Raises  - 1 x daily - 2 sets - 20 reps - Seated Toe Towel Scrunches  - 1 x daily - 2 sets - 20 reps

## 2022-03-26 NOTE — Therapy (Signed)
OUTPATIENT PHYSICAL THERAPY EVALUATION   Patient Name: Taylor Edwards MRN: [id number] DOB:Mar 31, 1941, 81 y.o., female Today's Date: 03/26/2022   PT End of Session - 03/26/22 1744     Visit Number 1    Number of Visits 12    Date for PT Re-Evaluation 05/21/22    Authorization Type BCBS / UHC MCR    Progress Note Due on Visit 10    PT Start Time 1700    PT Stop Time 1745    PT Time Calculation (min) 45 min    Activity Tolerance Patient tolerated treatment well    Behavior During Therapy WFL for tasks assessed/performed             Past Medical History:  Diagnosis Date   Anemia    no current med.   Arthritis    knees   Asthma    no current meds.   Cataract of both eyes    early   Diabetes mellitus    only taking natural supplements   GERD (gastroesophageal reflux disease)    no current med.   Hypertension    has been on med. x 10 yrs.   IBS (irritable bowel syndrome)    Post-nasal drip    current cough   Sickle cell trait (HCC)    Urinary urgency    Past Surgical History:  Procedure Laterality Date   ABDOMINAL HYSTERECTOMY     BUNIONECTOMY  08/25/2011   Procedure: [name];  Surgeon: Hessie Dibble, MD;  Location: [facility];  Service: Orthopedics;  Laterality: Right;   CATARACT EXTRACTION     both eyes   COLONOSCOPY W/ BIOPSIES AND POLYPECTOMY     DILATION AND CURETTAGE OF UTERUS     INJECTION KNEE  08/25/2011   Procedure: KNEE INJECTION;  Surgeon: Hessie Dibble, MD;  Location: [facility];  Service: Orthopedics;  Laterality: Bilateral;   JOINT REPLACEMENT     KNEE ARTHROSCOPY  12/11/2008   bilat., with chondroplasty   KNEE JOINT MANIPULATION  10/30/2009   bilat.   OPEN REDUCTION INTERNAL FIXATION (ORIF) DISTAL RADIAL FRACTURE Left 11/17/2014   Procedure: OPEN REDUCTION INTERNAL FIXATION (ORIF) LEFT DISTAL RADIUS  FRACTURE;  Surgeon: Charlotte Crumb, MD;  Location: [facility];  Service: Orthopedics;  Laterality: Left;   TOTAL  KNEE ARTHROPLASTY  09/26/2009   bilat.   Patient Active Problem List   Diagnosis Date Noted   Posterior vitreous detachment, both eyes 03/26/2022   Diabetes mellitus without complication ([facility]) 00/93/8182   Pseudophakia, both eyes 03/26/2022   History of total knee replacement, left 04/03/2019   History of total knee arthroplasty, right 04/03/2019   DERMATOFIBROMA 10/26/2007   ANEMIA, CHRONIC 10/26/2007   SICKLE CELL TRAIT 10/26/2007   ESSENTIAL HYPERTENSION 10/26/2007   GASTROESOPHAGEAL REFLUX DISEASE 10/26/2007   GASTRITIS 10/26/2007   HIATAL HERNIA 10/26/2007   CONSTIPATION, CHRONIC 10/26/2007   GUAIAC POSITIVE STOOL 10/26/2007    PCP: Lajean Manes, MD  REFERRING PROVIDER: Wylene Simmer, MD  REFERRING DIAG: Achilles tendinitis, right leg  THERAPY DIAG:  Pain in right ankle and joints of right foot  Muscle weakness (generalized)  Other abnormalities of gait and mobility  Rationale for Evaluation and Treatment Rehabilitation  ONSET DATE: "a long time"   SUBJECTIVE:  SUBJECTIVE STATEMENT: Patient reports pain in the back of the right heel for a long time. She states she dealt with the pain until her doctor told her she had some  Pain depends on what type  of shoe she is using. It feels better with cloth shoes and with a higher heel.   PERTINENT HISTORY: Bilateral TKA, DM, HTN, IBS, sickle cell trait   PAIN:  Are you having pain? Yes:  NPRS scale: 2/10 (0/10 at rest, 6/10 with activity) Pain location: Right heel Pain description: Intermittent, sharp to stabbing Aggravating factors: Walking and standing, pressure on the heel Relieving factors: Rest, take pressure off heel  PRECAUTIONS: None  WEIGHT BEARING RESTRICTIONS No  FALLS:  Has patient fallen in last 6 months? No  LIVING ENVIRONMENT: Lives with: lives alone Lives in: House/apartment Stairs: Yes: Internal: 16 steps; on left going up and External: 5 steps; on right going up  OCCUPATION: Currently  working mostly sitting  PLOF: Independent  PATIENT GOALS: Improve walking ability with less pain   OBJECTIVE:  DIAGNOSTIC FINDINGS: None  PATIENT SURVEYS:  FOTO 40% functional status  COGNITION: Overall cognitive status: Within functional limits for tasks assessed     SENSATION: WFL  EDEMA:  Patient does exhibit bilateral foot/ankle swelling  MUSCLE LENGTH: Impaired calf flexibility  PALPATION: Tender to palpation achilles insertion posterior heel  LOWER EXTREMITY ROM:  Active ROM Right eval Left eval  Ankle dorsiflexion - 3  Ankle plantarflexion - 40  Ankle inversion - 25  Ankle eversion - 5   LOWER EXTREMITY MMT:  MMT Right eval Left eval  Hip flexion 4 4  Hip extension 3 3  Hip abduction 3 3  Knee flexion 5 5  Knee extension 5 5  Ankle dorsiflexion 4 5  Ankle plantarflexion 2 3  Ankle inversion 4 5  Ankle eversion 4 5   FUNCTIONAL TESTS:  SLS: unable on right  GAIT: Assistive device utilized: None Level of assistance: Complete Independence Comments: Antalgic on right with increased toe out   TODAY'S TREATMENT: Longsitting calf stretch with strap 2 x 30 sec Longsitting ankle PF with red 2 x 20 Seated ankle inversion/eversion on towel x 20 Seated heel toe raises x 20 Seated towel scrunches x 20   PATIENT EDUCATION:  Education details: Exam findings, POC, HEP Person educated: Patient Education method: Explanation, Demonstration, Tactile cues, Verbal cues, and Handouts Education comprehension: verbalized understanding, returned demonstration, verbal cues required, tactile cues required, and needs further education  HOME EXERCISE PROGRAM: Access Code: [email or web address]   ASSESSMENT: CLINICAL IMPRESSION: Patient is a 81 y.o. female who was seen today for physical therapy evaluation and treatment for right ankle pain consistent with insertional achilles tendinopathy. She exhibits limitations in ankle DF mobility and gross ankle strength with  inability to perform SLS on the right.   OBJECTIVE IMPAIRMENTS Abnormal gait, decreased activity tolerance, decreased balance, decreased ROM, decreased strength, impaired flexibility, and pain.   ACTIVITY LIMITATIONS standing, stairs, and locomotion level  PARTICIPATION LIMITATIONS: meal prep, cleaning, laundry, shopping, community activity, and occupation  PERSONAL FACTORS Age, Fitness, and Time since onset of injury/illness/exacerbation are also affecting patient's functional outcome.   REHAB POTENTIAL: Good  CLINICAL DECISION MAKING: Stable/uncomplicated  EVALUATION COMPLEXITY: Low   GOALS: Goals reviewed with patient? Yes  SHORT TERM GOALS: Target date: 04/23/2022 Patient will be I with initial HEP in order to progress with therapy. Baseline: HEP provided at eval Goal status: INITIAL  2.  PT will review FOTO with patient by 3rd visit in order to understand expected progress and outcome with therapy. Baseline: FOTO assessed at eval Goal status: INITIAL  3.  Patient will report pain with walking/standing </= 3/10 in order to reduce functional limitations Baseline:  6/10 pain Goal status: INITIAL  LONG TERM GOALS: Target date: 05/21/2022  Patient will be I with final HEP to maintain progress from PT. Baseline: HEP provided at eval Goal status: INITIAL  2.  Patient will report >/= 55% status on FOTO to indicate improved functional ability. Baseline: 40% Goal status: INITIAL  3.  Patient will demonstrate improve right ankle DF >/= 8 deg in order to improve gait and reduce pain with walking. Baseline: right ankle DF 3 deg Goal status: INITIAL  4.  Patient will exhibit right ankle PF strength >/= 4/5 MMT in order to improve standing and walking tolerance. Baseline: right ankle PF strength 2/5 MMT Goal status: INITIAL   PLAN: PT FREQUENCY: 1-2x/week  PT DURATION: 8 weeks  PLANNED INTERVENTIONS: Therapeutic exercises, Therapeutic activity, Neuromuscular re-education,  Balance training, Gait training, Patient/Family education, Joint manipulation, Joint mobilization, Aquatic Therapy, Dry Needling, Electrical stimulation, Cryotherapy, Moist heat, Taping, Ultrasound, Ionotophoresis '4mg'$ /ml Dexamethasone, Manual therapy, and Re-evaluation  PLAN FOR NEXT SESSION: Review HEP and progress PRN, manual/stretching to improve ankle DF, progress calf and general ankle strength, balance training   Hilda Blades, PT, DPT, LAT, ATC 03/27/22  8:03 AM Phone: [phone] Fax: [phone]

## 2022-03-26 NOTE — Assessment & Plan Note (Signed)
No DR today

## 2022-03-26 NOTE — Assessment & Plan Note (Signed)
The nature of posterior vitreous detachment was discussed with the patient as well as its physiology, its age prevalence, and its possible implication regarding retinal breaks and detachment.  An informational brochure was offered to the patient.  All the patient's questions were answered.  The patient was asked to return if new or different flashes or floaters develops.   Patient was instructed to contact office immediately if any new changes were noticed. I explained to the patient that vitreous inside the eye is similar to jello inside a bowl. As the jello melts it can start to pull away from the bowl, similarly the vitreous throughout our lives can begin to pull away from the retina. That process is called a posterior vitreous detachment. In some cases, the vitreous can tug hard enough on the retina to form a retinal tear. I discussed with the patient the signs and symptoms of a retinal detachment.  Do not rub the eye.  No current symptoms

## 2022-04-02 ENCOUNTER — Telehealth: Payer: Self-pay

## 2022-04-02 ENCOUNTER — Ambulatory Visit: Payer: BC Managed Care – PPO

## 2022-04-02 NOTE — Telephone Encounter (Signed)
Spoke with patient regarding missed appointment. She reports that she called yesterday to cancel her appointment. Reminded patient of next scheduled visit.   Gwendolyn Grant, PT, DPT, ATC 04/02/22 5:18 PM

## 2022-04-03 DIAGNOSIS — R350 Frequency of micturition: Secondary | ICD-10-CM | POA: Diagnosis not present

## 2022-04-06 NOTE — Therapy (Signed)
OUTPATIENT PHYSICAL THERAPY TREATMENT NOTE   Patient Name: Taylor Edwards MRN: [id number] DOB:October 17, 1940, 81 y.o., female Today's Date: 04/07/2022  PCP: Lajean Manes, MD   REFERRING PROVIDER: Wylene Simmer, MD   END OF SESSION:   PT End of Session - 04/07/22 1550     Visit Number 2    Number of Visits 12    Date for PT Re-Evaluation 05/21/22    Authorization Type BCBS / UHC MCR    Progress Note Due on Visit 10    PT Start Time 1829    PT Stop Time 1610    PT Time Calculation (min) 40 min    Activity Tolerance Patient tolerated treatment well    Behavior During Therapy WFL for tasks assessed/performed             Past Medical History:  Diagnosis Date   Anemia    no current med.   Arthritis    knees   Asthma    no current meds.   Cataract of both eyes    early   Diabetes mellitus    only taking natural supplements   GERD (gastroesophageal reflux disease)    no current med.   Hypertension    has been on med. x 10 yrs.   IBS (irritable bowel syndrome)    Post-nasal drip    current cough   Sickle cell trait (HCC)    Urinary urgency    Past Surgical History:  Procedure Laterality Date   ABDOMINAL HYSTERECTOMY     BUNIONECTOMY  08/25/2011   Procedure: [name];  Surgeon: Hessie Dibble, MD;  Location: [place];  Service: Orthopedics;  Laterality: Right;   CATARACT EXTRACTION     both eyes   COLONOSCOPY W/ BIOPSIES AND POLYPECTOMY     DILATION AND CURETTAGE OF UTERUS     INJECTION KNEE  08/25/2011   Procedure: KNEE INJECTION;  Surgeon: Hessie Dibble, MD;  Location: [place];  Service: Orthopedics;  Laterality: Bilateral;   JOINT REPLACEMENT     KNEE ARTHROSCOPY  12/11/2008   bilat., with chondroplasty   KNEE JOINT MANIPULATION  10/30/2009   bilat.   OPEN REDUCTION INTERNAL FIXATION (ORIF) DISTAL RADIAL FRACTURE Left 11/17/2014   Procedure: OPEN REDUCTION INTERNAL FIXATION (ORIF) LEFT DISTAL RADIUS  FRACTURE;   Surgeon: Charlotte Crumb, MD;  Location: [place];  Service: Orthopedics;  Laterality: Left;   TOTAL KNEE ARTHROPLASTY  09/26/2009   bilat.   Patient Active Problem List   Diagnosis Date Noted   Posterior vitreous detachment, both eyes 03/26/2022   Diabetes mellitus without complication ([place]) 93/71/6967   Pseudophakia, both eyes 03/26/2022   History of total knee replacement, left 04/03/2019   History of total knee arthroplasty, right 04/03/2019   DERMATOFIBROMA 10/26/2007   ANEMIA, CHRONIC 10/26/2007   SICKLE CELL TRAIT 10/26/2007   ESSENTIAL HYPERTENSION 10/26/2007   GASTROESOPHAGEAL REFLUX DISEASE 10/26/2007   GASTRITIS 10/26/2007   HIATAL HERNIA 10/26/2007   CONSTIPATION, CHRONIC 10/26/2007   GUAIAC POSITIVE STOOL 10/26/2007    REFERRING DIAG: Achilles tendinitis, right leg  THERAPY DIAG:  Pain in right ankle and joints of right foot  Muscle weakness (generalized)  Other abnormalities of gait and mobility  Rationale for Evaluation and Treatment Rehabilitation  PERTINENT HISTORY: Bilateral TKA, DM, HTN, IBS, sickle cell trait   PRECAUTIONS: None   SUBJECTIVE: Patient reports she is feeling no better.   PAIN:  Are you having pain? Yes:  NPRS scale: 7/10 (0/10 at  rest, 6/10 with activity) Pain location: Right heel Pain description: Intermittent, sharp to stabbing Aggravating factors: Walking and standing, pressure on the heel Relieving factors: Rest, take pressure off heel  PATIENT GOALS: Improve walking ability with less pain   OBJECTIVE: (objective measures completed at initial evaluation unless otherwise dated) PATIENT SURVEYS:  FOTO 40% functional status   EDEMA:  Patient does exhibit bilateral foot/ankle swelling   MUSCLE LENGTH: Impaired calf flexibility   PALPATION: Tender to palpation achilles insertion posterior heel   LOWER EXTREMITY ROM:   Active ROM Right eval Left eval  Ankle dorsiflexion - 3  Ankle plantarflexion - 40  Ankle inversion  - 25  Ankle eversion - 5    LOWER EXTREMITY MMT:   MMT Right eval Left eval  Hip flexion 4 4  Hip extension 3 3  Hip abduction 3 3  Knee flexion 5 5  Knee extension 5 5  Ankle dorsiflexion 4 5  Ankle plantarflexion 2 3  Ankle inversion 4 5  Ankle eversion 4 5    FUNCTIONAL TESTS:  SLS: unable on right   GAIT: Assistive device utilized: None Level of assistance: Complete Independence Comments: Antalgic on right with increased toe out     TODAY'S TREATMENT: [facility] Adult PT Treatment:                                                DATE: 04/07/2022 Therapeutic Exercise: NuStep L5 x 5 with UE/LE while taking subjective Slant board calf stretch 3 x 30 sec Standing heel toe raises 2 x 20 Tandem stance 3 x 20 sec each 4-way ankle with red 2 x 20 each Seated towel scrunches 2 x 20 Seated ankle inversion/eversion on towel 2 x 20 Toe yoga x 20 each Standing calf stretch at counter x 30 sec   [facility] Adult PT Treatment:                                                DATE: 03/26/2022 Therapeutic Exercise: Longsitting calf stretch with strap 2 x 30 sec Longsitting ankle PF with red 2 x 20 Seated ankle inversion/eversion on towel x 20 Seated heel toe raises x 20 Seated towel scrunches x 20   PATIENT EDUCATION:  Education details: HEP Person educated: Patient Education method: Consulting civil engineer, Demonstration, Corporate treasurer cues, Verbal cues, and Handouts Education comprehension: verbalized understanding, returned demonstration, verbal cues required, tactile cues required, and needs further education   HOME EXERCISE PROGRAM: Access Code: [email or web address]     ASSESSMENT: CLINICAL IMPRESSION: Patient tolerated therapy well with no adverse effects. Therapy focused on progressing ankle/foot flexibility, strength, and stability. She tolerated progression to more standing exercises well and reported improvement in her symptoms with therapy. She did have difficulty coordinating toe yoga and required cueing  for proper technique with exercises. Updated HEP with good tolerance. Patient would benefit from continued skilled PT to progress her mobility and strength in order to reduce pain and maximize functional ability.     OBJECTIVE IMPAIRMENTS Abnormal gait, decreased activity tolerance, decreased balance, decreased ROM, decreased strength, impaired flexibility, and pain.    ACTIVITY LIMITATIONS standing, stairs, and locomotion level   PARTICIPATION LIMITATIONS: meal prep, cleaning, laundry, shopping, community activity, and occupation  PERSONAL FACTORS Age, Fitness, and Time since onset of injury/illness/exacerbation are also affecting patient's functional outcome.      GOALS: Goals reviewed with patient? Yes   SHORT TERM GOALS: Target date: 04/23/2022 Patient will be I with initial HEP in order to progress with therapy. Baseline: HEP provided at eval Goal status: INITIAL   2.  PT will review FOTO with patient by 3rd visit in order to understand expected progress and outcome with therapy. Baseline: FOTO assessed at eval Goal status: INITIAL   3.  Patient will report pain with walking/standing </= 3/10 in order to reduce functional limitations Baseline: 6/10 pain Goal status: INITIAL   LONG TERM GOALS: Target date: 05/21/2022   Patient will be I with final HEP to maintain progress from PT. Baseline: HEP provided at eval Goal status: INITIAL   2.  Patient will report >/= 55% status on FOTO to indicate improved functional ability. Baseline: 40% Goal status: INITIAL   3.  Patient will demonstrate improve right ankle DF >/= 8 deg in order to improve gait and reduce pain with walking. Baseline: right ankle DF 3 deg Goal status: INITIAL   4.  Patient will exhibit right ankle PF strength >/= 4/5 MMT in order to improve standing and walking tolerance. Baseline: right ankle PF strength 2/5 MMT Goal status: INITIAL     PLAN: PT FREQUENCY: 1-2x/week   PT DURATION: 8 weeks   PLANNED  INTERVENTIONS: Therapeutic exercises, Therapeutic activity, Neuromuscular re-education, Balance training, Gait training, Patient/Family education, Joint manipulation, Joint mobilization, Aquatic Therapy, Dry Needling, Electrical stimulation, Cryotherapy, Moist heat, Taping, Ultrasound, Ionotophoresis '4mg'$ /ml Dexamethasone, Manual therapy, and Re-evaluation   PLAN FOR NEXT SESSION: Review HEP and progress PRN, manual/stretching to improve ankle DF, progress calf and general ankle strength, balance training    Hilda Blades, PT, DPT, LAT, ATC 04/07/22  4:12 PM Phone: [phone] Fax: [phone]

## 2022-04-07 ENCOUNTER — Ambulatory Visit: Payer: BC Managed Care – PPO | Admitting: Physical Therapy

## 2022-04-07 ENCOUNTER — Other Ambulatory Visit: Payer: Self-pay

## 2022-04-07 ENCOUNTER — Encounter: Payer: Self-pay | Admitting: Physical Therapy

## 2022-04-07 DIAGNOSIS — R2689 Other abnormalities of gait and mobility: Secondary | ICD-10-CM

## 2022-04-07 DIAGNOSIS — M25571 Pain in right ankle and joints of right foot: Secondary | ICD-10-CM | POA: Diagnosis not present

## 2022-04-07 DIAGNOSIS — M6281 Muscle weakness (generalized): Secondary | ICD-10-CM | POA: Diagnosis not present

## 2022-04-07 NOTE — Patient Instructions (Signed)
Access Code: [email or web address] URL: [email or web address] Date: 04/07/2022 Prepared by: Hilda Blades  Exercises - Long Sitting Ankle Plantar Flexion with Resistance  - 1 x daily - 2 sets - 20 reps - Ankle Inversion Eversion Towel Slide  - 1 x daily - 2 sets - 20 reps - Seated Toe Towel Scrunches  - 1 x daily - 2 sets - 20 reps - Heel Toe Raises with Counter Support  - 1 x daily - 2 sets - 20 reps - Standing Tandem Balance with Counter Support  - 1 x daily - 3 reps - 30 seconds hold - Standing Gastroc Stretch at Counter  - 2 x daily - 3 reps - 30 seconds hold

## 2022-04-08 ENCOUNTER — Ambulatory Visit: Payer: BC Managed Care – PPO | Admitting: Physical Therapy

## 2022-04-08 ENCOUNTER — Encounter: Payer: Self-pay | Admitting: Physical Therapy

## 2022-04-08 ENCOUNTER — Other Ambulatory Visit: Payer: Self-pay

## 2022-04-08 DIAGNOSIS — M6281 Muscle weakness (generalized): Secondary | ICD-10-CM | POA: Diagnosis not present

## 2022-04-08 DIAGNOSIS — R2689 Other abnormalities of gait and mobility: Secondary | ICD-10-CM | POA: Diagnosis not present

## 2022-04-08 DIAGNOSIS — M25571 Pain in right ankle and joints of right foot: Secondary | ICD-10-CM

## 2022-04-08 NOTE — Therapy (Signed)
OUTPATIENT PHYSICAL THERAPY TREATMENT NOTE   Patient Name: Taylor Edwards MRN: [id number] DOB:07/24/1941, 81 y.o., female Today's Date: 04/08/2022  PCP: Lajean Manes, MD   REFERRING PROVIDER: Wylene Simmer, MD   END OF SESSION:   PT End of Session - 04/08/22 1530     Visit Number 3    Number of Visits 12    Date for PT Re-Evaluation 05/21/22    Authorization Type BCBS / UHC MCR    Progress Note Due on Visit 10    PT Start Time 9381    PT Stop Time 1608    PT Time Calculation (min) 38 min    Activity Tolerance Patient tolerated treatment well    Behavior During Therapy WFL for tasks assessed/performed              Past Medical History:  Diagnosis Date   Anemia    no current med.   Arthritis    knees   Asthma    no current meds.   Cataract of both eyes    early   Diabetes mellitus    only taking natural supplements   GERD (gastroesophageal reflux disease)    no current med.   Hypertension    has been on med. x 10 yrs.   IBS (irritable bowel syndrome)    Post-nasal drip    current cough   Sickle cell trait (HCC)    Urinary urgency    Past Surgical History:  Procedure Laterality Date   ABDOMINAL HYSTERECTOMY     BUNIONECTOMY  08/25/2011   Procedure: [name];  Surgeon: Hessie Dibble, MD;  Location: [place];  Service: Orthopedics;  Laterality: Right;   CATARACT EXTRACTION     both eyes   COLONOSCOPY W/ BIOPSIES AND POLYPECTOMY     DILATION AND CURETTAGE OF UTERUS     INJECTION KNEE  08/25/2011   Procedure: KNEE INJECTION;  Surgeon: Hessie Dibble, MD;  Location: [place];  Service: Orthopedics;  Laterality: Bilateral;   JOINT REPLACEMENT     KNEE ARTHROSCOPY  12/11/2008   bilat., with chondroplasty   KNEE JOINT MANIPULATION  10/30/2009   bilat.   OPEN REDUCTION INTERNAL FIXATION (ORIF) DISTAL RADIAL FRACTURE Left 11/17/2014   Procedure: OPEN REDUCTION INTERNAL FIXATION (ORIF) LEFT DISTAL RADIUS  FRACTURE;   Surgeon: Charlotte Crumb, MD;  Location: [place];  Service: Orthopedics;  Laterality: Left;   TOTAL KNEE ARTHROPLASTY  09/26/2009   bilat.   Patient Active Problem List   Diagnosis Date Noted   Posterior vitreous detachment, both eyes 03/26/2022   Diabetes mellitus without complication ([place]) 01/75/1025   Pseudophakia, both eyes 03/26/2022   History of total knee replacement, left 04/03/2019   History of total knee arthroplasty, right 04/03/2019   DERMATOFIBROMA 10/26/2007   ANEMIA, CHRONIC 10/26/2007   SICKLE CELL TRAIT 10/26/2007   ESSENTIAL HYPERTENSION 10/26/2007   GASTROESOPHAGEAL REFLUX DISEASE 10/26/2007   GASTRITIS 10/26/2007   HIATAL HERNIA 10/26/2007   CONSTIPATION, CHRONIC 10/26/2007   GUAIAC POSITIVE STOOL 10/26/2007    REFERRING DIAG: Achilles tendinitis, right leg  THERAPY DIAG:  Pain in right ankle and joints of right foot  Muscle weakness (generalized)  Other abnormalities of gait and mobility  Rationale for Evaluation and Treatment Rehabilitation  PERTINENT HISTORY: Bilateral TKA, DM, HTN, IBS, sickle cell trait   PRECAUTIONS: None   SUBJECTIVE: Patient reports she is still having the pain on the bottom of her heel when she walks. Denies any pain at  rest or not walking.   PAIN:  Are you having pain? Yes:  NPRS scale: 5/10 (0/10 at rest, 6/10 with activity) Pain location: Right heel Pain description: Intermittent, sharp to stabbing Aggravating factors: Walking and standing, pressure on the heel Relieving factors: Rest, take pressure off heel  PATIENT GOALS: Improve walking ability with less pain   OBJECTIVE: (objective measures completed at initial evaluation unless otherwise dated) PATIENT SURVEYS:  FOTO 40% functional status   EDEMA:  Patient does exhibit bilateral foot/ankle swelling   MUSCLE LENGTH: Impaired calf flexibility   PALPATION: Tender to palpation achilles insertion posterior heel   LOWER EXTREMITY ROM:   Active ROM  Right eval Left eval Right 04/08/2022  Ankle dorsiflexion 3 - 5  Ankle plantarflexion 40 -   Ankle inversion 25 -   Ankle eversion 5 -     LOWER EXTREMITY MMT:   MMT Right eval Left eval  Hip flexion 4 4  Hip extension 3 3  Hip abduction 3 3  Knee flexion 5 5  Knee extension 5 5  Ankle dorsiflexion 4 5  Ankle plantarflexion 2 3  Ankle inversion 4 5  Ankle eversion 4 5    FUNCTIONAL TESTS:  SLS: unable on right   GAIT: Assistive device utilized: None Level of assistance: Complete Independence Comments: Antalgic on right with increased toe out     TODAY'S TREATMENT: [facility] Adult PT Treatment:                                                DATE: 04/08/2022 Therapeutic Exercise: NuStep L5 x 5 with UE/LE while taking subjective Slant board calf stretch 3 x 30 sec Standing calf stretch at counter x 30 sec Standing heel toe raises 3 x 20 Tandem stance 3 x 20 sec each Heel raises on edge of step 2 x 20 4-way ankle with red 2 x 20 each Seated heel raises with 25# 3 x 20 Sit to stand 2 x 15   [facility] Adult PT Treatment:                                                DATE: 04/07/2022 Therapeutic Exercise: NuStep L5 x 5 with UE/LE while taking subjective Slant board calf stretch 3 x 30 sec Standing heel toe raises 2 x 20 Tandem stance 3 x 20 sec each 4-way ankle with red 2 x 20 each Seated towel scrunches 2 x 20 Seated ankle inversion/eversion on towel 2 x 20 Toe yoga x 20 each Standing calf stretch at counter x 30 sec  [facility] Adult PT Treatment:                                                DATE: 03/26/2022 Therapeutic Exercise: Longsitting calf stretch with strap 2 x 30 sec Longsitting ankle PF with red 2 x 20 Seated ankle inversion/eversion on towel x 20 Seated heel toe raises x 20 Seated towel scrunches x 20   PATIENT EDUCATION:  Education details: HEP Person educated: Patient Education method: Consulting civil engineer, Demonstration, Corporate treasurer cues, Verbal cues Education  comprehension: verbalized  understanding, returned demonstration, verbal cues required, tactile cues required, and needs further education   HOME EXERCISE PROGRAM: Access Code: [email or web address]     ASSESSMENT: CLINICAL IMPRESSION: Patient tolerated therapy well with no adverse effects. She demonstrates improve ankle dorsiflexion this visit. Therapy focused on continued ankle mobility and strengthening exercises with good tolerance. She continues to report improvement in symptoms following therapy but reports persistent pain with weight bearing. No changes to HEP this visit.  Patient would benefit from continued skilled PT to progress her mobility and strength in order to reduce pain and maximize functional ability.     OBJECTIVE IMPAIRMENTS Abnormal gait, decreased activity tolerance, decreased balance, decreased ROM, decreased strength, impaired flexibility, and pain.    ACTIVITY LIMITATIONS standing, stairs, and locomotion level   PARTICIPATION LIMITATIONS: meal prep, cleaning, laundry, shopping, community activity, and occupation   PERSONAL FACTORS Age, Fitness, and Time since onset of injury/illness/exacerbation are also affecting patient's functional outcome.      GOALS: Goals reviewed with patient? Yes   SHORT TERM GOALS: Target date: 04/23/2022  Patient will be I with initial HEP in order to progress with therapy. Baseline: HEP provided at eval Goal status: INITIAL   2.  PT will review FOTO with patient by 3rd visit in order to understand expected progress and outcome with therapy. Baseline: FOTO assessed at eval Goal status: INITIAL   3.  Patient will report pain with walking/standing </= 3/10 in order to reduce functional limitations Baseline: 6/10 pain Goal status: INITIAL   LONG TERM GOALS: Target date: 05/21/2022   Patient will be I with final HEP to maintain progress from PT. Baseline: HEP provided at eval Goal status: INITIAL   2.  Patient will report >/= 55% status on  FOTO to indicate improved functional ability. Baseline: 40% Goal status: INITIAL   3.  Patient will demonstrate improve right ankle DF >/= 8 deg in order to improve gait and reduce pain with walking. Baseline: right ankle DF 3 deg Goal status: INITIAL   4.  Patient will exhibit right ankle PF strength >/= 4/5 MMT in order to improve standing and walking tolerance. Baseline: right ankle PF strength 2/5 MMT Goal status: INITIAL     PLAN: PT FREQUENCY: 1-2x/week   PT DURATION: 8 weeks   PLANNED INTERVENTIONS: Therapeutic exercises, Therapeutic activity, Neuromuscular re-education, Balance training, Gait training, Patient/Family education, Joint manipulation, Joint mobilization, Aquatic Therapy, Dry Needling, Electrical stimulation, Cryotherapy, Moist heat, Taping, Ultrasound, Ionotophoresis '4mg'$ /ml Dexamethasone, Manual therapy, and Re-evaluation   PLAN FOR NEXT SESSION: Review HEP and progress PRN, manual/stretching to improve ankle DF, progress calf and general ankle strength, balance training    Hilda Blades, PT, DPT, LAT, ATC 04/08/22  4:09 PM Phone: [phone] Fax: [phone]

## 2022-04-10 DIAGNOSIS — R3915 Urgency of urination: Secondary | ICD-10-CM | POA: Diagnosis not present

## 2022-04-10 DIAGNOSIS — N3281 Overactive bladder: Secondary | ICD-10-CM | POA: Diagnosis not present

## 2022-04-10 DIAGNOSIS — M25561 Pain in right knee: Secondary | ICD-10-CM | POA: Diagnosis not present

## 2022-04-10 DIAGNOSIS — M79672 Pain in left foot: Secondary | ICD-10-CM | POA: Diagnosis not present

## 2022-04-13 ENCOUNTER — Ambulatory Visit: Payer: BC Managed Care – PPO | Admitting: Physical Therapy

## 2022-04-13 ENCOUNTER — Encounter: Payer: Self-pay | Admitting: Physical Therapy

## 2022-04-13 ENCOUNTER — Other Ambulatory Visit: Payer: Self-pay

## 2022-04-13 DIAGNOSIS — M6281 Muscle weakness (generalized): Secondary | ICD-10-CM

## 2022-04-13 DIAGNOSIS — M25571 Pain in right ankle and joints of right foot: Secondary | ICD-10-CM | POA: Diagnosis not present

## 2022-04-13 DIAGNOSIS — R2689 Other abnormalities of gait and mobility: Secondary | ICD-10-CM | POA: Diagnosis not present

## 2022-04-13 NOTE — Therapy (Signed)
OUTPATIENT PHYSICAL THERAPY TREATMENT NOTE   Patient Name: Taylor Edwards MRN: [id number] DOB:Feb 14, 1941, 81 y.o., female Today's Date: 04/13/2022  PCP: Lajean Manes, MD   REFERRING PROVIDER: Wylene Simmer, MD   END OF SESSION:   PT End of Session - 04/13/22 1538     Visit Number 4    Number of Visits 12    Date for PT Re-Evaluation 05/21/22    Authorization Type BCBS / UHC MCR    Progress Note Due on Visit 10    PT Start Time 5053    PT Stop Time 1610    PT Time Calculation (min) 40 min    Activity Tolerance Patient tolerated treatment well    Behavior During Therapy WFL for tasks assessed/performed               Past Medical History:  Diagnosis Date   Anemia    no current med.   Arthritis    knees   Asthma    no current meds.   Cataract of both eyes    early   Diabetes mellitus    only taking natural supplements   GERD (gastroesophageal reflux disease)    no current med.   Hypertension    has been on med. x 10 yrs.   IBS (irritable bowel syndrome)    Post-nasal drip    current cough   Sickle cell trait (HCC)    Urinary urgency    Past Surgical History:  Procedure Laterality Date   ABDOMINAL HYSTERECTOMY     BUNIONECTOMY  08/25/2011   Procedure: [name];  Surgeon: Hessie Dibble, MD;  Location: [facility];  Service: Orthopedics;  Laterality: Right;   CATARACT EXTRACTION     both eyes   COLONOSCOPY W/ BIOPSIES AND POLYPECTOMY     DILATION AND CURETTAGE OF UTERUS     INJECTION KNEE  08/25/2011   Procedure: KNEE INJECTION;  Surgeon: Hessie Dibble, MD;  Location: [facility];  Service: Orthopedics;  Laterality: Bilateral;   JOINT REPLACEMENT     KNEE ARTHROSCOPY  12/11/2008   bilat., with chondroplasty   KNEE JOINT MANIPULATION  10/30/2009   bilat.   OPEN REDUCTION INTERNAL FIXATION (ORIF) DISTAL RADIAL FRACTURE Left 11/17/2014   Procedure: OPEN REDUCTION INTERNAL FIXATION (ORIF) LEFT DISTAL RADIUS  FRACTURE;   Surgeon: Charlotte Crumb, MD;  Location: [facility];  Service: Orthopedics;  Laterality: Left;   TOTAL KNEE ARTHROPLASTY  09/26/2009   bilat.   Patient Active Problem List   Diagnosis Date Noted   Posterior vitreous detachment, both eyes 03/26/2022   Diabetes mellitus without complication ([place]) 97/67/3419   Pseudophakia, both eyes 03/26/2022   History of total knee replacement, left 04/03/2019   History of total knee arthroplasty, right 04/03/2019   DERMATOFIBROMA 10/26/2007   ANEMIA, CHRONIC 10/26/2007   SICKLE CELL TRAIT 10/26/2007   ESSENTIAL HYPERTENSION 10/26/2007   GASTROESOPHAGEAL REFLUX DISEASE 10/26/2007   GASTRITIS 10/26/2007   HIATAL HERNIA 10/26/2007   CONSTIPATION, CHRONIC 10/26/2007   GUAIAC POSITIVE STOOL 10/26/2007    REFERRING DIAG: Achilles tendinitis, right leg  THERAPY DIAG:  Pain in right ankle and joints of right foot  Muscle weakness (generalized)  Other abnormalities of gait and mobility  Rationale for Evaluation and Treatment Rehabilitation  PERTINENT HISTORY: Bilateral TKA, DM, HTN, IBS, sickle cell trait   PRECAUTIONS: None   SUBJECTIVE: Patient reports she had a shot in the heel but they did it on the wrong side of the heel  so she is going to schedule an appointment to have it on the correct side of the heel.   PAIN:  Are you having pain? Yes:  NPRS scale: 4/10 (0/10 at rest, 6/10 with activity) Pain location: Right heel Pain description: Intermittent, sharp to stabbing Aggravating factors: Walking and standing, pressure on the heel Relieving factors: Rest, take pressure off heel  PATIENT GOALS: Improve walking ability with less pain   OBJECTIVE: (objective measures completed at initial evaluation unless otherwise dated) PATIENT SURVEYS:  FOTO 40% functional status   EDEMA:  Patient does exhibit bilateral foot/ankle swelling   MUSCLE LENGTH: Impaired calf flexibility   PALPATION: Tender to palpation achilles insertion posterior  heel   LOWER EXTREMITY ROM:   Active ROM Right eval Left eval Right 04/08/2022  Ankle dorsiflexion 3 - 5  Ankle plantarflexion 40 -   Ankle inversion 25 -   Ankle eversion 5 -     LOWER EXTREMITY MMT:   MMT Right eval Left eval  Hip flexion 4 4  Hip extension 3 3  Hip abduction 3 3  Knee flexion 5 5  Knee extension 5 5  Ankle dorsiflexion 4 5  Ankle plantarflexion 2 3  Ankle inversion 4 5  Ankle eversion 4 5    FUNCTIONAL TESTS:  SLS: unable on right   GAIT: Assistive device utilized: None Level of assistance: Complete Independence Comments: Antalgic on right with increased toe out     TODAY'S TREATMENT: [facility] Adult PT Treatment:                                                DATE: 04/13/2022 Therapeutic Exercise: NuStep L5 x 5 with UE/LE while taking subjective Slant board calf stretch 4 x 30 sec Standing heel toe raises 3 x 20 Seated heel raises with 25# 3 x 20 Sit to stand 2 x 10 4-way ankle with green 2 x 20 each Neuro Reeducation: Tandem stance 2 x 30 sec each Rockerboard fwd/bwd taps 2 x 20 Romberg on Airex with head turns and nods 2 x 10 each   [facility] Adult PT Treatment:                                                DATE: 04/08/2022 Therapeutic Exercise: NuStep L5 x 5 with UE/LE while taking subjective Slant board calf stretch 3 x 30 sec Standing calf stretch at counter x 30 sec Standing heel toe raises 3 x 20 Tandem stance 3 x 20 sec each Heel raises on edge of step 2 x 20 4-way ankle with red 2 x 20 each Seated heel raises with 25# 3 x 20 Sit to stand 2 x 15  [facility] Adult PT Treatment:                                                DATE: 04/07/2022 Therapeutic Exercise: NuStep L5 x 5 with UE/LE while taking subjective Slant board calf stretch 3 x 30 sec Standing heel toe raises 2 x 20 Tandem stance 3 x 20 sec each 4-way ankle with red 2 x  20 each Seated towel scrunches 2 x 20 Seated ankle inversion/eversion on towel 2 x 20 Toe yoga x 20  each Standing calf stretch at counter x 30 sec   PATIENT EDUCATION:  Education details: HEP Person educated: Patient Education method: Consulting civil engineer, Demonstration, Tactile cues, Verbal cues Education comprehension: verbalized understanding, returned demonstration, verbal cues required, tactile cues required, and needs further education   HOME EXERCISE PROGRAM: Access Code: [email or web address]     ASSESSMENT: CLINICAL IMPRESSION: Patient tolerated therapy well with no adverse effects. Therapy continues to focus on progressing ankle mobility and strengthening. She is tolerating progressions in resistance well and was able to perform higher level balance training for ankle stability with occasional use of UE support. No changes to HEP this visit. Patient would benefit from continued skilled PT to progress her mobility and strength in order to reduce pain and maximize functional ability.     OBJECTIVE IMPAIRMENTS Abnormal gait, decreased activity tolerance, decreased balance, decreased ROM, decreased strength, impaired flexibility, and pain.    ACTIVITY LIMITATIONS standing, stairs, and locomotion level   PARTICIPATION LIMITATIONS: meal prep, cleaning, laundry, shopping, community activity, and occupation   PERSONAL FACTORS Age, Fitness, and Time since onset of injury/illness/exacerbation are also affecting patient's functional outcome.      GOALS: Goals reviewed with patient? Yes   SHORT TERM GOALS: Target date: 04/23/2022  Patient will be I with initial HEP in order to progress with therapy. Baseline: HEP provided at eval Goal status: INITIAL   2.  PT will review FOTO with patient by 3rd visit in order to understand expected progress and outcome with therapy. Baseline: FOTO assessed at eval Goal status: INITIAL   3.  Patient will report pain with walking/standing </= 3/10 in order to reduce functional limitations Baseline: 6/10 pain Goal status: INITIAL   LONG TERM GOALS: Target date:  05/21/2022   Patient will be I with final HEP to maintain progress from PT. Baseline: HEP provided at eval Goal status: INITIAL   2.  Patient will report >/= 55% status on FOTO to indicate improved functional ability. Baseline: 40% Goal status: INITIAL   3.  Patient will demonstrate improve right ankle DF >/= 8 deg in order to improve gait and reduce pain with walking. Baseline: right ankle DF 3 deg Goal status: INITIAL   4.  Patient will exhibit right ankle PF strength >/= 4/5 MMT in order to improve standing and walking tolerance. Baseline: right ankle PF strength 2/5 MMT Goal status: INITIAL     PLAN: PT FREQUENCY: 1-2x/week   PT DURATION: 8 weeks   PLANNED INTERVENTIONS: Therapeutic exercises, Therapeutic activity, Neuromuscular re-education, Balance training, Gait training, Patient/Family education, Joint manipulation, Joint mobilization, Aquatic Therapy, Dry Needling, Electrical stimulation, Cryotherapy, Moist heat, Taping, Ultrasound, Ionotophoresis '4mg'$ /ml Dexamethasone, Manual therapy, and Re-evaluation   PLAN FOR NEXT SESSION: Review HEP and progress PRN, manual/stretching to improve ankle DF, progress calf and general ankle strength, balance training    Hilda Blades, PT, DPT, LAT, ATC 04/13/22  4:16 PM Phone: [phone] Fax: [phone]

## 2022-04-20 ENCOUNTER — Other Ambulatory Visit: Payer: Self-pay

## 2022-04-20 ENCOUNTER — Ambulatory Visit: Payer: BC Managed Care – PPO | Admitting: Physical Therapy

## 2022-04-20 ENCOUNTER — Encounter: Payer: Self-pay | Admitting: Physical Therapy

## 2022-04-20 DIAGNOSIS — M6281 Muscle weakness (generalized): Secondary | ICD-10-CM

## 2022-04-20 DIAGNOSIS — M25571 Pain in right ankle and joints of right foot: Secondary | ICD-10-CM | POA: Diagnosis not present

## 2022-04-20 DIAGNOSIS — R2689 Other abnormalities of gait and mobility: Secondary | ICD-10-CM

## 2022-04-20 NOTE — Therapy (Addendum)
OUTPATIENT PHYSICAL THERAPY TREATMENT NOTE  DISCHARGE   Patient Name: Taylor Edwards MRN: [id number] DOB:15-Feb-1941, 81 y.o., female Today's Date: 04/20/2022  PCP: Lajean Manes, MD   REFERRING PROVIDER: Wylene Simmer, MD   END OF SESSION:   PT End of Session - 04/20/22 1542     Visit Number 5    Number of Visits 12    Date for PT Re-Evaluation 05/21/22    Authorization Type BCBS / UHC MCR    Progress Note Due on Visit 10    PT Start Time 1535    PT Stop Time 1615    PT Time Calculation (min) 40 min    Activity Tolerance Patient tolerated treatment well    Behavior During Therapy WFL for tasks assessed/performed                Past Medical History:  Diagnosis Date   Anemia    no current med.   Arthritis    knees   Asthma    no current meds.   Cataract of both eyes    early   Diabetes mellitus    only taking natural supplements   GERD (gastroesophageal reflux disease)    no current med.   Hypertension    has been on med. x 10 yrs.   IBS (irritable bowel syndrome)    Post-nasal drip    current cough   Sickle cell trait (HCC)    Urinary urgency    Past Surgical History:  Procedure Laterality Date   ABDOMINAL HYSTERECTOMY     BUNIONECTOMY  08/25/2011   Procedure: [name];  Surgeon: Hessie Dibble, MD;  Location: [facility];  Service: Orthopedics;  Laterality: Right;   CATARACT EXTRACTION     both eyes   COLONOSCOPY W/ BIOPSIES AND POLYPECTOMY     DILATION AND CURETTAGE OF UTERUS     INJECTION KNEE  08/25/2011   Procedure: KNEE INJECTION;  Surgeon: Hessie Dibble, MD;  Location: [facility];  Service: Orthopedics;  Laterality: Bilateral;   JOINT REPLACEMENT     KNEE ARTHROSCOPY  12/11/2008   bilat., with chondroplasty   KNEE JOINT MANIPULATION  10/30/2009   bilat.   OPEN REDUCTION INTERNAL FIXATION (ORIF) DISTAL RADIAL FRACTURE Left 11/17/2014   Procedure: OPEN REDUCTION INTERNAL FIXATION (ORIF) LEFT DISTAL RADIUS   FRACTURE;  Surgeon: Charlotte Crumb, MD;  Location: [facility];  Service: Orthopedics;  Laterality: Left;   TOTAL KNEE ARTHROPLASTY  09/26/2009   bilat.   Patient Active Problem List   Diagnosis Date Noted   Posterior vitreous detachment, both eyes 03/26/2022   Diabetes mellitus without complication ([place]) 29/79/8921   Pseudophakia, both eyes 03/26/2022   History of total knee replacement, left 04/03/2019   History of total knee arthroplasty, right 04/03/2019   DERMATOFIBROMA 10/26/2007   ANEMIA, CHRONIC 10/26/2007   SICKLE CELL TRAIT 10/26/2007   ESSENTIAL HYPERTENSION 10/26/2007   GASTROESOPHAGEAL REFLUX DISEASE 10/26/2007   GASTRITIS 10/26/2007   HIATAL HERNIA 10/26/2007   CONSTIPATION, CHRONIC 10/26/2007   GUAIAC POSITIVE STOOL 10/26/2007    REFERRING DIAG: Achilles tendinitis, right leg  THERAPY DIAG:  Pain in right ankle and joints of right foot  Muscle weakness (generalized)  Other abnormalities of gait and mobility  Rationale for Evaluation and Treatment Rehabilitation  PERTINENT HISTORY: Bilateral TKA, DM, HTN, IBS, sickle cell trait   PRECAUTIONS: None   SUBJECTIVE: Patient reports she is still having pain in her right heel.  PAIN:  Are you having  pain? Yes:  NPRS scale: 5/10 (0/10 at rest, 6/10 with activity) Pain location: Right heel Pain description: Intermittent, sharp to stabbing Aggravating factors: Walking and standing, pressure on the heel Relieving factors: Rest, take pressure off heel  PATIENT GOALS: Improve walking ability with less pain   OBJECTIVE: (objective measures completed at initial evaluation unless otherwise dated) PATIENT SURVEYS:  FOTO 40% functional status  04/20/2022: 51%   EDEMA:  Patient does exhibit bilateral foot/ankle swelling   MUSCLE LENGTH: Impaired calf flexibility   PALPATION: Tender to palpation achilles insertion posterior heel   LOWER EXTREMITY ROM:   Active ROM Right eval Left eval Right 04/08/2022  Right 04/20/2022  Ankle dorsiflexion 3 - 5 5  Ankle plantarflexion 40 -    Ankle inversion 25 -    Ankle eversion 5 -      LOWER EXTREMITY MMT:   MMT Right eval Left eval Right 04/20/2022  Hip flexion 4 4   Hip extension 3 3   Hip abduction 3 3   Knee flexion 5 5   Knee extension 5 5   Ankle dorsiflexion 4 5 5   Ankle plantarflexion 2 3 3-  Ankle inversion 4 5 4   Ankle eversion 4 5 4     FUNCTIONAL TESTS:  SLS: unable on right   GAIT: Assistive device utilized: None Level of assistance: Complete Independence Comments: Antalgic on right with increased toe out     TODAY'S TREATMENT: [facility] Adult PT Treatment:                                                DATE: 04/20/2022 Therapeutic Exercise: NuStep L6 x 5 with UE/LE while taking subjective Slant board calf stretch 3 x 30 sec Standing heel toe raises 3 x 20 4-way ankle with green 2 x 20 each Seated heel raises with 25# with toes elevated 3 x 20 Sit to stand 3 x 10 Neuro Reeducation: Tandem stance 3 x 30 sec each Rockerboard fwd/bwd taps 3 x 20 Romberg on Airex 3 x 30 sec   [facility] Adult PT Treatment:                                                DATE: 04/13/2022 Therapeutic Exercise: NuStep L5 x 5 with UE/LE while taking subjective Slant board calf stretch 4 x 30 sec Standing heel toe raises 3 x 20 Seated heel raises with 25# 3 x 20 Sit to stand 2 x 10 4-way ankle with green 2 x 20 each Neuro Reeducation: Tandem stance 2 x 30 sec each Rockerboard fwd/bwd taps 2 x 20 Romberg on Airex with head turns and nods 2 x 10 each  [facility] Adult PT Treatment:                                                DATE: 04/08/2022 Therapeutic Exercise: NuStep L5 x 5 with UE/LE while taking subjective Slant board calf stretch 3 x 30 sec Standing calf stretch at counter x 30 sec Standing heel toe raises 3 x 20 Tandem stance 3 x 20 sec each Heel raises  on edge of step 2 x 20 4-way ankle with red 2 x 20 each Seated heel raises with 25# 3  x 20 Sit to stand 2 x 15   PATIENT EDUCATION:  Education details: HEP, FOTO Person educated: Patient Education method: Explanation, Demonstration, Tactile cues, Verbal cues Education comprehension: verbalized understanding, returned demonstration, verbal cues required, tactile cues required, and needs further education   HOME EXERCISE PROGRAM: Access Code: [email or web address]     ASSESSMENT: CLINICAL IMPRESSION: Patient tolerated therapy well with no adverse effects. She continues to exhibit limitation with her ankle DF mobility and gross ankle strength limitation. She does report improved functional ability and FOTO despite increased pain. Therapy continues to focus on progress of her ankle flexibility and strength. No changes to HEP this visit. She is going out of town for a few weeks so she will return to therapy after her trip. Patient would benefit from continued skilled PT to progress her mobility and strength in order to reduce pain and maximize functional ability.     OBJECTIVE IMPAIRMENTS Abnormal gait, decreased activity tolerance, decreased balance, decreased ROM, decreased strength, impaired flexibility, and pain.    ACTIVITY LIMITATIONS standing, stairs, and locomotion level   PARTICIPATION LIMITATIONS: meal prep, cleaning, laundry, shopping, community activity, and occupation   PERSONAL FACTORS Age, Fitness, and Time since onset of injury/illness/exacerbation are also affecting patient's functional outcome.      GOALS: Goals reviewed with patient? Yes   SHORT TERM GOALS: Target date: 04/23/2022  Patient will be I with initial HEP in order to progress with therapy. Baseline: HEP provided at eval 04/20/2022: patient reports independency Goal status: MET   2.  PT will review FOTO with patient by 3rd visit in order to understand expected progress and outcome with therapy. Baseline: FOTO assessed at eval 04/20/2022: reviewed Goal status: MET   3.  Patient will report pain with  walking/standing </= 3/10 in order to reduce functional limitations Baseline: 6/10 pain 04/20/2022: patient reports 5/10 pain Goal status: MET   LONG TERM GOALS: Target date: 05/21/2022   Patient will be I with final HEP to maintain progress from PT. Baseline: HEP provided at eval Goal status: INITIAL   2.  Patient will report >/= 55% status on FOTO to indicate improved functional ability. Baseline: 40% Goal status: INITIAL   3.  Patient will demonstrate improve right ankle DF >/= 8 deg in order to improve gait and reduce pain with walking. Baseline: right ankle DF 3 deg Goal status: INITIAL   4.  Patient will exhibit right ankle PF strength >/= 4/5 MMT in order to improve standing and walking tolerance. Baseline: right ankle PF strength 2/5 MMT Goal status: INITIAL     PLAN: PT FREQUENCY: 1-2x/week   PT DURATION: 8 weeks   PLANNED INTERVENTIONS: Therapeutic exercises, Therapeutic activity, Neuromuscular re-education, Balance training, Gait training, Patient/Family education, Joint manipulation, Joint mobilization, Aquatic Therapy, Dry Needling, Electrical stimulation, Cryotherapy, Moist heat, Taping, Ultrasound, Ionotophoresis 4mg /ml Dexamethasone, Manual therapy, and Re-evaluation   PLAN FOR NEXT SESSION: Review HEP and progress PRN, manual/stretching to improve ankle DF, progress calf and general ankle strength, balance training    Hilda Blades, PT, DPT, LAT, ATC 04/20/22  4:24 PM Phone: [phone] Fax: [phone]   PHYSICAL THERAPY DISCHARGE SUMMARY  Visits from Start of Care: 5  Current functional level related to goals / functional outcomes: See above   Remaining deficits: See above   Education / Equipment: HEP   Patient agrees to discharge. Patient goals were  not met. Patient is being discharged due to not returning since the last visit.

## 2022-04-22 ENCOUNTER — Encounter: Payer: BC Managed Care – PPO | Admitting: Physical Therapy

## 2022-04-24 DIAGNOSIS — M79671 Pain in right foot: Secondary | ICD-10-CM | POA: Diagnosis not present

## 2022-05-02 DIAGNOSIS — E119 Type 2 diabetes mellitus without complications: Secondary | ICD-10-CM | POA: Diagnosis not present

## 2022-05-02 DIAGNOSIS — R3589 Other polyuria: Secondary | ICD-10-CM | POA: Diagnosis not present

## 2022-06-17 DIAGNOSIS — I491 Atrial premature depolarization: Secondary | ICD-10-CM | POA: Diagnosis not present

## 2022-06-17 DIAGNOSIS — R002 Palpitations: Secondary | ICD-10-CM | POA: Diagnosis not present

## 2022-06-24 DIAGNOSIS — I491 Atrial premature depolarization: Secondary | ICD-10-CM | POA: Diagnosis not present

## 2022-06-24 DIAGNOSIS — R9431 Abnormal electrocardiogram [ECG] [EKG]: Secondary | ICD-10-CM | POA: Diagnosis not present

## 2022-07-09 DIAGNOSIS — L821 Other seborrheic keratosis: Secondary | ICD-10-CM | POA: Diagnosis not present

## 2022-07-09 DIAGNOSIS — R208 Other disturbances of skin sensation: Secondary | ICD-10-CM | POA: Diagnosis not present

## 2022-07-09 DIAGNOSIS — D2371 Other benign neoplasm of skin of right lower limb, including hip: Secondary | ICD-10-CM | POA: Diagnosis not present

## 2022-07-09 DIAGNOSIS — Z789 Other specified health status: Secondary | ICD-10-CM | POA: Diagnosis not present

## 2022-07-09 DIAGNOSIS — L298 Other pruritus: Secondary | ICD-10-CM | POA: Diagnosis not present

## 2022-07-10 ENCOUNTER — Other Ambulatory Visit: Payer: Self-pay | Admitting: Orthopaedic Surgery

## 2022-07-14 DIAGNOSIS — I491 Atrial premature depolarization: Secondary | ICD-10-CM | POA: Diagnosis not present

## 2022-07-14 DIAGNOSIS — R002 Palpitations: Secondary | ICD-10-CM | POA: Diagnosis not present

## 2022-07-20 DIAGNOSIS — Z789 Other specified health status: Secondary | ICD-10-CM | POA: Diagnosis not present

## 2022-07-20 DIAGNOSIS — D2371 Other benign neoplasm of skin of right lower limb, including hip: Secondary | ICD-10-CM | POA: Diagnosis not present

## 2022-07-20 DIAGNOSIS — L298 Other pruritus: Secondary | ICD-10-CM | POA: Diagnosis not present

## 2022-07-20 DIAGNOSIS — L538 Other specified erythematous conditions: Secondary | ICD-10-CM | POA: Diagnosis not present

## 2022-07-27 NOTE — Patient Instructions (Addendum)
DUE TO COVID-19 ONLY TWO VISITORS  (aged 79 and older)  ARE ALLOWED TO COME WITH YOU AND STAY IN THE WAITING ROOM ONLY DURING PRE OP AND PROCEDURE.   **NO VISITORS ARE ALLOWED IN THE SHORT STAY AREA OR RECOVERY ROOM!!**  IF YOU WILL BE ADMITTED INTO THE HOSPITAL YOU ARE ALLOWED ONLY FOUR SUPPORT PEOPLE DURING VISITATION HOURS ONLY (7 AM -8PM)   The support person(s) must pass our screening, gel in and out, and wear a mask at all times, including in the patient's room. Patients must also wear a mask when staff or their support person are in the room. Visitors GUEST BADGE MUST BE WORN VISIBLY  One adult visitor may remain with you overnight and MUST be in the room by 8 P.M.     Your procedure is scheduled on: 08/04/22   Report to [facility] Main Entrance    Report to admitting at 7:35 AM   Call this number if you have problems the morning of surgery [phone]   Do not eat food :After Midnight.   After Midnight you may have the following liquids until 6:50______ AM/  DAY OF SURGERY  Water Black Coffee (sugar ok, NO MILK/CREAM OR CREAMERS)  Tea (sugar ok, NO MILK/CREAM OR CREAMERS) regular and decaf                             Plain Jell-O (NO RED)                                           Fruit ices (not with fruit pulp, NO RED)                                     Popsicles (NO RED)                                                                  Juice: apple, WHITE grape, WHITE cranberry Sports drinks like Gatorade (NO RED)                  The day of surgery:  Drink ONE G2 at 6:50 AM the morning of surgery. Drink in one sitting. Do not sip.  This drink was given to you during your hospital  pre-op appointment visit. Nothing else to drink after completing the  G2. At 6:50 AM          If you have questions, please contact your surgeon's office  Oral Hygiene is also important to reduce your risk of infection.                                    Remember - BRUSH YOUR  TEETH THE MORNING OF SURGERY WITH YOUR REGULAR TOOTHPASTE      Take these medicines the morning of surgery with A SIP OF WATER:  Atorvastatin-Lipitor  DO NOT TAKE ANY ORAL DIABETIC MEDICATIONS DAY OF YOUR SURGERY                               You may not have any metal on your body including hair pins, jewelry, and body piercing             Do not wear make-up, lotions, powders, perfumes/cologne, or deodorant  Do not wear nail polish including gel and S&S, artificial/acrylic nails, or any other type of covering on natural nails including finger and toenails. If you have artificial nails, gel coating, etc. that needs to be removed by a nail salon please have this removed prior to surgery or surgery may need to be canceled/ delayed if the surgeon/ anesthesia feels like they are unable to be safely monitored.   Do not shave  48 hours prior to surgery.     Do not bring valuables to the hospital. [place].   Contacts, dentures or bridgework may not be worn into surgery.      Patients discharged on the day of surgery will not be allowed to drive home.  Someone NEEDS to stay with you for the first 24 hours after anesthesia.   Special Instructions: Bring a copy of your healthcare power of attorney and living will documents  the day of surgery if you haven't scanned them before.              Please read over the following fact sheets you were given: IF YOU HAVE QUESTIONS ABOUT YOUR PRE-OP INSTRUCTIONS PLEASE CALL [phone]    [facility] - Preparing for Surgery Before surgery, you can play an important role.  Because skin is not sterile, your skin needs to be as free of germs as possible.  You can reduce the number of germs on your skin by washing with CHG (chlorahexidine gluconate) soap before surgery.  CHG is an  antiseptic cleaner which kills germs and bonds with the skin to continue killing germs even after washing. Please DO NOT use if you have an allergy to CHG or antibacterial soaps.  If your skin becomes reddened/irritated stop using the CHG and inform your nurse when you arrive at Short Stay. Do not shave (including legs and underarms) for at least 48 hours prior to the first CHG shower.   Please follow these instructions carefully:  1.  Shower with CHG Soap the night before surgery and the  morning of Surgery.  2.  If you choose to wash your hair, wash your hair first as usual with your  normal  shampoo.  3.  After you shampoo, rinse your hair and body thoroughly to remove the  shampoo.                            4.  Use CHG as you would any other liquid soap.  You can apply chg directly  to the skin and wash                       Gently with a scrungie or clean washcloth.  5.  Apply the CHG Soap to your body ONLY FROM THE NECK DOWN.   Do not use on face/ open  Wound or open sores. Avoid contact with eyes, ears mouth and genitals (private parts).                       Wash face,  Genitals (private parts) with your normal soap.             6.  Wash thoroughly, paying special attention to the area where your surgery  will be performed.  7.  Thoroughly rinse your body with warm water from the neck down.  8.  DO NOT shower/wash with your normal soap after using and rinsing off  the CHG Soap.                9.  Pat yourself dry with a clean towel.            10.  Wear clean pajamas.            11.  Place clean sheets on your bed the night of your first shower and do not  sleep with pets. Day of Surgery : Do not apply any lotions/deodorants the morning of surgery.  Please wear clean clothes to the hospital/surgery center.  FAILURE TO FOLLOW THESE INSTRUCTIONS MAY RESULT IN THE CANCELLATION OF YOUR  SURGERY   ________________________________________________________________________  Incentive Spirometer  An incentive spirometer is a tool that can help keep your lungs clear and active. This tool measures how well you are filling your lungs with each breath. Taking long deep breaths may help reverse or decrease the chance of developing breathing (pulmonary) problems (especially infection) following: A long period of time when you are unable to move or be active. BEFORE THE PROCEDURE  If the spirometer includes an indicator to show your best effort, your nurse or respiratory therapist will set it to a desired goal. If possible, sit up straight or lean slightly forward. Try not to slouch. Hold the incentive spirometer in an upright position. INSTRUCTIONS FOR USE  Sit on the edge of your bed if possible, or sit up as far as you can in bed or on a chair. Hold the incentive spirometer in an upright position. Breathe out normally. Place the mouthpiece in your mouth and seal your lips tightly around it. Breathe in slowly and as deeply as possible, raising the piston or the ball toward the top of the column. Hold your breath for 3-5 seconds or for as long as possible. Allow the piston or ball to fall to the bottom of the column. Remove the mouthpiece from your mouth and breathe out normally. Rest for a few seconds and repeat Steps 1 through 7 at least 10 times every 1-2 hours when you are awake. Take your time and take a few normal breaths between deep breaths. The spirometer may include an indicator to show your best effort. Use the indicator as a goal to work toward during each repetition. After each set of 10 deep breaths, practice coughing to be sure your lungs are clear. If you have an incision (the cut made at the time of surgery), support your incision when coughing by placing a pillow or rolled up towels firmly against it. Once you are able to get out of bed, walk around indoors and cough  well. You may stop using the incentive spirometer when instructed by your caregiver.  RISKS AND COMPLICATIONS Take your time so you do not get dizzy or light-headed. If you are in pain, you may need to take or ask for pain medication before  doing incentive spirometry. It is harder to take a deep breath if you are having pain. AFTER USE Rest and breathe slowly and easily. It can be helpful to keep track of a log of your progress. Your caregiver can provide you with a simple table to help with this. If you are using the spirometer at home, follow these instructions: [place] IF:  You are having difficultly using the spirometer. You have trouble using the spirometer as often as instructed. Your pain medication is not giving enough relief while using the spirometer. You develop fever of 100.5 F (38.1 C) or higher. SEEK IMMEDIATE MEDICAL CARE IF:  You cough up bloody sputum that had not been present before. You develop fever of 102 F (38.9 C) or greater. You develop worsening pain at or near the incision site. MAKE SURE YOU:  Understand these instructions. Will watch your condition. Will get help right away if you are not doing well or get worse. Document Released: 01/18/2007 Document Revised: 11/30/2011 Document Reviewed: 03/21/2007 [facility] Patient Information 2014 [place], [place].   ________________________________________________________________________

## 2022-07-31 ENCOUNTER — Encounter (HOSPITAL_COMMUNITY)
Admission: RE | Admit: 2022-07-31 | Discharge: 2022-07-31 | Disposition: A | Discharge status: Home / Self Care | Payer: Medicare Other | Source: Ambulatory Visit | Attending: Orthopaedic Surgery | Admitting: Orthopaedic Surgery

## 2022-07-31 ENCOUNTER — Encounter (HOSPITAL_COMMUNITY): Payer: Self-pay

## 2022-07-31 ENCOUNTER — Other Ambulatory Visit: Payer: Self-pay

## 2022-07-31 DIAGNOSIS — Z01818 Encounter for other preprocedural examination: Secondary | ICD-10-CM | POA: Insufficient documentation

## 2022-07-31 DIAGNOSIS — E119 Type 2 diabetes mellitus without complications: Secondary | ICD-10-CM | POA: Diagnosis not present

## 2022-07-31 DIAGNOSIS — I491 Atrial premature depolarization: Secondary | ICD-10-CM | POA: Insufficient documentation

## 2022-07-31 LAB — CBC
HCT: 39.6 % (ref 36.0–46.0)
Hemoglobin: 12.4 g/dL (ref 12.0–15.0)
MCH: 27.9 pg (ref 26.0–34.0)
MCHC: 31.3 g/dL (ref 30.0–36.0)
MCV: 89.2 fL (ref 80.0–100.0)
Platelets: 275 10*3/uL (ref 150–400)
RBC: 4.44 MIL/uL (ref 3.87–5.11)
RDW: 13.6 % (ref 11.5–15.5)
WBC: 8.5 10*3/uL (ref 4.0–10.5)
nRBC: 0 % (ref 0.0–0.2)

## 2022-07-31 LAB — HEMOGLOBIN A1C
Hgb A1c MFr Bld: 6.5 % — ABNORMAL HIGH (ref 4.8–5.6)
Mean Plasma Glucose: 139.85 mg/dL

## 2022-07-31 LAB — BASIC METABOLIC PANEL
Anion gap: 7 (ref 5–15)
BUN: 14 mg/dL (ref 8–23)
CO2: 26 mmol/L (ref 22–32)
Calcium: 8.9 mg/dL (ref 8.9–10.3)
Chloride: 110 mmol/L (ref 98–111)
Creatinine, Ser: 1.1 mg/dL — ABNORMAL HIGH (ref 0.44–1.00)
GFR, Estimated: 51 mL/min — ABNORMAL LOW (ref >60)
Glucose, Bld: 82 mg/dL (ref 70–99)
Potassium: 3.3 mmol/L — ABNORMAL LOW (ref 3.5–5.1)
Sodium: 143 mmol/L (ref 135–145)

## 2022-07-31 LAB — GLUCOSE, CAPILLARY: Glucose-Capillary: 86 mg/dL (ref 70–99)

## 2022-07-31 NOTE — Progress Notes (Signed)
Anesthesia note:  CBG at PAT visit-86   Anesthesia review:  /No    Patient denies shortness of breath, fever, cough and chest pain at PAT appointment Pt has no SOB with regular activities.  Patient verbalized understanding of instructions that were given to them at the PAT appointment. Patient was also instructed that they will need to review over the PAT instructions again at home before surgery.yes instructions reviewed face to face at blood draw.

## 2022-07-31 NOTE — Progress Notes (Signed)
Anesthesia note:interview was by phone then Pt came later for labs  Bowel prep reminder:  None  PCP - Dr. Particia Nearing Cardiologist -none Other-   Chest x-ray - no EKG - 07/31/22-epic Stress Test - no ECHO - no Cardiac Cath - no CABG-no Pacemaker/ICD device last checked:no  Sleep Study - no CPAP -    CBG at PAT visit- Fasting Blood Sugar at home-120-270 Checks Blood Sugar __1 per week___  Blood Thinner:No Blood Thinner Instructions: Aspirin Instructions: Last Dose:  Anesthesia review: Yes /No  reason:  Patient denies shortness of breath, fever, cough and chest pain at PAT appointment No SOB report by Pt   Patient verbalized understanding of instructions that were given to them at the PAT appointment. Patient was also instructed that they will need to review over the PAT instructions again at home before surgery.yes

## 2022-08-03 NOTE — H&P (Signed)
Taylor Edwards is an 81 y.o. female.   Chief Complaint: left foot pain HPI: Taylor Edwards is in again about her foot.  She has decided she would like [place] to do this right before Thanksgiving.  She is interested in a bunionectomy like we did on the opposite side many years back.  She has pain in many shoes.    X-rays: I reviewed some films on our system dated 03/18/22.  She does have a moderate bunion with no degenerative change.  Past Medical History:  Diagnosis Date   Anemia    no current med.   Arthritis    knees   Diabetes mellitus    only taking natural supplements   GERD (gastroesophageal reflux disease)    no current med.   Hypertension    has been on med. x 10 yrs.   IBS (irritable bowel syndrome)    Post-nasal drip    current cough   Sickle cell trait (HCC)    Urinary urgency     Past Surgical History:  Procedure Laterality Date   ABDOMINAL HYSTERECTOMY  1995   BUNIONECTOMY  08/25/2011   Procedure: Taylor Edwards;  Surgeon: Hessie Dibble, MD;  Location: [facility];  Service: Orthopedics;  Laterality: Right;   CATARACT EXTRACTION  2016   both eyes   COLONOSCOPY W/ BIOPSIES AND POLYPECTOMY     DILATION AND CURETTAGE OF UTERUS     INJECTION KNEE  08/25/2011   Procedure: KNEE INJECTION;  Surgeon: Hessie Dibble, MD;  Location: [place];  Service: Orthopedics;  Laterality: Bilateral;   KNEE ARTHROSCOPY  12/11/2008   bilat., with chondroplasty   KNEE JOINT MANIPULATION  10/30/2009   bilat.   OPEN REDUCTION INTERNAL FIXATION (ORIF) DISTAL RADIAL FRACTURE Left 11/17/2014   Procedure: OPEN REDUCTION INTERNAL FIXATION (ORIF) LEFT DISTAL RADIUS  FRACTURE;  Surgeon: Charlotte Crumb, MD;  Location: [place];  Service: Orthopedics;  Laterality: Left;   TOTAL KNEE ARTHROPLASTY Bilateral 09/26/2009   bilat.    Family History  Problem Relation Age of Onset   Heart disease Mother    Diabetes Other    Hypertension Other    Breast cancer Sister 17    Colon cancer Neg Hx    Stomach cancer Neg Hx    Social History:  reports that she quit smoking about 37 years ago. Her smoking use included cigarettes. She has a 15.00 pack-year smoking history. She has never used smokeless tobacco. She reports that she does not currently use alcohol after a past usage of about 4.0 standard drinks of alcohol per week. She reports that she does not use drugs.  Allergies:  Allergies  Allergen Reactions   Morphine And Related Other (See Comments)    Burns skin, hair falls out   Anaprox [Naproxen Sodium] Other (See Comments)    Leg cramps   Aspirin     nausea    No medications prior to admission.    No results found for this or any previous visit (from the past 48 hour(s)). No results found.  Review of Systems  Musculoskeletal:  Positive for arthralgias.       Left foot  All other systems reviewed and are negative.   There were no vitals taken for this visit. Physical Exam Constitutional:      Appearance: Normal appearance.  HENT:     Head: Normocephalic and atraumatic.     Nose: Nose normal.     Mouth/Throat:     Pharynx: Oropharynx  is clear.  Eyes:     Extraocular Movements: Extraocular movements intact.  Pulmonary:     Effort: Pulmonary effort is normal.  Abdominal:     Palpations: Abdomen is soft.  Musculoskeletal:     Cervical back: Normal range of motion.     Comments: Left foot is tender over a moderate bunion.  Her overall alignment looks normal.  Sensation is intact.  On the opposite foot she has a well healed bunionectomy scar which is painless.    Skin:    General: Skin is warm and dry.  Neurological:     General: No focal deficit present.     Mental Status: She is alert and oriented to person, place, and time.  Psychiatric:        Mood and Affect: Mood normal.        Behavior: Behavior normal.        Thought Content: Thought content normal.        Judgment: Judgment normal.      Assessment/Plan Assessment:  Left  foot painful bunion  Plan: Taylor Edwards would really like her bunion addressed.  It looks as though I may be the fourth doctor to see her about this problem.  If she would like to go forward with a simple Silver bunionectomy as we did on the opposite side 10 years back that would be fine.  I reviewed risk of anesthesia and infection.  It sounds as though she would like to do this around Thanksgiving to give her a week off from work during which to recover.    Taylor Edwards Taylor Bukhari, PA-C 08/03/2022, 1:26 PM

## 2022-08-04 ENCOUNTER — Encounter (HOSPITAL_COMMUNITY)
Admission: RE | Disposition: A | Discharge status: Home / Self Care | Payer: Self-pay | Source: Home / Self Care | Attending: Orthopaedic Surgery

## 2022-08-04 ENCOUNTER — Ambulatory Visit (HOSPITAL_COMMUNITY)
Admission: RE | Admit: 2022-08-04 | Discharge: 2022-08-04 | Disposition: A | Discharge status: Home / Self Care | Payer: Medicare Other | Attending: Orthopaedic Surgery | Admitting: Orthopaedic Surgery

## 2022-08-04 ENCOUNTER — Ambulatory Visit (HOSPITAL_BASED_OUTPATIENT_CLINIC_OR_DEPARTMENT_OTHER): Payer: Medicare Other | Admitting: Anesthesiology

## 2022-08-04 ENCOUNTER — Encounter (HOSPITAL_COMMUNITY): Payer: Self-pay | Admitting: Orthopaedic Surgery

## 2022-08-04 ENCOUNTER — Ambulatory Visit (HOSPITAL_COMMUNITY): Payer: Medicare Other | Admitting: Anesthesiology

## 2022-08-04 DIAGNOSIS — Z87891 Personal history of nicotine dependence: Secondary | ICD-10-CM | POA: Insufficient documentation

## 2022-08-04 DIAGNOSIS — M21612 Bunion of left foot: Secondary | ICD-10-CM | POA: Insufficient documentation

## 2022-08-04 DIAGNOSIS — Z7984 Long term (current) use of oral hypoglycemic drugs: Secondary | ICD-10-CM

## 2022-08-04 DIAGNOSIS — M1990 Unspecified osteoarthritis, unspecified site: Secondary | ICD-10-CM | POA: Diagnosis not present

## 2022-08-04 DIAGNOSIS — I10 Essential (primary) hypertension: Secondary | ICD-10-CM

## 2022-08-04 DIAGNOSIS — N289 Disorder of kidney and ureter, unspecified: Secondary | ICD-10-CM | POA: Insufficient documentation

## 2022-08-04 DIAGNOSIS — E119 Type 2 diabetes mellitus without complications: Secondary | ICD-10-CM

## 2022-08-04 DIAGNOSIS — Z79899 Other long term (current) drug therapy: Secondary | ICD-10-CM | POA: Diagnosis not present

## 2022-08-04 DIAGNOSIS — K219 Gastro-esophageal reflux disease without esophagitis: Secondary | ICD-10-CM | POA: Diagnosis not present

## 2022-08-04 HISTORY — PX: BUNIONECTOMY: SHX129

## 2022-08-04 LAB — GLUCOSE, CAPILLARY: Glucose-Capillary: 111 mg/dL — ABNORMAL HIGH (ref 70–99)

## 2022-08-04 SURGERY — BUNIONECTOMY
Anesthesia: General | Site: Toe | Laterality: Left

## 2022-08-04 MED ORDER — OXYCODONE HCL 5 MG/5ML PO SOLN: 5.0000 mg | Freq: Once | ORAL | Status: DC | PRN

## 2022-08-04 MED ORDER — DEXAMETHASONE SODIUM PHOSPHATE 10 MG/ML IJ SOLN
INTRAMUSCULAR | Status: AC
  Filled 2022-08-04: qty 1

## 2022-08-04 MED ORDER — ACETAMINOPHEN 10 MG/ML IV SOLN
INTRAVENOUS | Status: AC
  Filled 2022-08-04: qty 100

## 2022-08-04 MED ORDER — PROPOFOL 10 MG/ML IV BOLUS
INTRAVENOUS | Status: AC
  Filled 2022-08-04: qty 20

## 2022-08-04 MED ORDER — OXYCODONE HCL 5 MG PO TABS: 5.0000 mg | ORAL_TABLET | Freq: Once | ORAL | Status: DC | PRN

## 2022-08-04 MED ORDER — ACETAMINOPHEN 160 MG/5ML PO SOLN: 1000.0000 mg | Freq: Once | ORAL | Status: DC | PRN

## 2022-08-04 MED ORDER — LIDOCAINE HCL (PF) 2 % IJ SOLN
INTRAMUSCULAR | Status: AC
  Filled 2022-08-04: qty 5

## 2022-08-04 MED ORDER — HYDROCODONE-ACETAMINOPHEN 5-325 MG PO TABS: 1.0000 | ORAL_TABLET | Freq: Four times a day (QID) | ORAL | 0 refills | Status: DC | PRN

## 2022-08-04 MED ORDER — LACTATED RINGERS IV SOLN: INTRAVENOUS | Status: DC

## 2022-08-04 MED ORDER — LIDOCAINE 2% (20 MG/ML) 5 ML SYRINGE
INTRAMUSCULAR | Status: DC | PRN
  Administered 2022-08-04: 100 mg via INTRAVENOUS

## 2022-08-04 MED ORDER — ACETAMINOPHEN 500 MG PO TABS: 1000.0000 mg | ORAL_TABLET | Freq: Once | ORAL | Status: DC | PRN

## 2022-08-04 MED ORDER — CHLORHEXIDINE GLUCONATE 0.12 % MT SOLN
15.0000 mL | Freq: Once | OROMUCOSAL | Status: AC
  Administered 2022-08-04: 15 mL via OROMUCOSAL

## 2022-08-04 MED ORDER — EPHEDRINE SULFATE-NACL 50-0.9 MG/10ML-% IV SOSY
PREFILLED_SYRINGE | INTRAVENOUS | Status: DC | PRN
  Administered 2022-08-04: 5 mg via INTRAVENOUS

## 2022-08-04 MED ORDER — ORAL CARE MOUTH RINSE: 15.0000 mL | Freq: Once | OROMUCOSAL | Status: AC

## 2022-08-04 MED ORDER — FENTANYL CITRATE PF 50 MCG/ML IJ SOSY
PREFILLED_SYRINGE | INTRAMUSCULAR | Status: AC
  Filled 2022-08-04: qty 1

## 2022-08-04 MED ORDER — ONDANSETRON HCL 4 MG/2ML IJ SOLN
INTRAMUSCULAR | Status: AC
  Filled 2022-08-04: qty 2

## 2022-08-04 MED ORDER — FENTANYL CITRATE PF 50 MCG/ML IJ SOSY
25.0000 ug | PREFILLED_SYRINGE | INTRAMUSCULAR | Status: DC | PRN
  Administered 2022-08-04: 25 ug via INTRAVENOUS
  Administered 2022-08-04: 50 ug via INTRAVENOUS

## 2022-08-04 MED ORDER — CEFAZOLIN SODIUM-DEXTROSE 2-4 GM/100ML-% IV SOLN
2.0000 g | INTRAVENOUS | Status: AC
  Administered 2022-08-04: 2 g via INTRAVENOUS
  Filled 2022-08-04: qty 100

## 2022-08-04 MED ORDER — BUPIVACAINE HCL (PF) 0.5 % IJ SOLN
INTRAMUSCULAR | Status: DC | PRN
  Administered 2022-08-04: 5 mL

## 2022-08-04 MED ORDER — ACETAMINOPHEN 10 MG/ML IV SOLN: 1000.0000 mg | Freq: Once | INTRAVENOUS | Status: DC | PRN

## 2022-08-04 MED ORDER — DEXAMETHASONE SODIUM PHOSPHATE 10 MG/ML IJ SOLN
INTRAMUSCULAR | Status: DC | PRN
  Administered 2022-08-04: 5 mg via INTRAVENOUS

## 2022-08-04 MED ORDER — FENTANYL CITRATE (PF) 100 MCG/2ML IJ SOLN
INTRAMUSCULAR | Status: AC
  Filled 2022-08-04: qty 2

## 2022-08-04 MED ORDER — ONDANSETRON HCL 4 MG/2ML IJ SOLN
INTRAMUSCULAR | Status: DC | PRN
  Administered 2022-08-04: 4 mg via INTRAVENOUS

## 2022-08-04 MED ORDER — FENTANYL CITRATE (PF) 100 MCG/2ML IJ SOLN
INTRAMUSCULAR | Status: DC | PRN
  Administered 2022-08-04: 100 ug via INTRAVENOUS

## 2022-08-04 MED ORDER — EPHEDRINE 5 MG/ML INJ
INTRAVENOUS | Status: AC
  Filled 2022-08-04: qty 5

## 2022-08-04 MED ORDER — BUPIVACAINE HCL (PF) 0.5 % IJ SOLN
INTRAMUSCULAR | Status: AC
  Filled 2022-08-04: qty 30

## 2022-08-04 MED ORDER — PROPOFOL 10 MG/ML IV BOLUS
INTRAVENOUS | Status: DC | PRN
  Administered 2022-08-04: 140 mg via INTRAVENOUS
  Administered 2022-08-04: 20 mg via INTRAVENOUS

## 2022-08-04 SURGICAL SUPPLY — 67 items
BANDAGE ESMARK 6X9 LF (GAUZE/BANDAGES/DRESSINGS) ×1 IMPLANT
BLADE AVERAGE 25X9 (BLADE) ×1 IMPLANT
BLADE SURG 15 STRL LF DISP TIS (BLADE) ×2 IMPLANT
BLADE SURG 15 STRL SS (BLADE) ×2
BNDG COHESIVE 1X5 TAN STRL LF (GAUZE/BANDAGES/DRESSINGS) IMPLANT
BNDG COHESIVE 3X5 TAN ST LF (GAUZE/BANDAGES/DRESSINGS) ×1 IMPLANT
BNDG ELASTIC 3X5.8 VLCR STR LF (GAUZE/BANDAGES/DRESSINGS) IMPLANT
BNDG ESMARK 6X9 LF (GAUZE/BANDAGES/DRESSINGS) ×1
BNDG GAUZE DERMACEA FLUFF 4 (GAUZE/BANDAGES/DRESSINGS) ×1 IMPLANT
CAP K-WIRE .045 WHT STRL (WIRE)
COVER BACK TABLE 60X90IN (DRAPES) ×1 IMPLANT
CUFF TOURN SGL QUICK 18X4 (TOURNIQUET CUFF) IMPLANT
CUFF TOURN SGL QUICK 24 (TOURNIQUET CUFF)
CUFF TOURN SGL QUICK 34 (TOURNIQUET CUFF) ×1
CUFF TRNQT CYL 24X4X16.5-23 (TOURNIQUET CUFF) IMPLANT
CUFF TRNQT CYL 34X4.125X (TOURNIQUET CUFF) IMPLANT
DRAPE EXTREMITY T 121X128X90 (DISPOSABLE) ×1 IMPLANT
DRAPE OEC MINIVIEW 54X84 (DRAPES) IMPLANT
DRAPE U-SHAPE 47X51 STRL (DRAPES) ×1 IMPLANT
DRSG EMULSION OIL 3X16 NADH (GAUZE/BANDAGES/DRESSINGS) IMPLANT
DRSG EMULSION OIL 3X3 NADH (GAUZE/BANDAGES/DRESSINGS) ×1 IMPLANT
DRSG TELFA 3X8 NADH STRL (GAUZE/BANDAGES/DRESSINGS) IMPLANT
DURAPREP 26ML APPLICATOR (WOUND CARE) ×1 IMPLANT
ELECT REM PT RETURN 15FT ADLT (MISCELLANEOUS) ×1 IMPLANT
GAUZE 4X4 16PLY ~~LOC~~+RFID DBL (SPONGE) IMPLANT
GAUZE PAD ABD 8X10 STRL (GAUZE/BANDAGES/DRESSINGS) ×1 IMPLANT
GAUZE SPONGE 4X4 12PLY STRL (GAUZE/BANDAGES/DRESSINGS) ×1 IMPLANT
GAUZE STRETCH 2X75IN STRL (MISCELLANEOUS) IMPLANT
GAUZE XEROFORM 1X8 LF (GAUZE/BANDAGES/DRESSINGS) IMPLANT
GLOVE BIO SURGEON STRL SZ8 (GLOVE) ×2 IMPLANT
GLOVE BIOGEL PI IND STRL 8 (GLOVE) ×2 IMPLANT
GOWN STRL REUS W/ TWL LRG LVL3 (GOWN DISPOSABLE) ×1 IMPLANT
GOWN STRL REUS W/ TWL XL LVL3 (GOWN DISPOSABLE) ×2 IMPLANT
GOWN STRL REUS W/TWL LRG LVL3 (GOWN DISPOSABLE) ×1
GOWN STRL REUS W/TWL XL LVL3 (GOWN DISPOSABLE) ×2
K-WIRE CAPS STERILE WHITE .045 (WIRE) IMPLANT
KIT BASIN OR (CUSTOM PROCEDURE TRAY) ×1 IMPLANT
NDL HYPO 25X1 1.5 SAFETY (NEEDLE) IMPLANT
NEEDLE HYPO 22GX1.5 SAFETY (NEEDLE) IMPLANT
NEEDLE HYPO 25X1 1.5 SAFETY (NEEDLE) IMPLANT
NS IRRIG 1000ML POUR BTL (IV SOLUTION) ×1 IMPLANT
PADDING CAST ABS COTTON 3X4 (CAST SUPPLIES) IMPLANT
PENCIL SMOKE EVACUATOR (MISCELLANEOUS) ×1 IMPLANT
SHEET MEDIUM DRAPE 40X70 STRL (DRAPES) ×1 IMPLANT
SPIKE FLUID TRANSFER (MISCELLANEOUS) IMPLANT
STOCKINETTE 6  STRL (DRAPES) ×1
STOCKINETTE 6 STRL (DRAPES) ×1 IMPLANT
STRIP CLOSURE SKIN 1/2X4 (GAUZE/BANDAGES/DRESSINGS) ×1 IMPLANT
STRIP CLOSURE SKIN 1/4X4 (GAUZE/BANDAGES/DRESSINGS) IMPLANT
SUCTION FRAZIER HANDLE 10FR (MISCELLANEOUS)
SUCTION TUBE FRAZIER 10FR DISP (MISCELLANEOUS) IMPLANT
SUT BONE WAX W31G (SUTURE) IMPLANT
SUT ETHILON 3 0 PS 1 (SUTURE) IMPLANT
SUT ETHILON 4 0 PS 2 18 (SUTURE) IMPLANT
SUT ETHILON 5 0 PS 2 18 (SUTURE) IMPLANT
SUT MNCRL AB 4-0 PS2 18 (SUTURE) ×1 IMPLANT
SUT VIC AB 0 CT1 27 (SUTURE) ×1
SUT VIC AB 0 CT1 27XBRD ANBCTR (SUTURE) ×1 IMPLANT
SUT VIC AB 2-0 SH 27 (SUTURE) ×1
SUT VIC AB 2-0 SH 27XBRD (SUTURE) ×1 IMPLANT
SUT VIC AB 4-0 P-3 18XBRD (SUTURE) IMPLANT
SUT VIC AB 4-0 P3 18 (SUTURE)
SYR BULB EAR ULCER 3OZ GRN STR (SYRINGE) ×1 IMPLANT
SYR CONTROL 10ML LL (SYRINGE) IMPLANT
TOWEL GREEN STERILE FF (TOWEL DISPOSABLE) ×2 IMPLANT
TUBING CONNECTING 10 (TUBING) ×1 IMPLANT
UNDERPAD 30X36 HEAVY ABSORB (UNDERPADS AND DIAPERS) ×1 IMPLANT

## 2022-08-04 NOTE — Anesthesia Postprocedure Evaluation (Signed)
Anesthesia Post Note  Patient: Taylor Edwards  Procedure(s) Performed: LEFT FOOT BUNIONECTOMY (Left: Toe)     Patient location during evaluation: PACU Anesthesia Type: General Level of consciousness: awake Pain management: pain level controlled Vital Signs Assessment: post-procedure vital signs reviewed and stable Respiratory status: spontaneous breathing, nonlabored ventilation and respiratory function stable Cardiovascular status: blood pressure returned to baseline and stable Postop Assessment: no apparent nausea or vomiting Anesthetic complications: no   No notable events documented.  Last Vitals:  Vitals:   08/04/22 1715 08/04/22 1745  BP: 136/78 (!) 140/78  Pulse: 73 74  Resp:    Temp:  36.6 C  SpO2: 97% 97%    Last Pain:  Vitals:   08/04/22 1800  TempSrc:   PainSc: 3                  Nilda Simmer

## 2022-08-04 NOTE — Anesthesia Preprocedure Evaluation (Addendum)
Anesthesia Evaluation  Patient identified by MRN, date of birth, ID band Patient awake    Reviewed: Allergy & Precautions, NPO status , Patient's Chart, lab work & pertinent test results  History of Anesthesia Complications Negative for: history of anesthetic complications  Airway Mallampati: III  TM Distance: >3 FB Neck ROM: Full    Dental  (+) Teeth Intact, Dental Advisory Given   Pulmonary former smoker   breath sounds clear to auscultation       Cardiovascular hypertension, Pt. on medications (-) angina (-) Past MI and (-) CHF  Rhythm:Regular     Neuro/Psych negative neurological ROS  negative psych ROS   GI/Hepatic Neg liver ROS,GERD  ,,  Endo/Other  diabetes  Lab Results      Component                Value               Date                      HGBA1C                   6.5 (H)             07/31/2022             Renal/GU Renal InsufficiencyRenal diseaseLab Results      Component                Value               Date                      CREATININE               1.10 (H)            07/31/2022                Musculoskeletal  (+) Arthritis ,    Abdominal   Peds  Hematology negative hematology ROS (+) Lab Results      Component                Value               Date                      WBC                      8.5                 07/31/2022                HGB                      12.4                07/31/2022                HCT                      39.6                07/31/2022                MCV  89.2                07/31/2022                PLT                      275                 07/31/2022              Anesthesia Other Findings   Reproductive/Obstetrics                             Anesthesia Physical Anesthesia Plan  ASA: 3  Anesthesia Plan: General   Post-op Pain Management:    Induction: Intravenous  PONV Risk Score and Plan: 3 and  Ondansetron and Dexamethasone  Airway Management Planned: LMA  Additional Equipment: None  Intra-op Plan:   Post-operative Plan: Extubation in OR  Informed Consent: I have reviewed the patients History and Physical, chart, labs and discussed the procedure including the risks, benefits and alternatives for the proposed anesthesia with the patient or authorized representative who has indicated his/her understanding and acceptance.     Dental advisory given  Plan Discussed with: CRNA  Anesthesia Plan Comments:         Anesthesia Quick Evaluation

## 2022-08-04 NOTE — Transfer of Care (Signed)
Immediate Anesthesia Transfer of Care Note  Patient: Taylor Edwards  Procedure(s) Performed: Procedure(s): LEFT FOOT BUNIONECTOMY (Left)  Patient Location: PACU  Anesthesia Type:General  Level of Consciousness: Alert, Awake, Oriented  Airway & Oxygen Therapy: Patient Spontanous Breathing  Post-op Assessment: Report given to RN  Post vital signs: Reviewed and stable  Last Vitals:  Vitals:   08/04/22 1226  BP: (!) 140/71  Pulse: 70  Resp: 17  Temp: 36.9 C  SpO2: 89%    Complications: No apparent anesthesia complications

## 2022-08-04 NOTE — Progress Notes (Signed)
Orthopedic Tech Progress Note Patient Details:  Taylor Edwards 1941-05-10 [id number]  Patient ID: [name], female   DOB: 09/16/1941, 81 y.o.   MRN: [id number]  [name] 08/04/2022, 6:07 PM Left post op shoe applied in pacu

## 2022-08-04 NOTE — Progress Notes (Signed)
Patient was informed the OR is behind and her surgery will probably be delayed till at least 1530.  Patient voiced understanding.  Went to the Waiting room to look for patients friend: Hoyle Sauer.  Brought friend to patients room, they visited a few minutes and then the friend states she will run some errands due to the surgery delay and return later.  Gave Carolyn,the patients friend, the recovery room phone number and informed her to check in about 5pm.  Hoyle Sauer voiced understanding.

## 2022-08-04 NOTE — Anesthesia Procedure Notes (Signed)
Procedure Name: LMA Insertion Date/Time: 08/04/2022 4:01 PM  Performed by: Lind Covert, CRNAPre-anesthesia Checklist: Patient identified, Emergency Drugs available, Suction available and Patient being monitored Patient Re-evaluated:Patient Re-evaluated prior to induction Oxygen Delivery Method: Circle system utilized Preoxygenation: Pre-oxygenation with 100% oxygen Induction Type: IV induction LMA: LMA inserted LMA Size: 4.0 Number of attempts: 1 Placement Confirmation: positive ETCO2 and breath sounds checked- equal and bilateral Tube secured with: Tape Dental Injury: Teeth and Oropharynx as per pre-operative assessment

## 2022-08-04 NOTE — Op Note (Unsigned)
NAMEALBIRDA, SHIEL MEDICAL RECORD NO: [id number] ACCOUNT NO: [id number] DATE OF BIRTH: 1941-04-04 FACILITY: [facility] LOCATION: [facility] PHYSICIAN: Monico Blitz. Rhona Raider, MD  Operative Report   DATE OF PROCEDURE: 08/04/2022  PREOPERATIVE DIAGNOSIS:  Left foot bunion.  POSTOPERATIVE DIAGNOSIS:  Left foot bunion.  PROCEDURE:  Left foot bunionectomy.  ANESTHESIA:  General.  ATTENDING SURGEON:  Monico Blitz. Rhona Raider, MD.  ASSISTANTLoni Dolly, PA.  INDICATIONS FOR PROCEDURE: The patient is a 81 year old woman with a long history of a painful left foot bunion.  This was persisted despite pads and wide shoes.  She is about 10 years out from a successful opposite side bunionectomy and is offered the  same procedure on the left at this point.  Informed operative consent was obtained after discussion of possible complications including, reaction to anesthesia and infection.  SUMMARY OF FINDINGS AND PROCEDURE:  Under general anesthesia through a small dorsal medial incision I performed a bunionectomy.  We used fluoroscopy to confirm adequate resection, I read these views myself.  She was closed primarily and scheduled to go  home same day.  DESCRIPTION OF PROCEDURE: The patient was taken to the OR suite where general anesthetic was applied without difficulty.  She was positioned supine and prepped and draped in normal sterile fashion.  After the administration of preoperative IV Kefzol and  appropriate time-out the left leg was elevated, exsanguinated, and tourniquet inflated about the calf.  I made a small dorsal medial incision centered at the MTP joint of the great toe with dissection down to the capsule.  I made a Y-shaped incision in  the capsule based distally and exposed the bunion subperiosteally.  I then used a saw to remove the prominence.  I used fluoroscopy to confirm adequate resection and read these views myself.  The wound was then thoroughly irrigated, followed by  reapproximation of  the capsule with 0 Vicryl in subcutaneous tissues with 2-0 undyed Vicryl.  Tourniquet was deflated and a small amount of bleeding was easily controlled with Bovie cautery and pressure.  We then reapproximated the skin with a  subcuticular stitch and Steri-Strips.  We injected some Marcaine about the incision site followed by Adaptic, dry gauze and a loose Ace wrap.  Estimated blood loss and intraoperative fluids can be obtained from anesthesia records as can accurate  tourniquet time.  DISPOSITION: The patient was taken to recovery room in stable condition.  Plans were for her to go home same day and follow up in the office in less than a week.  I will contact her by phone tonight.   PUS D: 08/04/2022 4:31:13 pm T: 08/04/2022 6:25:00 pm  JOB: [id number]/ [id number]

## 2022-08-04 NOTE — Brief Op Note (Signed)
Taylor Edwards [id number] 08/04/2022   PRE-OP DIAGNOSIS: left foot bunion  POST-OP DIAGNOSIS: same  PROCEDURE: left foot bunionectomy  ANESTHESIA: general  Hessie Dibble   Dictation #:  [id number]

## 2022-08-05 ENCOUNTER — Encounter (HOSPITAL_COMMUNITY): Payer: Self-pay | Admitting: Orthopaedic Surgery

## 2022-09-15 DIAGNOSIS — R002 Palpitations: Secondary | ICD-10-CM | POA: Diagnosis not present

## 2022-09-15 DIAGNOSIS — R519 Headache, unspecified: Secondary | ICD-10-CM | POA: Diagnosis not present

## 2022-09-15 DIAGNOSIS — E119 Type 2 diabetes mellitus without complications: Secondary | ICD-10-CM | POA: Diagnosis not present

## 2022-09-15 DIAGNOSIS — Z7984 Long term (current) use of oral hypoglycemic drugs: Secondary | ICD-10-CM | POA: Diagnosis not present

## 2022-09-15 DIAGNOSIS — R079 Chest pain, unspecified: Secondary | ICD-10-CM | POA: Diagnosis not present

## 2022-09-15 DIAGNOSIS — Z79899 Other long term (current) drug therapy: Secondary | ICD-10-CM | POA: Diagnosis not present

## 2022-09-15 DIAGNOSIS — Z885 Allergy status to narcotic agent status: Secondary | ICD-10-CM | POA: Diagnosis not present

## 2022-09-15 DIAGNOSIS — R42 Dizziness and giddiness: Secondary | ICD-10-CM | POA: Diagnosis not present

## 2022-10-02 ENCOUNTER — Other Ambulatory Visit: Payer: Self-pay

## 2022-10-02 DIAGNOSIS — E1169 Type 2 diabetes mellitus with other specified complication: Secondary | ICD-10-CM | POA: Diagnosis not present

## 2022-10-02 DIAGNOSIS — J329 Chronic sinusitis, unspecified: Secondary | ICD-10-CM | POA: Diagnosis not present

## 2022-10-02 DIAGNOSIS — N1831 Chronic kidney disease, stage 3a: Secondary | ICD-10-CM | POA: Diagnosis not present

## 2022-10-02 DIAGNOSIS — R42 Dizziness and giddiness: Secondary | ICD-10-CM | POA: Diagnosis not present

## 2022-10-02 DIAGNOSIS — I499 Cardiac arrhythmia, unspecified: Secondary | ICD-10-CM | POA: Diagnosis not present

## 2022-10-02 MED ORDER — JARDIANCE 25 MG PO TABS
25.0000 mg | ORAL_TABLET | Freq: Every day | ORAL | 0 refills | Status: AC
  Filled 2022-10-02: qty 30, 30d supply, fill #0

## 2022-10-02 MED ORDER — DOXYCYCLINE HYCLATE 100 MG PO TABS
100.0000 mg | ORAL_TABLET | Freq: Two times a day (BID) | ORAL | 0 refills | Status: DC
  Filled 2022-10-02: qty 10, 5d supply, fill #0

## 2022-10-08 DIAGNOSIS — N1831 Chronic kidney disease, stage 3a: Secondary | ICD-10-CM | POA: Diagnosis not present

## 2022-10-08 DIAGNOSIS — E1169 Type 2 diabetes mellitus with other specified complication: Secondary | ICD-10-CM | POA: Diagnosis not present

## 2022-10-09 ENCOUNTER — Other Ambulatory Visit: Payer: Self-pay

## 2022-10-12 DIAGNOSIS — I129 Hypertensive chronic kidney disease with stage 1 through stage 4 chronic kidney disease, or unspecified chronic kidney disease: Secondary | ICD-10-CM | POA: Diagnosis not present

## 2022-10-12 DIAGNOSIS — E039 Hypothyroidism, unspecified: Secondary | ICD-10-CM | POA: Diagnosis not present

## 2022-10-12 DIAGNOSIS — N1831 Chronic kidney disease, stage 3a: Secondary | ICD-10-CM | POA: Diagnosis not present

## 2022-10-12 DIAGNOSIS — M65332 Trigger finger, left middle finger: Secondary | ICD-10-CM | POA: Diagnosis not present

## 2022-10-12 DIAGNOSIS — R911 Solitary pulmonary nodule: Secondary | ICD-10-CM | POA: Diagnosis not present

## 2022-10-12 DIAGNOSIS — E1169 Type 2 diabetes mellitus with other specified complication: Secondary | ICD-10-CM | POA: Diagnosis not present

## 2022-10-12 DIAGNOSIS — E7800 Pure hypercholesterolemia, unspecified: Secondary | ICD-10-CM | POA: Diagnosis not present

## 2022-10-12 DIAGNOSIS — E1121 Type 2 diabetes mellitus with diabetic nephropathy: Secondary | ICD-10-CM | POA: Diagnosis not present

## 2022-10-15 ENCOUNTER — Encounter (HOSPITAL_COMMUNITY): Payer: Self-pay | Admitting: Emergency Medicine

## 2022-10-15 ENCOUNTER — Ambulatory Visit (HOSPITAL_COMMUNITY)
Admission: EM | Admit: 2022-10-15 | Discharge: 2022-10-15 | Disposition: A | Discharge status: Home / Self Care | Payer: Medicare Other | Attending: Family Medicine | Admitting: Family Medicine

## 2022-10-15 ENCOUNTER — Ambulatory Visit (INDEPENDENT_AMBULATORY_CARE_PROVIDER_SITE_OTHER): Payer: Medicare Other

## 2022-10-15 ENCOUNTER — Other Ambulatory Visit: Payer: Self-pay

## 2022-10-15 DIAGNOSIS — M545 Low back pain, unspecified: Secondary | ICD-10-CM | POA: Diagnosis not present

## 2022-10-15 DIAGNOSIS — W19XXXA Unspecified fall, initial encounter: Secondary | ICD-10-CM

## 2022-10-15 DIAGNOSIS — M25211 Flail joint, right shoulder: Secondary | ICD-10-CM | POA: Diagnosis not present

## 2022-10-15 DIAGNOSIS — M25561 Pain in right knee: Secondary | ICD-10-CM | POA: Diagnosis not present

## 2022-10-15 DIAGNOSIS — M48061 Spinal stenosis, lumbar region without neurogenic claudication: Secondary | ICD-10-CM | POA: Diagnosis not present

## 2022-10-15 DIAGNOSIS — M5136 Other intervertebral disc degeneration, lumbar region: Secondary | ICD-10-CM | POA: Diagnosis not present

## 2022-10-15 DIAGNOSIS — M25511 Pain in right shoulder: Secondary | ICD-10-CM

## 2022-10-15 DIAGNOSIS — M25461 Effusion, right knee: Secondary | ICD-10-CM | POA: Diagnosis not present

## 2022-10-15 DIAGNOSIS — M19011 Primary osteoarthritis, right shoulder: Secondary | ICD-10-CM | POA: Diagnosis not present

## 2022-10-15 MED ORDER — HYDROCODONE-ACETAMINOPHEN 5-325 MG PO TABS: 1.0000 | ORAL_TABLET | Freq: Four times a day (QID) | ORAL | 0 refills | Status: DC | PRN

## 2022-10-15 NOTE — ED Triage Notes (Signed)
Fell yesterday morning.  Patient has mid back pain and right knee pain (history of knee replacements).  Right shoulder pain, limited range of motion of right arm due to shoulder pain.    Patient took tylenol, but no relief.    Patient tripped over a rug, landing on right side and right arm was outstretched.

## 2022-10-15 NOTE — ED Provider Notes (Addendum)
[name]    CSN: [id number] Arrival date & time: 10/15/22  1515      History   Chief Complaint Chief Complaint  Patient presents with   Fall    HPI Taylor Edwards is a 82 y.o. female.    Fall   Here for pain in her shoulder and her right knee.  Yesterday patient tripped on a rug and fell forward onto her right arm and shoulder and her right knee.  She did hit her right side of her head, but did not have any loss of consciousness.  She notes pain on her shoulder joint and on the medial surface of her right upper arm near the shoulder.  Also has some pain on her right anterior knee.  She has had bilateral knee replacements.  Her hip was bothering her more but it is much better now.  Once an x-ray she decided that her low back was hurting her enough she needed an x-ray.  Past Medical History:  Diagnosis Date   Anemia    no current med.   Arthritis    knees   Diabetes mellitus    only taking natural supplements   GERD (gastroesophageal reflux disease)    no current med.   Hypertension    has been on med. x 10 yrs.   IBS (irritable bowel syndrome)    Post-nasal drip    current cough   Sickle cell trait (HCC)    Urinary urgency     Patient Active Problem List   Diagnosis Date Noted   Posterior vitreous detachment, both eyes 03/26/2022   Diabetes mellitus without complication ([place]) 03/00/9233   Pseudophakia, both eyes 03/26/2022   History of total knee replacement, left 04/03/2019   History of total knee arthroplasty, right 04/03/2019   DERMATOFIBROMA 10/26/2007   ANEMIA, CHRONIC 10/26/2007   SICKLE CELL TRAIT 10/26/2007   ESSENTIAL HYPERTENSION 10/26/2007   GASTROESOPHAGEAL REFLUX DISEASE 10/26/2007   GASTRITIS 10/26/2007   HIATAL HERNIA 10/26/2007   CONSTIPATION, CHRONIC 10/26/2007   GUAIAC POSITIVE STOOL 10/26/2007    Past Surgical History:  Procedure Laterality Date   ABDOMINAL HYSTERECTOMY  1995   BUNIONECTOMY  08/25/2011    Procedure: [name];  Surgeon: Hessie Dibble, MD;  Location: [facility];  Service: Orthopedics;  Laterality: Right;   BUNIONECTOMY Left 08/04/2022   Procedure: LEFT FOOT BUNIONECTOMY;  Surgeon: Melrose Nakayama, MD;  Location: [facility];  Service: Orthopedics;  Laterality: Left;   CATARACT EXTRACTION  2016   both eyes   COLONOSCOPY W/ BIOPSIES AND POLYPECTOMY     DILATION AND CURETTAGE OF UTERUS     INJECTION KNEE  08/25/2011   Procedure: KNEE INJECTION;  Surgeon: Hessie Dibble, MD;  Location: [facility];  Service: Orthopedics;  Laterality: Bilateral;   KNEE ARTHROSCOPY  12/11/2008   bilat., with chondroplasty   KNEE JOINT MANIPULATION  10/30/2009   bilat.   OPEN REDUCTION INTERNAL FIXATION (ORIF) DISTAL RADIAL FRACTURE Left 11/17/2014   Procedure: OPEN REDUCTION INTERNAL FIXATION (ORIF) LEFT DISTAL RADIUS  FRACTURE;  Surgeon: Charlotte Crumb, MD;  Location: [facility];  Service: Orthopedics;  Laterality: Left;   TOTAL KNEE ARTHROPLASTY Bilateral 09/26/2009   bilat.    OB History   No obstetric history on file.      Home Medications    Prior to Admission medications   Medication Sig Start Date End Date Taking? Authorizing Provider  HYDROcodone-acetaminophen (NORCO/VICODIN) 5-325 MG tablet Take 1 tablet by mouth  every 6 (six) hours as needed (pain). 10/15/22  Yes Barrett Henle, MD  Ascorbic Acid (VITAMIN C) 1000 MG tablet Take 1,000 mg by mouth in the morning and at bedtime.    [provider]  atorvastatin (LIPITOR) 10 MG tablet Take 10 mg by mouth at bedtime.    [provider]  clobetasol cream (TEMOVATE) 6.64 % Apply 1 Application topically 2 (two) times daily as needed (irritation). 04/10/22   [provider]  colchicine 0.6 MG tablet Take 0.6 mg by mouth 2 (two) times daily as needed (gout pain).    [provider]  docusate sodium (COLACE) 100 MG capsule Take 100 mg by mouth at bedtime.    [provider]  empagliflozin (JARDIANCE) 10 MG TABS tablet Take 10 mg by mouth daily.    [provider]  empagliflozin (JARDIANCE) 25 MG TABS tablet Take 1 tablet (25 mg total) by mouth daily. 10/02/22     Flaxseed, Linseed, (FLAXSEED OIL) 1000 MG CAPS Take 1,000 mg by mouth at bedtime.    [provider]  fluticasone (FLONASE) 50 MCG/ACT nasal spray Place 2 sprays into both nostrils daily.    [provider]  gabapentin (NEURONTIN) 100 MG capsule Take 200 mg by mouth 2 (two) times daily.    [provider]  Garlic 403 MG TABS Take 100 mg by mouth daily.    [provider]  liothyronine (CYTOMEL) 5 MCG tablet Take 5 mcg by mouth daily.    [provider]  loratadine (CLARITIN) 10 MG tablet Take 10 mg by mouth daily as needed for allergies.    [provider]  losartan (COZAAR) 50 MG tablet Take 50 mg by mouth daily.    [provider]  Multiple Vitamin (MULTIVITAMIN) tablet Take 1 tablet by mouth daily.    [provider]  senna-docusate (SENOKOT-S) 8.6-50 MG tablet Take 2 tablets by mouth at bedtime.    [provider]  torsemide (DEMADEX) 10 MG tablet Take 10 mg by mouth every evening. 07/22/22   [provider]    Family History Family History  Problem Relation Age of Onset   Heart disease Mother    Diabetes Other    Hypertension Other    Breast cancer Sister 19   Colon cancer Neg Hx    Stomach cancer Neg Hx     Social History Social History   Tobacco Use   Smoking status: Former    Packs/day: 1.00    Years: 15.00    Total pack years: 15.00    Types: Cigarettes    Quit date: 05/05/1985    Years since quitting: 37.4   Smokeless tobacco: Never  Substance Use Topics   Alcohol use: Not Currently    Alcohol/week: 4.0 standard drinks of alcohol    Types: 4 Glasses of wine per week    Comment: wkends.   Drug use: No     Allergies   Morphine and related, Anaprox [naproxen  sodium], and Aspirin   Review of Systems Review of Systems   Physical Exam Triage Vital Signs ED Triage Vitals  Enc Vitals Group     BP 10/15/22 1801 137/86     Pulse Rate 10/15/22 1801 71     Resp 10/15/22 1801 20     Temp 10/15/22 1801 98.5 F (36.9 C)     Temp Source 10/15/22 1801 Oral     SpO2 10/15/22 1801 97 %     Weight --  Height --      Head Circumference --      Peak Flow --      Pain Score 10/15/22 1758 10     Pain Loc --      Pain Edu? --      Excl. in [place]? --    No data found.  Updated Vital Signs BP 137/86 (BP Location: Left Arm) Comment (BP Location): large cuff  Pulse 71   Temp 98.5 F (36.9 C) (Oral)   Resp 20   SpO2 97%   Visual Acuity Right Eye Distance:   Left Eye Distance:   Bilateral Distance:    Right Eye Near:   Left Eye Near:    Bilateral Near:     Physical Exam Vitals reviewed.  Constitutional:      General: She is not in acute distress.    Appearance: She is not ill-appearing, toxic-appearing or diaphoretic.  HENT:     Mouth/Throat:     Mouth: Mucous membranes are moist.     Pharynx: No oropharyngeal exudate or posterior oropharyngeal erythema.  Eyes:     Extraocular Movements: Extraocular movements intact.     Conjunctiva/sclera: Conjunctivae normal.     Pupils: Pupils are equal, round, and reactive to light.  Cardiovascular:     Rate and Rhythm: Normal rate and regular rhythm.     Heart sounds: No murmur heard. Pulmonary:     Effort: Pulmonary effort is normal.     Breath sounds: Normal breath sounds.  Musculoskeletal:     Cervical back: Neck supple.     Comments: There is pain on range of motion of the right shoulder.  The wrist and hand and elbow are normal.  Lymphadenopathy:     Cervical: No cervical adenopathy.  Skin:    Coloration: Skin is not jaundiced or pale.  Neurological:     General: No focal deficit present.     Mental Status: She is alert and oriented to person, place, and time.  Psychiatric:         Behavior: Behavior normal.      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 Trace anterolisthesis L4 on L5. Mild disc space narrowing at L2-L3 and L4-L5. Aortic atherosclerosis. Facet degenerative changes of the lower lumbar spine IMPRESSION: Transitional lumbosacral anatomy. Mild degenerative changes with trace anterolisthesis L4 on L5. No acute osseous abnormality. Electronically Signed   By: Donavan Foil M.D.   On: 10/15/2022 19:03   DG Knee 2 Views Right  Result Date: 10/15/2022 CLINICAL DATA:  Knee pain after fall EXAM: RIGHT KNEE - 1-2 VIEW COMPARISON:  MRI 06/07/2019 FINDINGS: Right knee replacement with intact hardware and normal alignment. No fracture. Trace knee effusion. Soft tissue swelling anterior to the IMPRESSION: Right knee replacement without acute osseous abnormality. Trace knee effusion with swelling anterior to the knee. Electronically Signed   By: Donavan Foil M.D.   On: 10/15/2022 19:01   DG Shoulder Right  Result Date: 10/15/2022 CLINICAL DATA:  Shoulder pain EXAM: RIGHT SHOULDER - 2+ VIEW COMPARISON:  None Available. FINDINGS:  No fracture or malalignment. Mild AC joint degenerative change. Right apex is clear IMPRESSION: Mild AC joint degenerative change. Electronically Signed   By: Donavan Foil M.D.   On: 10/15/2022 18:59    Procedures Procedures (including critical care time)  Medications Ordered in UC Medications - No data to display  Initial Impression / Assessment and Plan / UC Course  I have reviewed the triage vital signs  and the nursing notes.  Pertinent labs & imaging results that were available during my care of the patient were reviewed by me and considered in my medical decision making (see chart for details).      ]  X-rays of the right knee, right shoulder, and lumbar spine do not show any acute changes.  There is trace effusion in the knee, and there are degenerative changes in all joints x-rayed  EGFR is reduced on last lab work.  She is taking just a few hydrocodone in the last year with small prescriptions.  Last 1 was filled in November.  She is given contact information for orthopedics, and a sling to help her shoulder. Final Clinical Impressions(s) / UC Diagnoses   Final diagnoses:  Acute pain of right shoulder  Acute pain of right knee  Acute low back pain, unspecified back pain laterality, unspecified whether sciatica present     Discharge Instructions      The x-rays of your right knee, right shoulder, and low back do not show any fractures.  There is arthritis in all joints x-rayed  Hydrocodone 5 mg--1 tablet every 6 hours as needed for pain.  This is best taken with food.  It can cause sleepiness or dizziness       ED Prescriptions     Medication Sig Dispense Auth. Provider   HYDROcodone-acetaminophen (NORCO/VICODIN) 5-325 MG tablet Take 1 tablet by mouth every 6 (six) hours as needed (pain). 12 tablet Kelon Easom, Gwenlyn Perking, MD      I have reviewed the PDMP during this encounter.   Barrett Henle, MD 10/15/22 Janeal Holmes, MD 10/15/22 [phone]

## 2022-10-15 NOTE — Discharge Instructions (Signed)
The x-rays of your right knee, right shoulder, and low back do not show any fractures.  There is arthritis in all joints x-rayed  Hydrocodone 5 mg--1 tablet every 6 hours as needed for pain.  This is best taken with food.  It can cause sleepiness or dizziness

## 2022-10-17 DIAGNOSIS — M25511 Pain in right shoulder: Secondary | ICD-10-CM | POA: Diagnosis not present

## 2022-10-23 ENCOUNTER — Other Ambulatory Visit: Payer: Self-pay | Admitting: Internal Medicine

## 2022-10-23 DIAGNOSIS — M25511 Pain in right shoulder: Secondary | ICD-10-CM | POA: Diagnosis not present

## 2022-10-23 DIAGNOSIS — Z1231 Encounter for screening mammogram for malignant neoplasm of breast: Secondary | ICD-10-CM

## 2022-10-30 DIAGNOSIS — M75121 Complete rotator cuff tear or rupture of right shoulder, not specified as traumatic: Secondary | ICD-10-CM | POA: Diagnosis not present

## 2022-11-02 ENCOUNTER — Other Ambulatory Visit: Payer: Self-pay | Admitting: Orthopedic Surgery

## 2022-11-10 DIAGNOSIS — B9689 Other specified bacterial agents as the cause of diseases classified elsewhere: Secondary | ICD-10-CM | POA: Diagnosis not present

## 2022-11-10 DIAGNOSIS — J208 Acute bronchitis due to other specified organisms: Secondary | ICD-10-CM | POA: Diagnosis not present

## 2022-11-25 NOTE — Progress Notes (Signed)
Spoke with Dr Bettina Gavia office scheduler regarding patient's recent bronchitis and antibiotics course. Per the scheduler, pt took 5 day course of antibiotics for bronchitins, with the last dose on 11/16/22. I reviewed the anesthesia URI guidelines with the scheduler and determined that she would need to delay surgery until 12/11/22 or 2 weeks after resolution of symptoms, whichever is later. Scheduler stated she was going to call patient to discuss rescheduling of procedure.

## 2022-12-01 ENCOUNTER — Ambulatory Visit: Payer: Medicare Other

## 2022-12-01 ENCOUNTER — Encounter (HOSPITAL_BASED_OUTPATIENT_CLINIC_OR_DEPARTMENT_OTHER): Payer: Self-pay

## 2022-12-01 ENCOUNTER — Ambulatory Visit (HOSPITAL_BASED_OUTPATIENT_CLINIC_OR_DEPARTMENT_OTHER): Admit: 2022-12-01 | Payer: Medicare Other | Admitting: Orthopedic Surgery

## 2022-12-01 SURGERY — SHOULDER ARTHROSCOPY WITH ROTATOR CUFF REPAIR AND SUBACROMIAL DECOMPRESSION
Anesthesia: Choice | Laterality: Right

## 2022-12-07 ENCOUNTER — Other Ambulatory Visit: Payer: Self-pay | Admitting: Orthopedic Surgery

## 2022-12-07 ENCOUNTER — Other Ambulatory Visit: Payer: Self-pay

## 2022-12-07 ENCOUNTER — Encounter (HOSPITAL_BASED_OUTPATIENT_CLINIC_OR_DEPARTMENT_OTHER): Payer: Self-pay | Admitting: Orthopedic Surgery

## 2022-12-14 NOTE — Anesthesia Preprocedure Evaluation (Addendum)
Anesthesia Evaluation  Patient identified by MRN, date of birth, ID band Patient awake    Reviewed: Allergy & Precautions, NPO status , Patient's Chart, lab work & pertinent test results  Airway Mallampati: II  TM Distance: >3 FB Neck ROM: Full    Dental no notable dental hx. (+) Teeth Intact, Dental Advisory Given   Pulmonary former smoker   Pulmonary exam normal breath sounds clear to auscultation       Cardiovascular hypertension, Normal cardiovascular exam Rhythm:Regular Rate:Normal     Neuro/Psych    GI/Hepatic   Endo/Other  diabetes    Renal/GU      Musculoskeletal  (+) Arthritis ,    Abdominal   Peds  Hematology  (+) Blood dyscrasia, Sickle cell trait   Anesthesia Other Findings   Reproductive/Obstetrics                             Anesthesia Physical Anesthesia Plan  ASA: 3  Anesthesia Plan: Regional and General   Post-op Pain Management: Regional block* and Minimal or no pain anticipated   Induction: Intravenous  PONV Risk Score and Plan: 3 and Treatment may vary due to age or medical condition and Ondansetron  Airway Management Planned: Oral ETT  Additional Equipment:   Intra-op Plan:   Post-operative Plan: Extubation in OR  Informed Consent: I have reviewed the patients History and Physical, chart, labs and discussed the procedure including the risks, benefits and alternatives for the proposed anesthesia with the patient or authorized representative who has indicated his/her understanding and acceptance.     Dental advisory given  Plan Discussed with:   Anesthesia Plan Comments: (R ISB)        Anesthesia Quick Evaluation

## 2022-12-15 ENCOUNTER — Encounter (HOSPITAL_BASED_OUTPATIENT_CLINIC_OR_DEPARTMENT_OTHER): Payer: Self-pay | Admitting: Orthopedic Surgery

## 2022-12-15 ENCOUNTER — Ambulatory Visit (HOSPITAL_BASED_OUTPATIENT_CLINIC_OR_DEPARTMENT_OTHER)
Admission: RE | Admit: 2022-12-15 | Discharge: 2022-12-15 | Disposition: A | Discharge status: Home / Self Care | Payer: Medicare Other | Attending: Orthopedic Surgery | Admitting: Orthopedic Surgery

## 2022-12-15 ENCOUNTER — Encounter (HOSPITAL_BASED_OUTPATIENT_CLINIC_OR_DEPARTMENT_OTHER)
Admission: RE | Disposition: A | Discharge status: Home / Self Care | Payer: Self-pay | Source: Home / Self Care | Attending: Orthopedic Surgery

## 2022-12-15 ENCOUNTER — Ambulatory Visit (HOSPITAL_BASED_OUTPATIENT_CLINIC_OR_DEPARTMENT_OTHER): Payer: Medicare Other | Admitting: Anesthesiology

## 2022-12-15 DIAGNOSIS — M75121 Complete rotator cuff tear or rupture of right shoulder, not specified as traumatic: Secondary | ICD-10-CM | POA: Diagnosis not present

## 2022-12-15 DIAGNOSIS — I10 Essential (primary) hypertension: Secondary | ICD-10-CM | POA: Insufficient documentation

## 2022-12-15 DIAGNOSIS — G8918 Other acute postprocedural pain: Secondary | ICD-10-CM | POA: Diagnosis not present

## 2022-12-15 DIAGNOSIS — Z87891 Personal history of nicotine dependence: Secondary | ICD-10-CM

## 2022-12-15 DIAGNOSIS — D573 Sickle-cell trait: Secondary | ICD-10-CM | POA: Insufficient documentation

## 2022-12-15 DIAGNOSIS — E119 Type 2 diabetes mellitus without complications: Secondary | ICD-10-CM | POA: Diagnosis not present

## 2022-12-15 DIAGNOSIS — M7541 Impingement syndrome of right shoulder: Secondary | ICD-10-CM | POA: Diagnosis not present

## 2022-12-15 DIAGNOSIS — M75101 Unspecified rotator cuff tear or rupture of right shoulder, not specified as traumatic: Secondary | ICD-10-CM

## 2022-12-15 DIAGNOSIS — M25811 Other specified joint disorders, right shoulder: Secondary | ICD-10-CM | POA: Insufficient documentation

## 2022-12-15 DIAGNOSIS — Z7984 Long term (current) use of oral hypoglycemic drugs: Secondary | ICD-10-CM | POA: Diagnosis not present

## 2022-12-15 DIAGNOSIS — S46011A Strain of muscle(s) and tendon(s) of the rotator cuff of right shoulder, initial encounter: Secondary | ICD-10-CM | POA: Diagnosis not present

## 2022-12-15 HISTORY — DX: Hypothyroidism, unspecified: E03.9

## 2022-12-15 HISTORY — PX: SHOULDER ARTHROSCOPY WITH ROTATOR CUFF REPAIR AND SUBACROMIAL DECOMPRESSION: SHX5686

## 2022-12-15 HISTORY — DX: Pure hypercholesterolemia, unspecified: E78.00

## 2022-12-15 HISTORY — DX: Gout, unspecified: M10.9

## 2022-12-15 LAB — BASIC METABOLIC PANEL
Anion gap: 8 (ref 5–15)
BUN: 11 mg/dL (ref 8–23)
CO2: 27 mmol/L (ref 22–32)
Calcium: 9.1 mg/dL (ref 8.9–10.3)
Chloride: 107 mmol/L (ref 98–111)
Creatinine, Ser: 0.96 mg/dL (ref 0.44–1.00)
GFR, Estimated: >60 mL/min (ref >60)
Glucose, Bld: 124 mg/dL — ABNORMAL HIGH (ref 70–99)
Potassium: 3.4 mmol/L — ABNORMAL LOW (ref 3.5–5.1)
Sodium: 142 mmol/L (ref 135–145)

## 2022-12-15 LAB — GLUCOSE, CAPILLARY
Glucose-Capillary: 111 mg/dL — ABNORMAL HIGH (ref 70–99)
Glucose-Capillary: 99 mg/dL (ref 70–99)

## 2022-12-15 SURGERY — SHOULDER ARTHROSCOPY WITH ROTATOR CUFF REPAIR AND SUBACROMIAL DECOMPRESSION
Anesthesia: Regional | Site: Shoulder | Laterality: Right

## 2022-12-15 MED ORDER — PHENYLEPHRINE HCL (PRESSORS) 10 MG/ML IV SOLN
INTRAVENOUS | Status: AC
  Filled 2022-12-15: qty 1

## 2022-12-15 MED ORDER — OXYCODONE HCL 5 MG PO TABS: 5.0000 mg | ORAL_TABLET | ORAL | 0 refills | Status: AC | PRN

## 2022-12-15 MED ORDER — SUGAMMADEX SODIUM 200 MG/2ML IV SOLN
INTRAVENOUS | Status: DC | PRN
  Administered 2022-12-15: 200 mg via INTRAVENOUS

## 2022-12-15 MED ORDER — PHENYLEPHRINE 80 MCG/ML (10ML) SYRINGE FOR IV PUSH (FOR BLOOD PRESSURE SUPPORT)
PREFILLED_SYRINGE | INTRAVENOUS | Status: AC
  Filled 2022-12-15: qty 10

## 2022-12-15 MED ORDER — BUPIVACAINE LIPOSOME 1.3 % IJ SUSP
INTRAMUSCULAR | Status: DC | PRN
  Administered 2022-12-15: 10 mL via PERINEURAL

## 2022-12-15 MED ORDER — ROCURONIUM BROMIDE 10 MG/ML (PF) SYRINGE
PREFILLED_SYRINGE | INTRAVENOUS | Status: AC
  Filled 2022-12-15: qty 10

## 2022-12-15 MED ORDER — ONDANSETRON HCL 4 MG/2ML IJ SOLN: 4.0000 mg | Freq: Once | INTRAMUSCULAR | Status: DC | PRN

## 2022-12-15 MED ORDER — LACTATED RINGERS IV SOLN: INTRAVENOUS | Status: DC

## 2022-12-15 MED ORDER — ACETAMINOPHEN 10 MG/ML IV SOLN: 1000.0000 mg | Freq: Once | INTRAVENOUS | Status: DC | PRN

## 2022-12-15 MED ORDER — FENTANYL CITRATE (PF) 100 MCG/2ML IJ SOLN
INTRAMUSCULAR | Status: AC
  Filled 2022-12-15: qty 2

## 2022-12-15 MED ORDER — DEXMEDETOMIDINE HCL IN NACL 80 MCG/20ML IV SOLN
INTRAVENOUS | Status: DC | PRN
  Administered 2022-12-15: 8 ug via BUCCAL

## 2022-12-15 MED ORDER — CEFAZOLIN SODIUM-DEXTROSE 2-4 GM/100ML-% IV SOLN
INTRAVENOUS | Status: AC
  Filled 2022-12-15: qty 100

## 2022-12-15 MED ORDER — FENTANYL CITRATE (PF) 100 MCG/2ML IJ SOLN: 25.0000 ug | INTRAMUSCULAR | Status: DC | PRN

## 2022-12-15 MED ORDER — PHENYLEPHRINE HCL-NACL 20-0.9 MG/250ML-% IV SOLN
INTRAVENOUS | Status: DC | PRN
  Administered 2022-12-15: 40 ug/min via INTRAVENOUS

## 2022-12-15 MED ORDER — ROCURONIUM BROMIDE 100 MG/10ML IV SOLN
INTRAVENOUS | Status: DC | PRN
  Administered 2022-12-15: 50 mg via INTRAVENOUS

## 2022-12-15 MED ORDER — BUPIVACAINE HCL (PF) 0.5 % IJ SOLN
INTRAMUSCULAR | Status: DC | PRN
  Administered 2022-12-15: 15 mL via PERINEURAL

## 2022-12-15 MED ORDER — FENTANYL CITRATE (PF) 100 MCG/2ML IJ SOLN
50.0000 ug | Freq: Once | INTRAMUSCULAR | Status: AC
  Administered 2022-12-15: 50 ug via INTRAVENOUS

## 2022-12-15 MED ORDER — LIDOCAINE 2% (20 MG/ML) 5 ML SYRINGE
INTRAMUSCULAR | Status: AC
  Filled 2022-12-15: qty 5

## 2022-12-15 MED ORDER — SODIUM CHLORIDE 0.9 % IR SOLN
Status: DC | PRN
  Administered 2022-12-15: 6000 mL

## 2022-12-15 MED ORDER — TIZANIDINE HCL 2 MG PO TABS: 2.0000 mg | ORAL_TABLET | Freq: Three times a day (TID) | ORAL | 0 refills | Status: AC | PRN

## 2022-12-15 MED ORDER — DEXAMETHASONE SODIUM PHOSPHATE 10 MG/ML IJ SOLN
INTRAMUSCULAR | Status: AC
  Filled 2022-12-15: qty 1

## 2022-12-15 MED ORDER — ALBUTEROL SULFATE HFA 108 (90 BASE) MCG/ACT IN AERS
INHALATION_SPRAY | RESPIRATORY_TRACT | Status: DC | PRN
  Administered 2022-12-15 (×2): 2 via RESPIRATORY_TRACT

## 2022-12-15 MED ORDER — PROPOFOL 10 MG/ML IV BOLUS
INTRAVENOUS | Status: DC | PRN
  Administered 2022-12-15: 150 mg via INTRAVENOUS

## 2022-12-15 MED ORDER — CEFAZOLIN SODIUM-DEXTROSE 2-4 GM/100ML-% IV SOLN
2.0000 g | INTRAVENOUS | Status: AC
  Administered 2022-12-15: 2 g via INTRAVENOUS

## 2022-12-15 MED ORDER — MIDAZOLAM HCL 2 MG/2ML IJ SOLN
INTRAMUSCULAR | Status: AC
  Filled 2022-12-15: qty 2

## 2022-12-15 MED ORDER — PHENYLEPHRINE HCL (PRESSORS) 10 MG/ML IV SOLN
INTRAVENOUS | Status: DC | PRN
  Administered 2022-12-15: 160 ug via INTRAVENOUS

## 2022-12-15 MED ORDER — LIDOCAINE HCL (CARDIAC) PF 100 MG/5ML IV SOSY
PREFILLED_SYRINGE | INTRAVENOUS | Status: DC | PRN
  Administered 2022-12-15: 100 mg via INTRAVENOUS

## 2022-12-15 MED ORDER — DEXAMETHASONE SODIUM PHOSPHATE 4 MG/ML IJ SOLN
INTRAMUSCULAR | Status: DC | PRN
  Administered 2022-12-15: 5 mg via INTRAVENOUS

## 2022-12-15 MED ORDER — ONDANSETRON HCL 4 MG/2ML IJ SOLN
INTRAMUSCULAR | Status: AC
  Filled 2022-12-15: qty 2

## 2022-12-15 MED ORDER — ONDANSETRON HCL 4 MG/2ML IJ SOLN
INTRAMUSCULAR | Status: DC | PRN
  Administered 2022-12-15: 4 mg via INTRAVENOUS

## 2022-12-15 SURGICAL SUPPLY — 60 items
AID PSTN UNV HD RSTRNT DISP (MISCELLANEOUS) ×1
ANCH SUT SWLK 19.1X4.75 VT (Anchor) ×2 IMPLANT
ANCHOR PEEK 4.75X19.1 SWLK C (Anchor) IMPLANT
ANCHOR SVLK SP PEEK 4.75BLUE#2 (Anchor) IMPLANT
APL PRP STRL LF DISP 70% ISPRP (MISCELLANEOUS) ×1
APL SKNCLS STERI-STRIP NONHPOA (GAUZE/BANDAGES/DRESSINGS)
BENZOIN TINCTURE PRP APPL 2/3 (GAUZE/BANDAGES/DRESSINGS) IMPLANT
BURR OVAL 8 FLU 4.0X13 (MISCELLANEOUS) ×1 IMPLANT
CANNULA 5.75X7 CRYSTAL CLEAR (CANNULA) ×1 IMPLANT
CANNULA TWIST IN 8.25X7CM (CANNULA) IMPLANT
CHLORAPREP W/TINT 26 (MISCELLANEOUS) ×1 IMPLANT
CUTTER BONE 4.0MM X 13CM (MISCELLANEOUS) ×1 IMPLANT
DRAPE IMP U-DRAPE 54X76 (DRAPES) ×1 IMPLANT
DRAPE INCISE IOBAN 66X45 STRL (DRAPES) ×1 IMPLANT
DRAPE POUCH INSTRU U-SHP 10X18 (DRAPES) ×1 IMPLANT
DRAPE STERI 35X30 U-POUCH (DRAPES) ×1 IMPLANT
DRAPE SURG 17X23 STRL (DRAPES) ×1 IMPLANT
DRAPE U-SHAPE 47X51 STRL (DRAPES) ×1 IMPLANT
DRAPE U-SHAPE 76X120 STRL (DRAPES) ×2 IMPLANT
ELECT REM PT RETURN 9FT ADLT (ELECTROSURGICAL) ×1
ELECTRODE REM PT RTRN 9FT ADLT (ELECTROSURGICAL) ×1 IMPLANT
GAUZE 4X4 16PLY ~~LOC~~+RFID DBL (SPONGE) IMPLANT
GAUZE PAD ABD 8X10 STRL (GAUZE/BANDAGES/DRESSINGS) ×1 IMPLANT
GAUZE SPONGE 4X4 12PLY STRL (GAUZE/BANDAGES/DRESSINGS) ×1 IMPLANT
GAUZE XEROFORM 1X8 LF (GAUZE/BANDAGES/DRESSINGS) ×1 IMPLANT
GLOVE BIO SURGEON STRL SZ7.5 (GLOVE) ×1 IMPLANT
GLOVE BIOGEL PI IND STRL 6.5 (GLOVE) ×1 IMPLANT
GLOVE BIOGEL PI IND STRL 8 (GLOVE) ×1 IMPLANT
GLOVE SURG SS PI 6.5 STRL IVOR (GLOVE) ×1 IMPLANT
GOWN STRL REUS W/ TWL LRG LVL3 (GOWN DISPOSABLE) ×2 IMPLANT
GOWN STRL REUS W/ TWL XL LVL3 (GOWN DISPOSABLE) ×1 IMPLANT
GOWN STRL REUS W/TWL LRG LVL3 (GOWN DISPOSABLE) ×2
GOWN STRL REUS W/TWL XL LVL3 (GOWN DISPOSABLE) ×1
LASSO CRESCENT QUICKPASS (SUTURE) IMPLANT
MANIFOLD NEPTUNE II (INSTRUMENTS) ×1 IMPLANT
NDL HD SCORPION MEGA LOADER (NEEDLE) IMPLANT
NS IRRIG 1000ML POUR BTL (IV SOLUTION) IMPLANT
PACK ARTHROSCOPY DSU (CUSTOM PROCEDURE TRAY) ×1 IMPLANT
PACK BASIN DAY SURGERY FS (CUSTOM PROCEDURE TRAY) ×1 IMPLANT
PROBE BIPOLAR ATHRO 135MM 90D (MISCELLANEOUS) ×1 IMPLANT
RESTRAINT HEAD UNIVERSAL NS (MISCELLANEOUS) ×1 IMPLANT
SLEEVE SCD COMPRESS KNEE MED (STOCKING) ×1 IMPLANT
SLING ARM FOAM STRAP LRG (SOFTGOODS) IMPLANT
SLING ARM FOAM STRAP MED (SOFTGOODS) IMPLANT
SPIKE FLUID TRANSFER (MISCELLANEOUS) IMPLANT
STRIP CLOSURE SKIN 1/2X4 (GAUZE/BANDAGES/DRESSINGS) IMPLANT
SUPPORT WRAP ARM LG (MISCELLANEOUS) ×1 IMPLANT
SUT ETHILON 3 0 PS 1 (SUTURE) ×1 IMPLANT
SUT FIBERWIRE #2 38 T-5 BLUE (SUTURE)
SUT PDS AB 0 CT 36 (SUTURE) IMPLANT
SUT TIGER TAPE 7 IN WHITE (SUTURE) IMPLANT
SUTURE FIBERWR #2 38 T-5 BLUE (SUTURE) IMPLANT
SUTURE TAPE 1.3 40 TPR END (SUTURE) IMPLANT
SUTURETAPE 1.3 40 TPR END (SUTURE)
TAPE FIBER 2MM 7IN #2 BLUE (SUTURE) IMPLANT
TOWEL GREEN STERILE FF (TOWEL DISPOSABLE) ×1 IMPLANT
TUBE CONNECTING 20X1/4 (TUBING) ×1 IMPLANT
TUBING ARTHROSCOPY IRRIG 16FT (MISCELLANEOUS) ×1 IMPLANT
WAND ABLATOR APOLLO I90 (BUR) IMPLANT
WATER STERILE IRR 1000ML POUR (IV SOLUTION) ×1 IMPLANT

## 2022-12-15 NOTE — Op Note (Signed)
Procedure(s): SHOULDER ARTHROSCOPY WITH ROTATOR CUFF REPAIR AND SUBACROMIAL DECOMPRESSION Procedure Note  Taylor Edwards female 82 y.o. 12/15/2022  Preoperative diagnosis:  #1 right shoulder rotator cuff tear #2 right shoulder impingement with unfavorable acromial anatomy  Postoperative diagnosis: Same  Procedure(s) and Anesthesia Type:    * SHOULDER ARTHROSCOPY WITH ROTATOR CUFF REPAIR AND SUBACROMIAL DECOMPRESSION - General  Surgeon(s) and Role:    Tania Ade, MD - Primary     Surgeon: Rhae Hammock   Assistants: Sheryle Hail PA-C [name] was present and scrubbed throughout the procedure and was essential in positioning, retraction, exposure, and closure)  Anesthesia: General endotracheal anesthesia with preoperative interscalene block given by the attending anesthesiologist      Procedure Detail  SHOULDER ARTHROSCOPY WITH ROTATOR CUFF REPAIR AND SUBACROMIAL DECOMPRESSION  Estimated Blood Loss: Min         Drains: none  Blood Given: none         Specimens: none        Complications:  * No complications entered in OR log *         Disposition: PACU - hemodynamically stable.         Condition: stable    Procedure:   INDICATIONS FOR SURGERY: The patient is 82 y.o. female who has a symptomatic full-thickness right shoulder rotator cuff tear which is failed conservative treatment.  Indicated for surgery to decrease pain and improve function.  OPERATIVE FINDINGS: Examination under anesthesia: No stiffness or instability   DESCRIPTION OF PROCEDURE: The patient was identified in preoperative  holding area where I personally marked the operative site after  verifying site, side, and procedure with the patient. An interscalene block was given by the attending anesthesiologist the holding area.  The patient was taken back to the operating room where general anesthesia was induced without complication and was placed in the beach-chair position with the  back  elevated about 60 degrees and all extremities and head and neck carefully padded and  positioned.   The right upper extremity was then prepped and  draped in a standard sterile fashion. The appropriate time-out  procedure was carried out. The patient did receive IV antibiotics  within 30 minutes of incision.   A small posterior portal incision was made and the arthroscope was introduced into the joint. An anterior portal was then established above the subscapularis using needle localization. Small cannula was placed anteriorly. Diagnostic arthroscopy was then carried out .  The subscapularis was noted to be intact.  She did have extensive partial tearing of the proximal long head biceps tendon greater than 50% of the thickness and significant tenosynovitis.  I felt this could be a pain generator for her and therefore the biceps tendon was released from the superior labrum.  The supraspinatus was noted to be torn without significant retraction.  This was lightly debrided from the undersurface.  The arthroscope was then introduced into the subacromial space a standard lateral portal was established with needle localization. The shaver was used through the lateral portal to perform extensive bursectomy. Coracoacromial ligament was examined and found to be frayed indicating chronic impingement.  The bursal surface of the rotator cuff tear was identified.  The tendon was debrided back to healthy tendon.  It was easily reducible over the tuberosity.  The tuberosity was debrided down to a bleeding surface to promote healing.  The repair was then carried out by placing 2 4.75 peek swivel lock anchors just off the articular margin preloaded with  fiber tape and Tiger tape.  These 4 suture strands were then passed evenly throughout the tear and brought over to 2 additional lateral row 4.75 peek swivel lock anchors.  The knotless mechanism from each lateral anchor was then used to add additional fixation.   The final repair was felt to be complete and watertight without significant tension on the repair.  The coracoacromial ligament was taken down off the anterior acromion with the ArthroCare exposing a moderate hooked anterior acromial spur. A high-speed bur was then used through the lateral portal to take down the anterior acromial spur from lateral to medial in a standard acromioplasty.  The acromioplasty was also viewed from the lateral portal and the bur was used as necessary to ensure that the acromion was completely flat from posterior to anterior.  The arthroscopic equipment was removed from the joint and the portals were closed with 3-0 nylon in an interrupted fashion. Sterile dressings were then applied including Xeroform 4 x 4's ABDs and tape. The patient was then allowed to awaken from general anesthesia, placed in a sling, transferred to the stretcher and taken to the recovery room in stable condition.   POSTOPERATIVE PLAN: The patient will be discharged home today and will followup in one week for suture removal and wound check.

## 2022-12-15 NOTE — Discharge Instructions (Addendum)
Discharge Instructions after Arthroscopic Shoulder Repair   A sling has been provided for you. Remain in your sling at all times. This includes sleeping in your sling.  Use ice on the shoulder intermittently over the first 48 hours after surgery.  Pain medicine has been prescribed for you.  Use your medicine liberally over the first 48 hours, and then you can begin to taper your use. You may take Extra Strength Tylenol or Tylenol only in place of the pain pills. DO NOT take ANY nonsteroidal anti-inflammatory pain medications: Advil, Motrin, Ibuprofen, Aleve, Naproxen, or Narprosyn.  You may remove your dressing after two days. If the incision sites are still moist, place a Band-Aid over the moist site(s). Change Band-Aids daily until dry.  You may shower 5 days after surgery. The incisions CANNOT get wet prior to 5 days. Simply allow the water to wash over the site and then pat dry. Do not rub the incisions. Make sure your axilla (armpit) is completely dry after showering.  Take one aspirin a day for 2 weeks after surgery, unless you have an aspirin sensitivity/ allergy or asthma.   Please call [phone] during normal business hours or [phone] after hours for any problems. Including the following:  - excessive redness of the incisions - drainage for more than 4 days - fever of more than 101.5 F  *Please note that pain medications will not be refilled after hours or on weekends.   Regional Anesthesia Blocks  1. Numbness or the inability to move the "blocked" extremity may last from 3-48 hours after placement. The length of time depends on the medication injected and your individual response to the medication. If the numbness is not going away after 48 hours, call your surgeon.  2. The extremity that is blocked will need to be protected until the numbness is gone and the  Strength has returned. Because you cannot feel it, you will need to take extra care to avoid injury. Because it may  be weak, you may have difficulty moving it or using it. You may not know what position it is in without looking at it while the block is in effect.  3. For blocks in the legs and feet, returning to weight bearing and walking needs to be done carefully. You will need to wait until the numbness is entirely gone and the strength has returned. You should be able to move your leg and foot normally before you try and bear weight or walk. You will need someone to be with you when you first try to ensure you do not fall and possibly risk injury.  4. Bruising and tenderness at the needle site are common side effects and will resolve in a few days.  5. Persistent numbness or new problems with movement should be communicated to the surgeon or the [phone] [phone]). Post Anesthesia Home Care Instructions  Activity: Get plenty of rest for the remainder of the day. A responsible individual must stay with you for 24 hours following the procedure.  For the next 24 hours, DO NOT: -Drive a car -Paediatric nurse -Drink alcoholic beverages -Take any medication unless instructed by your physician -Make any legal decisions or sign important papers.  Meals: Start with liquid foods such as gelatin or soup. Progress to regular foods as tolerated. Avoid greasy, spicy, heavy foods. If nausea and/or vomiting occur, drink only clear liquids until the nausea and/or vomiting subsides. Call your physician if vomiting continues.  Special Instructions/Symptoms: Your throat may feel dry or sore from the anesthesia or the breathing tube placed in your throat during surgery. If this causes discomfort, gargle with warm salt water. The discomfort should disappear within 24 hours.  If you had a scopolamine patch placed behind your ear for the management of post- operative nausea and/or vomiting:  1. The medication in the patch is effective for 72 hours, after which it  should be removed.  Wrap patch in a tissue and discard in the trash. Wash hands thoroughly with soap and water. 2. You may remove the patch earlier than 72 hours if you experience unpleasant side effects which may include dry mouth, dizziness or visual disturbances. 3. Avoid touching the patch. Wash your hands with soap and water after contact with the patch.    Post Anesthesia Home Care Instructions  Activity: Get plenty of rest for the remainder of the day. A responsible individual must stay with you for 24 hours following the procedure.  For the next 24 hours, DO NOT: -Drive a car -Paediatric nurse -Drink alcoholic beverages -Take any medication unless instructed by your physician -Make any legal decisions or sign important papers.  Meals: Start with liquid foods such as gelatin or soup. Progress to regular foods as tolerated. Avoid greasy, spicy, heavy foods. If nausea and/or vomiting occur, drink only clear liquids until the nausea and/or vomiting subsides. Call your physician if vomiting continues.  Special Instructions/Symptoms: Your throat may feel dry or sore from the anesthesia or the breathing tube placed in your throat during surgery. If this causes discomfort, gargle with warm salt water. The discomfort should disappear within 24 hours.  If you had a scopolamine patch placed behind your ear for the management of post- operative nausea and/or vomiting:  1. The medication in the patch is effective for 72 hours, after which it should be removed.  Wrap patch in a tissue and discard in the trash. Wash hands thoroughly with soap and water. 2. You may remove the patch earlier than 72 hours if you experience unpleasant side effects which may include dry mouth, dizziness or visual disturbances. 3. Avoid touching the patch. Wash your hands with soap and water after contact with the patch.   Information for Discharge Teaching: EXPAREL (bupivacaine liposome injectable suspension)    Your surgeon or anesthesiologist gave you EXPAREL(bupivacaine) to help control your pain after surgery.  EXPAREL is a local anesthetic that provides pain relief by numbing the tissue around the surgical site. EXPAREL is designed to release pain medication over time and can control pain for up to 72 hours. Depending on how you respond to EXPAREL, you may require less pain medication during your recovery.  Possible side effects: Temporary loss of sensation or ability to move in the area where bupivacaine was injected. Nausea, vomiting, constipation Rarely, numbness and tingling in your mouth or lips, lightheadedness, or anxiety may occur. Call your doctor right away if you think you may be experiencing any of these sensations, or if you have other questions regarding possible side effects.  Follow all other discharge instructions given to you by your surgeon or nurse. Eat a healthy diet and drink plenty of water or other fluids.  If you return to the hospital for any reason within 96 hours following the administration of EXPAREL, it is important for health care providers to know that you have received this anesthetic. A teal colored band has been placed on your arm with the date, time and amount of  EXPAREL you have received in order to alert and inform your health care providers. Please leave this armband in place for the full 96 hours following administration, and then you may remove the band.

## 2022-12-15 NOTE — Anesthesia Procedure Notes (Signed)
Anesthesia Regional Block: Interscalene brachial plexus block   Pre-Anesthetic Checklist: , timeout performed,  Correct Patient, Correct Site, Correct Laterality,  Correct Procedure, Correct Position, site marked,  Risks and benefits discussed,  Surgical consent,  Pre-op evaluation,  At surgeon's request and post-op pain management  Laterality: Upper and Right  Prep: Maximum Sterile Barrier Precautions used, chloraprep       Needles:  Injection technique: Single-shot  Needle Type: Echogenic Needle     Needle Length: 5cm  Needle Gauge: 21     Additional Needles:   Procedures:,,,, ultrasound used (permanent image in chart),,    Narrative:  Start time: 12/15/2022 1:15 PM End time: 12/15/2022 1:22 PM Injection made incrementally with aspirations every 5 mL.  Performed by: Personally  Anesthesiologist: Barnet Bulluck, MD  Additional Notes: Block assessed prior to procedure. Patient tolerated procedure well.

## 2022-12-15 NOTE — H&P (Signed)
Taylor Edwards is an 82 y.o. female.   Chief Complaint: R shoulder pain HPI: R shoulder symptomatic full thickness RCT, failed conservative treatment.  Past Medical History:  Diagnosis Date   Anemia    no current med.   Arthritis    knees   Diabetes mellitus    GERD (gastroesophageal reflux disease)    no current med.   Gout    Hypercholesteremia    Hypertension    has been on med. x 10 yrs.   Hypothyroid    IBS (irritable bowel syndrome)    Post-nasal drip    current cough   Sickle cell trait (HCC)    Urinary urgency     Past Surgical History:  Procedure Laterality Date   ABDOMINAL HYSTERECTOMY  1995   BUNIONECTOMY  08/25/2011   Procedure: [name];  Surgeon: Hessie Dibble, MD;  Location: [facility];  Service: Orthopedics;  Laterality: Right;   BUNIONECTOMY Left 08/04/2022   Procedure: LEFT FOOT BUNIONECTOMY;  Surgeon: Melrose Nakayama, MD;  Location: [facility];  Service: Orthopedics;  Laterality: Left;   CATARACT EXTRACTION  2016   both eyes   COLONOSCOPY W/ BIOPSIES AND POLYPECTOMY     DILATION AND CURETTAGE OF UTERUS     INJECTION KNEE  08/25/2011   Procedure: KNEE INJECTION;  Surgeon: Hessie Dibble, MD;  Location: [facility];  Service: Orthopedics;  Laterality: Bilateral;   KNEE ARTHROSCOPY  12/11/2008   bilat., with chondroplasty   KNEE JOINT MANIPULATION  10/30/2009   bilat.   OPEN REDUCTION INTERNAL FIXATION (ORIF) DISTAL RADIAL FRACTURE Left 11/17/2014   Procedure: OPEN REDUCTION INTERNAL FIXATION (ORIF) LEFT DISTAL RADIUS  FRACTURE;  Surgeon: Charlotte Crumb, MD;  Location: [facility];  Service: Orthopedics;  Laterality: Left;   TOTAL KNEE ARTHROPLASTY Bilateral 09/26/2009   bilat.    Family History  Problem Relation Age of Onset   Heart disease Mother    Diabetes Other    Hypertension Other    Breast cancer Sister 68   Colon cancer Neg Hx    Stomach cancer Neg Hx    Social History:  reports that she quit smoking  about 37 years ago. Her smoking use included cigarettes. She has a 15.00 pack-year smoking history. She has never used smokeless tobacco. She reports that she does not currently use alcohol after a past usage of about 4.0 standard drinks of alcohol per week. She reports that she does not use drugs.  Allergies:  Allergies  Allergen Reactions   Morphine And Related Other (See Comments)    Burns skin, hair falls out   Anaprox [Naproxen Sodium] Other (See Comments)    Leg cramps   Aspirin     nausea    Medications Prior to Admission  Medication Sig Dispense Refill   Ascorbic Acid (VITAMIN C) 1000 MG tablet Take 1,000 mg by mouth in the morning and at bedtime.     atorvastatin (LIPITOR) 10 MG tablet Take 10 mg by mouth at bedtime.     docusate sodium (COLACE) 100 MG capsule Take 100 mg by mouth at bedtime.     empagliflozin (JARDIANCE) 10 MG TABS tablet Take 10 mg by mouth daily.     empagliflozin (JARDIANCE) 25 MG TABS tablet Take 1 tablet (25 mg total) by mouth daily. 30 tablet 0   Flaxseed, Linseed, (FLAXSEED OIL) 1000 MG CAPS Take 1,000 mg by mouth at bedtime.     fluticasone (FLONASE) 50 MCG/ACT nasal spray Place 2  sprays into both nostrils daily.     gabapentin (NEURONTIN) 100 MG capsule Take 200 mg by mouth 2 (two) times daily.     Garlic [id number] MG TABS Take 100 mg by mouth daily.     HYDROcodone-acetaminophen (NORCO/VICODIN) 5-325 MG tablet Take 1 tablet by mouth every 6 (six) hours as needed (pain). 12 tablet 0   liothyronine (CYTOMEL) 5 MCG tablet Take 5 mcg by mouth daily.     loratadine (CLARITIN) 10 MG tablet Take 10 mg by mouth daily as needed for allergies.     losartan (COZAAR) 50 MG tablet Take 50 mg by mouth daily.     Multiple Vitamin (MULTIVITAMIN) tablet Take 1 tablet by mouth daily.     senna-docusate (SENOKOT-S) 8.6-50 MG tablet Take 2 tablets by mouth at bedtime.     torsemide (DEMADEX) 10 MG tablet Take 10 mg by mouth every evening.     clobetasol cream (TEMOVATE) [id number]  % Apply 1 Application topically 2 (two) times daily as needed (irritation).     colchicine 0.6 MG tablet Take 0.6 mg by mouth 2 (two) times daily as needed (gout pain).      Results for orders placed or performed during the hospital encounter of 12/15/22 (from the past 48 hour(s))  Basic metabolic panel per protocol     Status: Abnormal   Collection Time: 12/15/22 10:59 AM  Result Value Ref Range   Sodium 142 135 - 145 mmol/L   Potassium 3.4 (L) 3.5 - 5.1 mmol/L   Chloride 107 98 - 111 mmol/L   CO2 27 22 - 32 mmol/L   Glucose, Bld 124 (H) 70 - 99 mg/dL    Comment: Glucose reference range applies only to samples taken after fasting for at least 8 hours.   BUN 11 8 - 23 mg/dL   Creatinine, Ser 0.96 0.44 - 1.00 mg/dL   Calcium 9.1 8.9 - 10.3 mg/dL   GFR, Estimated >60 >60 mL/min    Comment: (NOTE) Calculated using the CKD-EPI Creatinine Equation (2021)    Anion gap 8 5 - 15    Comment: Performed at [facility] [address] [place], [place] [address]  Glucose, capillary     Status: Abnormal   Collection Time: 12/15/22 11:32 AM  Result Value Ref Range   Glucose-Capillary 111 (H) 70 - 99 mg/dL    Comment: Glucose reference range applies only to samples taken after fasting for at least 8 hours.   No results found.  Review of Systems  All other systems reviewed and are negative.   Blood pressure (!) 159/71, pulse 70, temperature 98.4 F (36.9 C), temperature source Oral, resp. rate 17, height 5\' 2"  (1.575 m), weight 94.3 kg, SpO2 99 %. Physical Exam HENT:     Head: Atraumatic.  Eyes:     Extraocular Movements: Extraocular movements intact.  Cardiovascular:     Pulses: Normal pulses.  Pulmonary:     Effort: Pulmonary effort is normal.  Musculoskeletal:     Comments: R shoulder pain with RC testing.  Skin:    General: Skin is warm and dry.  Neurological:     Mental Status: She is alert.  Psychiatric:        Mood and Affect: Mood normal.       Assessment/Plan Plan R RCR, SAD Risks / benefits of surgery discussed Consent on chart  NPO for OR Preop antibiotics   Rhae Hammock, MD 12/15/2022, 2:45 PM

## 2022-12-15 NOTE — Anesthesia Procedure Notes (Signed)
Procedure Name: Intubation Date/Time: 12/15/2022 3:04 PM  Performed by: Bufford Spikes, CRNAPre-anesthesia Checklist: Patient identified, Emergency Drugs available, Suction available and Patient being monitored Patient Re-evaluated:Patient Re-evaluated prior to induction Oxygen Delivery Method: Circle system utilized Preoxygenation: Pre-oxygenation with 100% oxygen Induction Type: IV induction Ventilation: Mask ventilation without difficulty Laryngoscope Size: Miller and 2 Grade View: Grade II Tube type: Oral Tube size: 7.0 mm Number of attempts: 1 Airway Equipment and Method: Stylet and Oral airway Placement Confirmation: ETT inserted through vocal cords under direct vision, positive ETCO2 and breath sounds checked- equal and bilateral Secured at: 22 cm Tube secured with: Tape Dental Injury: Teeth and Oropharynx as per pre-operative assessment

## 2022-12-15 NOTE — Progress Notes (Signed)
Assisted Dr. Houser with right, interscalene , ultrasound guided block. Side rails up, monitors on throughout procedure. See vital signs in flow sheet. Tolerated Procedure well. 

## 2022-12-15 NOTE — Transfer of Care (Signed)
Immediate Anesthesia Transfer of Care Note  Patient: Taylor Edwards  Procedure(s) Performed: SHOULDER ARTHROSCOPY WITH ROTATOR CUFF REPAIR AND SUBACROMIAL DECOMPRESSION (Right: Shoulder)  Patient Location: PACU  Anesthesia Type:GA combined with regional for post-op pain  Level of Consciousness: awake, alert , oriented, drowsy, and patient cooperative  Airway & Oxygen Therapy: Patient Spontanous Breathing and Patient connected to face mask oxygen  Post-op Assessment: Report given to RN and Post -op Vital signs reviewed and stable  Post vital signs: Reviewed and stable  Last Vitals:  Vitals Value Taken Time  BP    Temp    Pulse    Resp    SpO2      Last Pain:  Vitals:   12/15/22 1119  TempSrc: Oral  PainSc: 0-No pain         Complications: No notable events documented.

## 2022-12-16 ENCOUNTER — Encounter (HOSPITAL_BASED_OUTPATIENT_CLINIC_OR_DEPARTMENT_OTHER): Payer: Self-pay | Admitting: Orthopedic Surgery

## 2022-12-16 NOTE — Anesthesia Postprocedure Evaluation (Signed)
Anesthesia Post Note  Patient: DELAYSIA PAUP  Procedure(s) Performed: SHOULDER ARTHROSCOPY WITH ROTATOR CUFF REPAIR AND SUBACROMIAL DECOMPRESSION (Right: Shoulder)     Patient location during evaluation: PACU Anesthesia Type: Regional and General Level of consciousness: awake and alert Pain management: pain level controlled Vital Signs Assessment: post-procedure vital signs reviewed and stable Respiratory status: spontaneous breathing, nonlabored ventilation, respiratory function stable and patient connected to nasal cannula oxygen Cardiovascular status: blood pressure returned to baseline and stable Postop Assessment: no apparent nausea or vomiting Anesthetic complications: no  No notable events documented.  Last Vitals:  Vitals:   12/15/22 1700 12/15/22 1710  BP: (!) 154/71   Pulse: 78   Resp: 16   Temp: (!) 36.2 C   SpO2: 93% 94%    Last Pain:  Vitals:   12/15/22 1710  TempSrc:   PainSc: 0-No pain                 Barnet Blanford

## 2022-12-24 ENCOUNTER — Other Ambulatory Visit: Payer: Self-pay | Admitting: Internal Medicine

## 2022-12-24 ENCOUNTER — Ambulatory Visit
Admission: RE | Admit: 2022-12-24 | Discharge: 2022-12-24 | Disposition: A | Discharge status: Home / Self Care | Payer: Medicare Other | Source: Ambulatory Visit | Attending: Internal Medicine | Admitting: Internal Medicine

## 2022-12-24 DIAGNOSIS — R053 Chronic cough: Secondary | ICD-10-CM

## 2023-01-05 ENCOUNTER — Other Ambulatory Visit: Payer: Self-pay

## 2023-01-05 ENCOUNTER — Encounter: Payer: Self-pay | Admitting: Physical Therapy

## 2023-01-05 ENCOUNTER — Ambulatory Visit: Payer: Medicare Other | Attending: Internal Medicine | Admitting: Physical Therapy

## 2023-01-05 DIAGNOSIS — G8929 Other chronic pain: Secondary | ICD-10-CM | POA: Insufficient documentation

## 2023-01-05 DIAGNOSIS — R2689 Other abnormalities of gait and mobility: Secondary | ICD-10-CM

## 2023-01-05 DIAGNOSIS — M25571 Pain in right ankle and joints of right foot: Secondary | ICD-10-CM

## 2023-01-05 DIAGNOSIS — M6281 Muscle weakness (generalized): Secondary | ICD-10-CM | POA: Diagnosis not present

## 2023-01-05 DIAGNOSIS — M25611 Stiffness of right shoulder, not elsewhere classified: Secondary | ICD-10-CM

## 2023-01-05 DIAGNOSIS — M25511 Pain in right shoulder: Secondary | ICD-10-CM | POA: Diagnosis not present

## 2023-01-05 NOTE — Therapy (Signed)
OUTPATIENT PHYSICAL THERAPY SHOULDER EVALUATION   Patient Name: Taylor Edwards MRN: [id number] DOB:1941-01-09, 82 y.o., female Today's Date: 01/06/2023   PT End of Session - 01/06/23 0825     Visit Number 1    Number of Visits --   1-2x/week   Date for PT Re-Evaluation 03/03/23    Authorization Type [employer] MCR - FOTO    Progress Note Due on Visit 10    PT Start Time 0545    PT Stop Time 0615    PT Time Calculation (min) 30 min             Past Medical History:  Diagnosis Date   Anemia    no current med.   Arthritis    knees   Diabetes mellitus    GERD (gastroesophageal reflux disease)    no current med.   Gout    Hypercholesteremia    Hypertension    has been on med. x 10 yrs.   Hypothyroid    IBS (irritable bowel syndrome)    Post-nasal drip    current cough   Sickle cell trait    Urinary urgency    Past Surgical History:  Procedure Laterality Date   ABDOMINAL HYSTERECTOMY  1995   BUNIONECTOMY  08/25/2011   Procedure: Arbutus Leas;  Surgeon: Velna Ochs, MD;  Location: [facility];  Service: Orthopedics;  Laterality: Right;   BUNIONECTOMY Left 08/04/2022   Procedure: LEFT FOOT BUNIONECTOMY;  Surgeon: Marcene Corning, MD;  Location: [facility];  Service: Orthopedics;  Laterality: Left;   CATARACT EXTRACTION  2016   both eyes   COLONOSCOPY W/ BIOPSIES AND POLYPECTOMY     DILATION AND CURETTAGE OF UTERUS     INJECTION KNEE  08/25/2011   Procedure: KNEE INJECTION;  Surgeon: Velna Ochs, MD;  Location: [facility];  Service: Orthopedics;  Laterality: Bilateral;   KNEE ARTHROSCOPY  12/11/2008   bilat., with chondroplasty   KNEE JOINT MANIPULATION  10/30/2009   bilat.   OPEN REDUCTION INTERNAL FIXATION (ORIF) DISTAL RADIAL FRACTURE Left 11/17/2014   Procedure: OPEN REDUCTION INTERNAL FIXATION (ORIF) LEFT DISTAL RADIUS  FRACTURE;  Surgeon: Dairl Ponder, MD;  Location: [facility];  Service: Orthopedics;  Laterality: Left;    SHOULDER ARTHROSCOPY WITH ROTATOR CUFF REPAIR AND SUBACROMIAL DECOMPRESSION Right 12/15/2022   Procedure: SHOULDER ARTHROSCOPY WITH ROTATOR CUFF REPAIR AND SUBACROMIAL DECOMPRESSION;  Surgeon: Jones Broom, MD;  Location: [facility];  Service: Orthopedics;  Laterality: Right;   TOTAL KNEE ARTHROPLASTY Bilateral 09/26/2009   bilat.   Patient Active Problem List   Diagnosis Date Noted   Posterior vitreous detachment, both eyes 03/26/2022   Diabetes mellitus without complication 03/26/2022   Pseudophakia, both eyes 03/26/2022   History of total knee replacement, left 04/03/2019   History of total knee arthroplasty, right 04/03/2019   DERMATOFIBROMA 10/26/2007   ANEMIA, CHRONIC 10/26/2007   SICKLE CELL TRAIT 10/26/2007   ESSENTIAL HYPERTENSION 10/26/2007   GASTROESOPHAGEAL REFLUX DISEASE 10/26/2007   GASTRITIS 10/26/2007   HIATAL HERNIA 10/26/2007   CONSTIPATION, CHRONIC 10/26/2007   GUAIAC POSITIVE STOOL 10/26/2007    PCP: Emilio Aspen, MD  REFERRING PROVIDER: Jones Broom, MD  THERAPY DIAG:  Chronic right shoulder pain  Muscle weakness  REFERRING DIAG: R R/C repair 3/26  Rationale for Evaluation and Treatment:  Rehabilitation  SUBJECTIVE:  PERTINENT PAST HISTORY:  Anemia, sickle cell trait, DM TII, bil knee replacements        PRECAUTIONS:   R  R/C repair 3/26 supraspinatus with biceps release  OP Date: 12/15/2022  2 weeks 12/29/2022  4 weeks 01/12/2023  6 weeks 01/26/2023  8 weeks 02/09/2023  10 weeks 02/23/2023  12 weeks 03/09/2023    WEIGHT BEARING RESTRICTIONS Yes shoulder  FALLS:  Has patient fallen in last 6 months? Yes, Number of falls: 1 mechanical fall leading to R/C tear  MOI/History of condition:  Onset date: 3/26  SUBJECTIVE STATEMENT  Taylor Edwards is a 82 y.o. female who presents to clinic with chief complaint of R shoulder pain and swelling following R R/C repair on 3/26.  She has had significant pain since, but this  is improving.  She reports that she had a neck pain which shoot to her hand which started after the fall which caused her R/C tear.  She reports some numbness in C6 dermatome.  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    Red flags:  denies malaise, erythema / streaking, and chills / night sweats  Pain:  Are you having pain? 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:  none  Hand Dominance: R  Patient Goals/Specific Activities: Reduce pain, improve ROM and strength, go back to [employer]   OBJECTIVE:    GENERAL OBSERVATION:  Not wearing sling "I forgot it in the car"      SENSATION:  Light touch: Deficits R C6   PALPATION: Expected TTP R shoulder  UPPER EXTREMITY AROM:  ROM Right 01/06/2023 Left 01/06/2023  Shoulder flexion 150   Shoulder abduction    Shoulder internal rotation    Shoulder external rotation    Functional IR    Functional ER    Shoulder extension    Elbow extension    Elbow flexion     (Blank rows = not tested, N = WNL, * = concordant pain with testing)  UPPER EXTREMITY MMT:  MMT Right 01/06/2023 Left 01/06/2023  Shoulder flexion    Shoulder abduction (C5)    Shoulder ER    Shoulder IR    Middle trapezius    Lower trapezius    Shoulder extension    Grip strength    Cervical flexion (C1,C2)    Cervical S/B (C3)    Shoulder shrug (C4)    Elbow flexion (C6)    Elbow ext (C7)    Thumb ext (C8)    Finger abd (T1)    Grossly     (Blank rows = not tested, score listed is out of 5 possible points.  N = WNL, D = diminished, C = clear for gross weakness with myotome testing, * = concordant pain with testing)   UPPER EXTREMITY PROM:  PROM Right 01/06/2023 Left 01/06/2023  Shoulder flexion 90+    Shoulder abduction    Shoulder internal rotation    Shoulder external rotation 20+   Functional IR    Functional ER    Shoulder extension    Elbow extension    Elbow flexion     (Blank rows = not tested, N = WNL, * = concordant pain with testing)  PATIENT SURVEYS:  FOTO 24 -> 55    TODAY'S TREATMENT:  Creating, reviewing, and completing below HEP   PATIENT EDUCATION:  POC, diagnosis, prognosis, HEP, and outcome measures.  Pt educated via explanation,  demonstration, and handout (HEP).  Pt confirms understanding verbally.   HOME EXERCISE PROGRAM: Access Code: [email or web address] URL: [email or web address] Date: 01/05/2023 Prepared by: Alphonzo Severance  Exercises - Seated Shoulder Flexion Towel Slide at Table Top  - 1 x daily - 7 x weekly - 2 sets - 10 reps (not past 90 degrees) - Ice  - 5 x daily - 7 x weekly - 1 sets - 1 reps - 20 minutes hold  Treatment priorities   Eval (01/06/2023)        Gentle PROM working to 120 d of flexion and 30 deg of ext rotation        precautions                                  ASSESSMENT:  CLINICAL IMPRESSION: Jacci is a 82 y.o. female who presents to clinic with signs and sxs consistent with R shoulder pain and reduced ROM following R R/C repair and biceps tendon release on 3/26.  She is doing well with excellent ROM for time since surgery.  Discussed at length the importance of precautions including wearing sling and no AROM, lifting, or weight bearing.  Encouraged regular icing and using a pillow for positioning at night; pt confirms understanding.  OBJECTIVE IMPAIRMENTS: Pain, R shoulder ROM, R shoulder strength  ACTIVITY LIMITATIONS: reaching, lifting, driving  PERSONAL FACTORS: See medical history and pertinent history   REHAB POTENTIAL: Good  CLINICAL DECISION MAKING: Stable/uncomplicated  EVALUATION COMPLEXITY: Low   GOALS:   SHORT TERM GOALS: Target date: 02/03/2023  Yamila will be >75% HEP compliant to  improve carryover between sessions and facilitate independent management of condition  Evaluation (01/06/2023): ongoing Goal status: INITIAL   LONG TERM GOALS: Target date: 03/03/2023  Hopelynn will improve FOTO score to 55 as a proxy for functional improvement  Evaluation/Baseline (01/06/2023): 24 Goal status: INITIAL   2.  Davielle will demonstrate >130 degrees of active ROM in flexion to allow completion of activities involving reaching OH, not limited by pain  Evaluation/Baseline (01/06/2023): 90 degrees PROM Goal status: INITIAL   3.  Devory will achieve >=60 degrees of shoulder ER at 90 degrees of abduction to allow proper shoulder mechanics during OH movement, not limited by pain   Evaluation/Baseline (01/06/2023): 20 degrees PROM Goal status: INITIAL   4.  Joliana will be able to reach repeatedly into cabinet with >/= 3#, not limited by pain  Evaluation/Baseline (01/06/2023): limited Goal status: INITIAL    PLAN: PT FREQUENCY: 1-2x/week  PT DURATION: 8 weeks (Ending 03/03/2023)  PLANNED INTERVENTIONS: Therapeutic exercises, Aquatic therapy, Therapeutic activity, Neuro Muscular re-education, Gait training, Patient/Family education, Joint mobilization, Dry Needling, Electrical stimulation, Spinal mobilization and/or manipulation, Moist heat, Taping, Vasopneumatic device, Ionotophoresis 4mg /ml Dexamethasone, and Manual therapy   Alphonzo Severance PT, DPT 01/06/2023, 8:27 AM

## 2023-01-11 ENCOUNTER — Ambulatory Visit
Admission: RE | Admit: 2023-01-11 | Discharge: 2023-01-11 | Disposition: A | Discharge status: Home / Self Care | Payer: Medicare Other | Source: Ambulatory Visit | Attending: Internal Medicine | Admitting: Internal Medicine

## 2023-01-11 DIAGNOSIS — Z1231 Encounter for screening mammogram for malignant neoplasm of breast: Secondary | ICD-10-CM

## 2023-01-18 DIAGNOSIS — I129 Hypertensive chronic kidney disease with stage 1 through stage 4 chronic kidney disease, or unspecified chronic kidney disease: Secondary | ICD-10-CM | POA: Diagnosis not present

## 2023-01-18 DIAGNOSIS — E1169 Type 2 diabetes mellitus with other specified complication: Secondary | ICD-10-CM | POA: Diagnosis not present

## 2023-01-18 DIAGNOSIS — E1122 Type 2 diabetes mellitus with diabetic chronic kidney disease: Secondary | ICD-10-CM | POA: Diagnosis not present

## 2023-01-18 DIAGNOSIS — M4802 Spinal stenosis, cervical region: Secondary | ICD-10-CM | POA: Diagnosis not present

## 2023-01-18 DIAGNOSIS — R911 Solitary pulmonary nodule: Secondary | ICD-10-CM | POA: Diagnosis not present

## 2023-01-18 DIAGNOSIS — I700 Atherosclerosis of aorta: Secondary | ICD-10-CM | POA: Diagnosis not present

## 2023-01-18 DIAGNOSIS — R5382 Chronic fatigue, unspecified: Secondary | ICD-10-CM | POA: Diagnosis not present

## 2023-01-18 DIAGNOSIS — N1831 Chronic kidney disease, stage 3a: Secondary | ICD-10-CM | POA: Diagnosis not present

## 2023-01-18 DIAGNOSIS — E039 Hypothyroidism, unspecified: Secondary | ICD-10-CM | POA: Diagnosis not present

## 2023-01-23 ENCOUNTER — Encounter: Payer: Self-pay | Admitting: Physical Therapy

## 2023-01-23 ENCOUNTER — Ambulatory Visit: Payer: Medicare Other | Attending: Internal Medicine | Admitting: Physical Therapy

## 2023-01-23 DIAGNOSIS — M6281 Muscle weakness (generalized): Secondary | ICD-10-CM

## 2023-01-23 DIAGNOSIS — M25511 Pain in right shoulder: Secondary | ICD-10-CM | POA: Insufficient documentation

## 2023-01-23 DIAGNOSIS — M25571 Pain in right ankle and joints of right foot: Secondary | ICD-10-CM | POA: Insufficient documentation

## 2023-01-23 DIAGNOSIS — G8929 Other chronic pain: Secondary | ICD-10-CM | POA: Diagnosis not present

## 2023-01-23 NOTE — Therapy (Signed)
OUTPATIENT PHYSICAL THERAPY TREATMENT NOTE   Patient Name: Taylor Edwards MRN: [id number] DOB:09/27/1940, 82 y.o., female Today's Date: 01/23/2023  PCP: Emilio Aspen, MD   REFERRING PROVIDER: Jones Broom, MD   PT End of Session - 01/23/23 1113     Visit Number 2    Number of Visits --   1-2x/week   Date for PT Re-Evaluation 03/03/23    Authorization Type [employer] MCR - FOTO    Progress Note Due on Visit 10    PT Start Time 1115    PT Stop Time 1145    PT Time Calculation (min) 30 min             Past Medical History:  Diagnosis Date   Anemia    no current med.   Arthritis    knees   Diabetes mellitus    GERD (gastroesophageal reflux disease)    no current med.   Gout    Hypercholesteremia    Hypertension    has been on med. x 10 yrs.   Hypothyroid    IBS (irritable bowel syndrome)    Post-nasal drip    current cough   Sickle cell trait (HCC)    Urinary urgency    Past Surgical History:  Procedure Laterality Date   ABDOMINAL HYSTERECTOMY  1995   BUNIONECTOMY  08/25/2011   Procedure: Arbutus Leas;  Surgeon: Velna Ochs, MD;  Location: [facility];  Service: Orthopedics;  Laterality: Right;   BUNIONECTOMY Left 08/04/2022   Procedure: LEFT FOOT BUNIONECTOMY;  Surgeon: Marcene Corning, MD;  Location: [facility];  Service: Orthopedics;  Laterality: Left;   CATARACT EXTRACTION  2016   both eyes   COLONOSCOPY W/ BIOPSIES AND POLYPECTOMY     DILATION AND CURETTAGE OF UTERUS     INJECTION KNEE  08/25/2011   Procedure: KNEE INJECTION;  Surgeon: Velna Ochs, MD;  Location: [facility];  Service: Orthopedics;  Laterality: Bilateral;   KNEE ARTHROSCOPY  12/11/2008   bilat., with chondroplasty   KNEE JOINT MANIPULATION  10/30/2009   bilat.   OPEN REDUCTION INTERNAL FIXATION (ORIF) DISTAL RADIAL FRACTURE Left 11/17/2014   Procedure: OPEN REDUCTION INTERNAL FIXATION (ORIF) LEFT DISTAL RADIUS  FRACTURE;  Surgeon: Dairl Ponder, MD;  Location: [facility];  Service: Orthopedics;  Laterality: Left;   SHOULDER ARTHROSCOPY WITH ROTATOR CUFF REPAIR AND SUBACROMIAL DECOMPRESSION Right 12/15/2022   Procedure: SHOULDER ARTHROSCOPY WITH ROTATOR CUFF REPAIR AND SUBACROMIAL DECOMPRESSION;  Surgeon: Jones Broom, MD;  Location: [facility];  Service: Orthopedics;  Laterality: Right;   TOTAL KNEE ARTHROPLASTY Bilateral 09/26/2009   bilat.   Patient Active Problem List   Diagnosis Date Noted   Posterior vitreous detachment, both eyes 03/26/2022   Diabetes mellitus without complication (HCC) 03/26/2022   Pseudophakia, both eyes 03/26/2022   History of total knee replacement, left 04/03/2019   History of total knee arthroplasty, right 04/03/2019   DERMATOFIBROMA 10/26/2007   ANEMIA, CHRONIC 10/26/2007   SICKLE CELL TRAIT 10/26/2007   ESSENTIAL HYPERTENSION 10/26/2007   GASTROESOPHAGEAL REFLUX DISEASE 10/26/2007   GASTRITIS 10/26/2007   HIATAL HERNIA 10/26/2007   CONSTIPATION, CHRONIC 10/26/2007   GUAIAC POSITIVE STOOL 10/26/2007    THERAPY DIAG:  Chronic right shoulder pain  Muscle weakness  Pain in right ankle and joints of right foot  Muscle weakness (generalized)   Rationale for Evaluation and Treatment: Rehabilitation  REFERRING DIAG: R R/C repair 3/26   PERTINENT HISTORY: Anemia, sickle cell trait, DM TII,  bil knee replacements   PRECAUTIONS/RESTRICTIONS:   R R/C repair 3/26 supraspinatus with biceps release   OP Date: 12/15/2022  2 weeks 12/29/2022  4 weeks 01/12/2023  6 weeks 01/26/2023  8 weeks 02/09/2023  10 weeks 02/23/2023  12 weeks 03/09/2023     SUBJECTIVE:  Pt reports that she has not been very compliant with her sling.  She says she "doesn't have to reach very much".  She reports a constant pain in her R shoulder which is worse with movement.  Pain:  Are you having pain? Yes Pain location: R shoulder NPRS scale:  5/10 to 10/10 Aggravating factors: movement Relieving  factors: rest, ice Pain description: aching Stage: Acute Stability: getting better 24 hour pattern: worse with movement   OBJECTIVE: (objective measures completed at initial evaluation unless otherwise dated)   GENERAL OBSERVATION:          Not wearing sling "I forgot it in the car"                                SENSATION:          Light touch: Deficits R C6           PALPATION: Expected TTP R shoulder   UPPER EXTREMITY AROM:   ROM Right 01/06/2023 Left 01/06/2023  Shoulder flexion 150    Shoulder abduction      Shoulder internal rotation      Shoulder external rotation      Functional IR      Functional ER      Shoulder extension      Elbow extension      Elbow flexion        (Blank rows = not tested, N = WNL, * = concordant pain with testing)   UPPER EXTREMITY MMT:   MMT Right 01/06/2023 Left 01/06/2023  Shoulder flexion      Shoulder abduction (C5)      Shoulder ER      Shoulder IR      Middle trapezius      Lower trapezius      Shoulder extension      Grip strength      Cervical flexion (C1,C2)      Cervical S/B (C3)      Shoulder shrug (C4)      Elbow flexion (C6)      Elbow ext (C7)      Thumb ext (C8)      Finger abd (T1)      Grossly        (Blank rows = not tested, score listed is out of 5 possible points.  N = WNL, D = diminished, C = clear for gross weakness with myotome testing, * = concordant pain with testing)     UPPER EXTREMITY PROM:   PROM Right 01/06/2023 Left 01/06/2023 R 01/23/23  Shoulder flexion 90+   120  Shoulder abduction       Shoulder internal rotation       Shoulder external rotation 20+   30+  Functional IR       Functional ER       Shoulder extension       Elbow extension       Elbow flexion         (Blank rows = not tested, N = WNL, * = concordant pain with testing)   PATIENT SURVEYS:  FOTO 24 -> 55  TODAY'S TREATMENT:  Creating, reviewing, and completing below HEP     PATIENT EDUCATION:  POC,  diagnosis, prognosis, HEP, and outcome measures.  Pt educated via explanation, demonstration, and handout (HEP).  Pt confirms understanding verbally.    HOME EXERCISE PROGRAM: Access Code: [email or web address] URL: [email or web address] Date: 01/05/2023 Prepared by: Alphonzo Severance   Exercises - Seated Shoulder Flexion Towel Slide at Table Top  - 1 x daily - 7 x weekly - 2 sets - 10 reps (not past 90 degrees) - Ice  - 5 x daily - 7 x weekly - 1 sets - 1 reps - 20 minutes hold   Treatment priorities     Eval (01/06/2023)              Gentle PROM working to 120 d of flexion and 30 deg of ext rotation              precautions                                                                TREATMENT 5/4:  Therapeutic Exercise: - seated scapular retraction   Manual Therapy: - PROM with gentle oscillations at end range for ER and scaption   ASSESSMENT:   CLINICAL IMPRESSION: Bonita Quin tolerated session well with no adverse reaction.  Pt was initially significantly guarded with PROM but relaxed significantly during session.  I reiterated multiple times the importance of following precautions and allowing repair to heal; she confirms understanding.  PROM is excellent at this point.   OBJECTIVE IMPAIRMENTS: Pain, R shoulder ROM, R shoulder strength   ACTIVITY LIMITATIONS: reaching, lifting, driving   PERSONAL FACTORS: See medical history and pertinent history     REHAB POTENTIAL: Good   CLINICAL DECISION MAKING: Stable/uncomplicated   EVALUATION COMPLEXITY: Low     GOALS:     SHORT TERM GOALS: Target date: 02/03/2023   Meline will be >75% HEP compliant to improve carryover between sessions and facilitate independent management of condition   Evaluation (01/06/2023): ongoing Goal status: INITIAL     LONG TERM GOALS: Target date: 03/03/2023   Ailee will improve FOTO score to 55 as a proxy for functional improvement   Evaluation/Baseline (01/06/2023): 24 Goal status:  INITIAL     2.  Segen will demonstrate >130 degrees of active ROM in flexion to allow completion of activities involving reaching OH, not limited by pain   Evaluation/Baseline (01/06/2023): 90 degrees PROM Goal status: INITIAL     3.  Samina will achieve >=60 degrees of shoulder ER at 90 degrees of abduction to allow proper shoulder mechanics during OH movement, not limited by pain    Evaluation/Baseline (01/06/2023): 20 degrees PROM Goal status: INITIAL     4.  Shaely will be able to reach repeatedly into cabinet with >/= 3#, not limited by pain   Evaluation/Baseline (01/06/2023): limited Goal status: INITIAL       PLAN: PT FREQUENCY: 1-2x/week   PT DURATION: 8 weeks (Ending 03/03/2023)   PLANNED INTERVENTIONS: Therapeutic exercises, Aquatic therapy, Therapeutic activity, Neuro Muscular re-education, Gait training, Patient/Family education, Joint mobilization, Dry Needling, Electrical stimulation, Spinal mobilization and/or manipulation, Moist heat, Taping, Vasopneumatic device, Ionotophoresis 4mg /ml Dexamethasone, and Manual therapy   Kimberlee Nearing Catha Ontko PT 01/23/2023, 11:51 AM

## 2023-01-26 ENCOUNTER — Ambulatory Visit: Payer: Medicare Other | Admitting: Physical Therapy

## 2023-02-01 NOTE — Therapy (Unsigned)
OUTPATIENT PHYSICAL THERAPY TREATMENT NOTE   Patient Name: Taylor Edwards MRN: [id number] DOB:[birth year], 82 y.o., female Today's Date: 02/02/2023  PCP: Emilio Aspen, MD   REFERRING PROVIDER: Jones Broom, MD   PT End of Session - 02/02/23 1345     Visit Number 3    Date for PT Re-Evaluation 03/03/23    Authorization Type [employer] MCR - FOTO    Progress Note Due on Visit 10    PT Start Time 1345    PT Stop Time 1430    PT Time Calculation (min) 45 min    Activity Tolerance Patient tolerated treatment well    Behavior During Therapy WFL for tasks assessed/performed              Past Medical History:  Diagnosis Date   Anemia    no current med.   Arthritis    knees   Diabetes mellitus    GERD (gastroesophageal reflux disease)    no current med.   Gout    Hypercholesteremia    Hypertension    has been on med. x 10 yrs.   Hypothyroid    IBS (irritable bowel syndrome)    Post-nasal drip    current cough   Sickle cell trait (HCC)    Urinary urgency    Past Surgical History:  Procedure Laterality Date   ABDOMINAL HYSTERECTOMY  1995   BUNIONECTOMY  08/25/2011   Procedure: Arbutus Leas;  Surgeon: Velna Ochs, MD;  Location: [facility];  Service: Orthopedics;  Laterality: Right;   BUNIONECTOMY Left 08/04/2022   Procedure: LEFT FOOT BUNIONECTOMY;  Surgeon: Marcene Corning, MD;  Location: [facility];  Service: Orthopedics;  Laterality: Left;   CATARACT EXTRACTION  2016   both eyes   COLONOSCOPY W/ BIOPSIES AND POLYPECTOMY     DILATION AND CURETTAGE OF UTERUS     INJECTION KNEE  08/25/2011   Procedure: KNEE INJECTION;  Surgeon: Velna Ochs, MD;  Location: [facility];  Service: Orthopedics;  Laterality: Bilateral;   KNEE ARTHROSCOPY  12/11/2008   bilat., with chondroplasty   KNEE JOINT MANIPULATION  10/30/2009   bilat.   OPEN REDUCTION INTERNAL FIXATION (ORIF) DISTAL RADIAL FRACTURE Left 11/17/2014   Procedure: OPEN  REDUCTION INTERNAL FIXATION (ORIF) LEFT DISTAL RADIUS  FRACTURE;  Surgeon: Dairl Ponder, MD;  Location: [facility];  Service: Orthopedics;  Laterality: Left;   SHOULDER ARTHROSCOPY WITH ROTATOR CUFF REPAIR AND SUBACROMIAL DECOMPRESSION Right 12/15/2022   Procedure: SHOULDER ARTHROSCOPY WITH ROTATOR CUFF REPAIR AND SUBACROMIAL DECOMPRESSION;  Surgeon: Jones Broom, MD;  Location: [facility];  Service: Orthopedics;  Laterality: Right;   TOTAL KNEE ARTHROPLASTY Bilateral 09/26/2009   bilat.   Patient Active Problem List   Diagnosis Date Noted   Posterior vitreous detachment, both eyes 03/26/2022   Diabetes mellitus without complication (HCC) 03/26/2022   Pseudophakia, both eyes 03/26/2022   History of total knee replacement, left 04/03/2019   History of total knee arthroplasty, right 04/03/2019   DERMATOFIBROMA 10/26/2007   ANEMIA, CHRONIC 10/26/2007   SICKLE CELL TRAIT 10/26/2007   ESSENTIAL HYPERTENSION 10/26/2007   GASTROESOPHAGEAL REFLUX DISEASE 10/26/2007   GASTRITIS 10/26/2007   HIATAL HERNIA 10/26/2007   CONSTIPATION, CHRONIC 10/26/2007   GUAIAC POSITIVE STOOL 10/26/2007    THERAPY DIAG:  Chronic right shoulder pain  Muscle weakness   Rationale for Evaluation and Treatment: Rehabilitation  REFERRING DIAG: R R/C repair 3/26   PERTINENT HISTORY: Anemia, sickle cell trait, DM TII, bil knee replacements  PRECAUTIONS/RESTRICTIONS:   R R/C repair 3/26 supraspinatus with biceps release   OP Date: 12/15/2022  2 weeks 12/29/2022  4 weeks 01/12/2023  6 weeks 01/26/2023  8 weeks 02/09/2023  10 weeks 02/23/2023  12 weeks 03/09/2023     SUBJECTIVE: Reports 8/10 pain in shoulder.  Has elevated since last session for no reason.  Has forgotten to bring sling into session today.     Pain:  Are you having pain? Yes Pain location: R shoulder NPRS scale:  5/10 to 10/10 Aggravating factors: movement Relieving factors: rest, ice Pain description: aching Stage:  Acute Stability: getting better 24 hour pattern: worse with movement   OBJECTIVE: (objective measures completed at initial evaluation unless otherwise dated)   GENERAL OBSERVATION:          Not wearing sling "I forgot it in the car"                                SENSATION:          Light touch: Deficits R C6           PALPATION: Expected TTP R shoulder   UPPER EXTREMITY AROM:   ROM Right 01/06/2023 Left 01/06/2023  Shoulder flexion 150    Shoulder abduction      Shoulder internal rotation      Shoulder external rotation      Functional IR      Functional ER      Shoulder extension      Elbow extension      Elbow flexion        (Blank rows = not tested, N = WNL, * = concordant pain with testing)   UPPER EXTREMITY MMT:   MMT Right 01/06/2023 Left 01/06/2023  Shoulder flexion      Shoulder abduction (C5)      Shoulder ER      Shoulder IR      Middle trapezius      Lower trapezius      Shoulder extension      Grip strength      Cervical flexion (C1,C2)      Cervical S/B (C3)      Shoulder shrug (C4)      Elbow flexion (C6)      Elbow ext (C7)      Thumb ext (C8)      Finger abd (T1)      Grossly        (Blank rows = not tested, score listed is out of 5 possible points.  N = WNL, D = diminished, C = clear for gross weakness with myotome testing, * = concordant pain with testing)     UPPER EXTREMITY PROM:   PROM Right 01/06/2023 Left 01/06/2023 R 01/23/23 R  02/02/23  Shoulder flexion 90+   120 135d  Shoulder abduction      80d P!  Shoulder internal rotation      70d  Shoulder external rotation 20+   30+ 40d P!  Functional IR        Functional ER        Shoulder extension        Elbow extension        Elbow flexion          (Blank rows = not tested, N = WNL, * = concordant pain with testing)   PATIENT SURVEYS:  FOTO 24 -> 55  TODAY'S TREATMENT:  Creating, reviewing, and completing below HEP     PATIENT EDUCATION:  POC, diagnosis,  prognosis, HEP, and outcome measures.  Pt educated via explanation, demonstration, and handout (HEP).  Pt confirms understanding verbally.    HOME EXERCISE PROGRAM: Access Code: [email or web address] URL: [email or web address] Date: 01/05/2023 Prepared by: Alphonzo Severance   Exercises - Seated Shoulder Flexion Towel Slide at Table Top  - 1 x daily - 7 x weekly - 2 sets - 10 reps (not past 90 degrees) - Ice  - 5 x daily - 7 x weekly - 1 sets - 1 reps - 20 minutes hold   Treatment priorities     Eval (01/06/2023)              Gentle PROM working to 120 d of flexion and 30 deg of ext rotation              precautions                                                               [facility] Adult PT Treatment:                                                DATE: 02/02/23 Therapeutic Exercise: 4 way scapula against light manual resistance Manual Therapy: STM to R pec minor and R levator/upper trap PROM   TREATMENT 5/4:  Therapeutic Exercise: - seated scapular retraction   Manual Therapy: - PROM with gentle oscillations at end range for ER and scaption   ASSESSMENT:   CLINICAL IMPRESSION: Arrives to PT with increased pain from onset of muscle tightness/spasm.  Despite elevated pain levels ROM increased.  Added scapular stability exercises to mobilize soft tissues of posterior shoulder.  Issued R upper trap stretch for additional HEP activities.   OBJECTIVE IMPAIRMENTS: Pain, R shoulder ROM, R shoulder strength   ACTIVITY LIMITATIONS: reaching, lifting, driving   PERSONAL FACTORS: See medical history and pertinent history     REHAB POTENTIAL: Good   CLINICAL DECISION MAKING: Stable/uncomplicated   EVALUATION COMPLEXITY: Low     GOALS:     SHORT TERM GOALS: Target date: 02/03/2023   Leiliana will be >75% HEP compliant to improve carryover between sessions and facilitate independent management of condition   Evaluation (01/06/2023): ongoing Goal status: Ongoing due to  limited post-op protocol     LONG TERM GOALS: Target date: 03/03/2023   Nianna will improve FOTO score to 55 as a proxy for functional improvement   Evaluation/Baseline (01/06/2023): 24 Goal status: INITIAL     2.  Katrice will demonstrate >130 degrees of active ROM in flexion to allow completion of activities involving reaching OH, not limited by pain   Evaluation/Baseline (01/06/2023): 90 degrees PROM Goal status: INITIAL     3.  Chaliyah will achieve >=60 degrees of shoulder ER at 90 degrees of abduction to allow proper shoulder mechanics during OH movement, not limited by pain    Evaluation/Baseline (01/06/2023): 20 degrees PROM Goal status: INITIAL     4.  Kourtney will be able to reach repeatedly into cabinet with >/= 3#, not limited by pain   Evaluation/Baseline (  01/06/2023): limited Goal status: INITIAL       PLAN: PT FREQUENCY: 1-2x/week   PT DURATION: 8 weeks (Ending 03/03/2023)   PLANNED INTERVENTIONS: Therapeutic exercises, Aquatic therapy, Therapeutic activity, Neuro Muscular re-education, Gait training, Patient/Family education, Joint mobilization, Dry Needling, Electrical stimulation, Spinal mobilization and/or manipulation, Moist heat, Taping, Vasopneumatic device, Ionotophoresis 4mg /ml Dexamethasone, and Manual therapy   Hildred Laser PT 02/02/2023, 2:37 PM

## 2023-02-02 ENCOUNTER — Ambulatory Visit: Payer: Medicare Other

## 2023-02-02 DIAGNOSIS — M25511 Pain in right shoulder: Secondary | ICD-10-CM | POA: Diagnosis not present

## 2023-02-02 DIAGNOSIS — M6281 Muscle weakness (generalized): Secondary | ICD-10-CM | POA: Diagnosis not present

## 2023-02-02 DIAGNOSIS — G8929 Other chronic pain: Secondary | ICD-10-CM | POA: Diagnosis not present

## 2023-02-02 DIAGNOSIS — M25571 Pain in right ankle and joints of right foot: Secondary | ICD-10-CM | POA: Diagnosis not present

## 2023-02-09 ENCOUNTER — Ambulatory Visit: Payer: Medicare Other | Admitting: Physical Therapy

## 2023-02-09 DIAGNOSIS — M6281 Muscle weakness (generalized): Secondary | ICD-10-CM

## 2023-02-09 DIAGNOSIS — M25571 Pain in right ankle and joints of right foot: Secondary | ICD-10-CM | POA: Diagnosis not present

## 2023-02-09 DIAGNOSIS — G8929 Other chronic pain: Secondary | ICD-10-CM | POA: Diagnosis not present

## 2023-02-09 DIAGNOSIS — M25511 Pain in right shoulder: Secondary | ICD-10-CM | POA: Diagnosis not present

## 2023-02-09 NOTE — Therapy (Signed)
OUTPATIENT PHYSICAL THERAPY TREATMENT NOTE   Patient Name: Taylor Edwards MRN: [id number] DOB:[birth year], 82 y.o., female Today's Date: 02/09/2023  PCP: Emilio Aspen, MD   REFERRING PROVIDER: Jones Broom, MD   PT End of Session - 02/09/23 1700     Visit Number 4    Date for PT Re-Evaluation 03/03/23    Authorization Type [employer] MCR - FOTO    Progress Note Due on Visit 10    PT Start Time 1700    PT Stop Time 1730   pt requests to leave early   PT Time Calculation (min) 30 min    Activity Tolerance Patient tolerated treatment well    Behavior During Therapy [facility] for tasks assessed/performed              Past Medical History:  Diagnosis Date   Anemia    no current med.   Arthritis    knees   Diabetes mellitus    GERD (gastroesophageal reflux disease)    no current med.   Gout    Hypercholesteremia    Hypertension    has been on med. x 10 yrs.   Hypothyroid    IBS (irritable bowel syndrome)    Post-nasal drip    current cough   Sickle cell trait (HCC)    Urinary urgency    Past Surgical History:  Procedure Laterality Date   ABDOMINAL HYSTERECTOMY  1995   BUNIONECTOMY  08/25/2011   Procedure: Arbutus Leas;  Surgeon: Velna Ochs, MD;  Location: [facility];  Service: Orthopedics;  Laterality: Right;   BUNIONECTOMY Left 08/04/2022   Procedure: LEFT FOOT BUNIONECTOMY;  Surgeon: Marcene Corning, MD;  Location: [facility];  Service: Orthopedics;  Laterality: Left;   CATARACT EXTRACTION  2016   both eyes   COLONOSCOPY W/ BIOPSIES AND POLYPECTOMY     DILATION AND CURETTAGE OF UTERUS     INJECTION KNEE  08/25/2011   Procedure: KNEE INJECTION;  Surgeon: Velna Ochs, MD;  Location: [facility];  Service: Orthopedics;  Laterality: Bilateral;   KNEE ARTHROSCOPY  12/11/2008   bilat., with chondroplasty   KNEE JOINT MANIPULATION  10/30/2009   bilat.   OPEN REDUCTION INTERNAL FIXATION (ORIF) DISTAL RADIAL FRACTURE Left  11/17/2014   Procedure: OPEN REDUCTION INTERNAL FIXATION (ORIF) LEFT DISTAL RADIUS  FRACTURE;  Surgeon: Dairl Ponder, MD;  Location: [facility];  Service: Orthopedics;  Laterality: Left;   SHOULDER ARTHROSCOPY WITH ROTATOR CUFF REPAIR AND SUBACROMIAL DECOMPRESSION Right 12/15/2022   Procedure: SHOULDER ARTHROSCOPY WITH ROTATOR CUFF REPAIR AND SUBACROMIAL DECOMPRESSION;  Surgeon: Jones Broom, MD;  Location: [facility];  Service: Orthopedics;  Laterality: Right;   TOTAL KNEE ARTHROPLASTY Bilateral 09/26/2009   bilat.   Patient Active Problem List   Diagnosis Date Noted   Posterior vitreous detachment, both eyes 03/26/2022   Diabetes mellitus without complication (HCC) 03/26/2022   Pseudophakia, both eyes 03/26/2022   History of total knee replacement, left 04/03/2019   History of total knee arthroplasty, right 04/03/2019   DERMATOFIBROMA 10/26/2007   ANEMIA, CHRONIC 10/26/2007   SICKLE CELL TRAIT 10/26/2007   ESSENTIAL HYPERTENSION 10/26/2007   GASTROESOPHAGEAL REFLUX DISEASE 10/26/2007   GASTRITIS 10/26/2007   HIATAL HERNIA 10/26/2007   CONSTIPATION, CHRONIC 10/26/2007   GUAIAC POSITIVE STOOL 10/26/2007    THERAPY DIAG:  Chronic right shoulder pain  Muscle weakness  Muscle weakness (generalized)   Rationale for Evaluation and Treatment: Rehabilitation  REFERRING DIAG: R R/C repair 3/26   PERTINENT  HISTORY: Anemia, sickle cell trait, DM TII, bil knee replacements   PRECAUTIONS/RESTRICTIONS:   R R/C repair 3/26 supraspinatus with biceps release   OP Date: 12/15/2022  2 weeks 12/29/2022  4 weeks 01/12/2023  6 weeks 01/26/2023  8 weeks 02/09/2023  10 weeks 02/23/2023  12 weeks 03/09/2023     SUBJECTIVE: Pt reports that she has been having significant R shoulder pain with no clear injury she reports the pain level is 8/10.    Pain:  Are you having pain? Yes Pain location: R shoulder NPRS scale:  5/10 to 10/10 Aggravating factors: movement Relieving  factors: rest, ice Pain description: aching Stage: Acute Stability: getting better 24 hour pattern: worse with movement   OBJECTIVE: (objective measures completed at initial evaluation unless otherwise dated)   GENERAL OBSERVATION:          Not wearing sling "I forgot it in the car"                                SENSATION:          Light touch: Deficits R C6           PALPATION: Expected TTP R shoulder   UPPER EXTREMITY AROM:   ROM Right 01/06/2023 Left 01/06/2023  Shoulder flexion 150    Shoulder abduction      Shoulder internal rotation      Shoulder external rotation      Functional IR      Functional ER      Shoulder extension      Elbow extension      Elbow flexion        (Blank rows = not tested, N = WNL, * = concordant pain with testing)   UPPER EXTREMITY MMT:   MMT Right 01/06/2023 Left 01/06/2023  Shoulder flexion      Shoulder abduction (C5)      Shoulder ER      Shoulder IR      Middle trapezius      Lower trapezius      Shoulder extension      Grip strength      Cervical flexion (C1,C2)      Cervical S/B (C3)      Shoulder shrug (C4)      Elbow flexion (C6)      Elbow ext (C7)      Thumb ext (C8)      Finger abd (T1)      Grossly        (Blank rows = not tested, score listed is out of 5 possible points.  N = WNL, D = diminished, C = clear for gross weakness with myotome testing, * = concordant pain with testing)     UPPER EXTREMITY PROM:   PROM Right 01/06/2023 Left 01/06/2023 R 01/23/23 R  02/02/23  Shoulder flexion 90+   120 135d  Shoulder abduction      80d P!  Shoulder internal rotation      70d  Shoulder external rotation 20+   30+ 40d P!  Functional IR        Functional ER        Shoulder extension        Elbow extension        Elbow flexion          (Blank rows = not tested, N = WNL, * = concordant pain with testing)   PATIENT  SURVEYS:  FOTO 24 -> 55              TODAY'S TREATMENT:  Creating, reviewing, and completing below  HEP     PATIENT EDUCATION:  POC, diagnosis, prognosis, HEP, and outcome measures.  Pt educated via explanation, demonstration, and handout (HEP).  Pt confirms understanding verbally.    HOME EXERCISE PROGRAM: Access Code: [email or web address] URL: [email or web address] Date: 02/09/2023 Prepared by: Alphonzo Severance  Exercises - Ice  - 5 x daily - 7 x weekly - 1 sets - 1 reps - 20 minutes hold - Seated Upper Trapezius Stretch  - 2 x daily - 7 x weekly - 1 sets - 2 reps - 30s hold - Supine Shoulder Flexion AAROM with Dowel  - 1 x daily - 7 x weekly - 3 sets - 10 reps - Supine Shoulder Press AAROM in Abduction with Dowel  - 1 x daily - 7 x weekly - 3 sets - 10 reps   Treatment priorities     Eval (01/06/2023)              Gentle PROM working to 120 d of flexion and 30 deg of ext rotation              precautions                                                               [facility] Adult PT Treatment:                                                DATE: 02/09/23 Therapeutic Exercise: 4 way scapula against light manual resistance Supine chest press with dowel - 3x10 Supine non-painful arc flexion with dowel YTB row - 3x10 YTB shoulder - 3x10  Manual Therapy: PROM  Modalities:  Vasopneumatic (Game Ready)    Location:  right shoulder Time:  5 minutes Pressure:  low Temperature:  38 degrees   TREATMENT 5/4:  Therapeutic Exercise: - seated scapular retraction   Manual Therapy: - PROM with gentle oscillations at end range for ER and scaption   ASSESSMENT:   CLINICAL IMPRESSION:  Bonita Quin tolerated session well with no adverse reaction.  Pt with complaints of continued high pain levels at start of session.  She has been only somewhat compliant with sling and precautions.  On questioning she reports that her pain has not suddenly worsened, but has been this severe since surgery.  Her pain is lower post op than before surgery.  Progressed to AAROM work today to good  effect, with pt reporting 3/10 pain following therapy.  OBJECTIVE IMPAIRMENTS: Pain, R shoulder ROM, R shoulder strength   ACTIVITY LIMITATIONS: reaching, lifting, driving   PERSONAL FACTORS: See medical history and pertinent history     REHAB POTENTIAL: Good   CLINICAL DECISION MAKING: Stable/uncomplicated   EVALUATION COMPLEXITY: Low     GOALS:     SHORT TERM GOALS: Target date: 02/03/2023   Tobie will be >75% HEP compliant to improve carryover between sessions and facilitate independent management of condition   Evaluation (01/06/2023): ongoing Goal status: Ongoing due to limited post-op protocol     LONG  TERM GOALS: Target date: 03/03/2023   Leydi will improve FOTO score to 55 as a proxy for functional improvement   Evaluation/Baseline (01/06/2023): 24 Goal status: INITIAL     2.  Ashana will demonstrate >130 degrees of active ROM in flexion to allow completion of activities involving reaching OH, not limited by pain   Evaluation/Baseline (01/06/2023): 90 degrees PROM Goal status: INITIAL     3.  Senie will achieve >=60 degrees of shoulder ER at 90 degrees of abduction to allow proper shoulder mechanics during OH movement, not limited by pain    Evaluation/Baseline (01/06/2023): 20 degrees PROM Goal status: INITIAL     4.  Nolyn will be able to reach repeatedly into cabinet with >/= 3#, not limited by pain   Evaluation/Baseline (01/06/2023): limited Goal status: INITIAL       PLAN: PT FREQUENCY: 1-2x/week   PT DURATION: 8 weeks (Ending 03/03/2023)   PLANNED INTERVENTIONS: Therapeutic exercises, Aquatic therapy, Therapeutic activity, Neuro Muscular re-education, Gait training, Patient/Family education, Joint mobilization, Dry Needling, Electrical stimulation, Spinal mobilization and/or manipulation, Moist heat, Taping, Vasopneumatic device, Ionotophoresis 4mg /ml Dexamethasone, and Manual therapy   Kimberlee Nearing Talayla Doyel PT 02/09/2023, 5:33 PM

## 2023-02-22 ENCOUNTER — Ambulatory Visit: Payer: Medicare Other | Attending: Internal Medicine

## 2023-02-22 DIAGNOSIS — G8929 Other chronic pain: Secondary | ICD-10-CM | POA: Insufficient documentation

## 2023-02-22 DIAGNOSIS — R600 Localized edema: Secondary | ICD-10-CM | POA: Diagnosis not present

## 2023-02-22 DIAGNOSIS — M6281 Muscle weakness (generalized): Secondary | ICD-10-CM | POA: Diagnosis not present

## 2023-02-22 DIAGNOSIS — M25511 Pain in right shoulder: Secondary | ICD-10-CM | POA: Insufficient documentation

## 2023-02-22 NOTE — Therapy (Signed)
OUTPATIENT PHYSICAL THERAPY TREATMENT NOTE   Patient Name: Taylor Edwards MRN: [id number] DOB:1941/07/30, 82 y.o., female Today's Date: 02/22/2023  PCP: Emilio Aspen, MD   REFERRING PROVIDER: Jones Broom, MD      Past Medical History:  Diagnosis Date   Anemia    no current med.   Arthritis    knees   Diabetes mellitus    GERD (gastroesophageal reflux disease)    no current med.   Gout    Hypercholesteremia    Hypertension    has been on med. x 10 yrs.   Hypothyroid    IBS (irritable bowel syndrome)    Post-nasal drip    current cough   Sickle cell trait (HCC)    Urinary urgency    Past Surgical History:  Procedure Laterality Date   ABDOMINAL HYSTERECTOMY  1995   BUNIONECTOMY  08/25/2011   Procedure: Arbutus Leas;  Surgeon: Velna Ochs, MD;  Location: [facility];  Service: Orthopedics;  Laterality: Right;   BUNIONECTOMY Left 08/04/2022   Procedure: LEFT FOOT BUNIONECTOMY;  Surgeon: Marcene Corning, MD;  Location: [facility];  Service: Orthopedics;  Laterality: Left;   CATARACT EXTRACTION  2016   both eyes   COLONOSCOPY W/ BIOPSIES AND POLYPECTOMY     DILATION AND CURETTAGE OF UTERUS     INJECTION KNEE  08/25/2011   Procedure: KNEE INJECTION;  Surgeon: Velna Ochs, MD;  Location: [facility];  Service: Orthopedics;  Laterality: Bilateral;   KNEE ARTHROSCOPY  12/11/2008   bilat., with chondroplasty   KNEE JOINT MANIPULATION  10/30/2009   bilat.   OPEN REDUCTION INTERNAL FIXATION (ORIF) DISTAL RADIAL FRACTURE Left 11/17/2014   Procedure: OPEN REDUCTION INTERNAL FIXATION (ORIF) LEFT DISTAL RADIUS  FRACTURE;  Surgeon: Dairl Ponder, MD;  Location: [facility];  Service: Orthopedics;  Laterality: Left;   SHOULDER ARTHROSCOPY WITH ROTATOR CUFF REPAIR AND SUBACROMIAL DECOMPRESSION Right 12/15/2022   Procedure: SHOULDER ARTHROSCOPY WITH ROTATOR CUFF REPAIR AND SUBACROMIAL DECOMPRESSION;  Surgeon: Jones Broom, MD;   Location: [facility];  Service: Orthopedics;  Laterality: Right;   TOTAL KNEE ARTHROPLASTY Bilateral 09/26/2009   bilat.   Patient Active Problem List   Diagnosis Date Noted   Posterior vitreous detachment, both eyes 03/26/2022   Diabetes mellitus without complication (HCC) 03/26/2022   Pseudophakia, both eyes 03/26/2022   History of total knee replacement, left 04/03/2019   History of total knee arthroplasty, right 04/03/2019   DERMATOFIBROMA 10/26/2007   ANEMIA, CHRONIC 10/26/2007   SICKLE CELL TRAIT 10/26/2007   ESSENTIAL HYPERTENSION 10/26/2007   GASTROESOPHAGEAL REFLUX DISEASE 10/26/2007   GASTRITIS 10/26/2007   HIATAL HERNIA 10/26/2007   CONSTIPATION, CHRONIC 10/26/2007   GUAIAC POSITIVE STOOL 10/26/2007    THERAPY DIAG:  No diagnosis found.   Rationale for Evaluation and Treatment: Rehabilitation  REFERRING DIAG: R R/C repair 3/26   PERTINENT HISTORY: Anemia, sickle cell trait, DM TII, bil knee replacements   PRECAUTIONS/RESTRICTIONS:   R R/C repair 3/26 supraspinatus with biceps release   OP Date: 12/15/2022  2 weeks 12/29/2022  4 weeks 01/12/2023  6 weeks 01/26/2023  8 weeks 02/09/2023  10 weeks 02/23/2023  12 weeks 03/09/2023     SUBJECTIVE: Reports R shoulder pain rated at 3/10, feels it becomes worse at night as she sleeps on her right side.   Pain:  Are you having pain? Yes Pain location: R shoulder NPRS scale:  5/10 to 10/10 Aggravating factors: movement Relieving factors: rest, ice Pain description: aching  Stage: Acute Stability: getting better 24 hour pattern: worse with movement   OBJECTIVE: (objective measures completed at initial evaluation unless otherwise dated)   GENERAL OBSERVATION:          Not wearing sling "I forgot it in the car"                                SENSATION:          Light touch: Deficits R C6           PALPATION: Expected TTP R shoulder   UPPER EXTREMITY AROM:   ROM Right 01/06/2023 Left 01/06/2023   Shoulder flexion 150    Shoulder abduction      Shoulder internal rotation      Shoulder external rotation      Functional IR      Functional ER      Shoulder extension      Elbow extension      Elbow flexion        (Blank rows = not tested, N = WNL, * = concordant pain with testing)   UPPER EXTREMITY MMT:   MMT Right 01/06/2023 Left 01/06/2023  Shoulder flexion      Shoulder abduction (C5)      Shoulder ER      Shoulder IR      Middle trapezius      Lower trapezius      Shoulder extension      Grip strength      Cervical flexion (C1,C2)      Cervical S/B (C3)      Shoulder shrug (C4)      Elbow flexion (C6)      Elbow ext (C7)      Thumb ext (C8)      Finger abd (T1)      Grossly        (Blank rows = not tested, score listed is out of 5 possible points.  N = WNL, D = diminished, C = clear for gross weakness with myotome testing, * = concordant pain with testing)     UPPER EXTREMITY PROM:   PROM Right 01/06/2023 Left 01/06/2023 R 01/23/23 R  02/02/23  Shoulder flexion 90+   120 135d  Shoulder abduction      80d P!  Shoulder internal rotation      70d  Shoulder external rotation 20+   30+ 40d P!  Functional IR        Functional ER        Shoulder extension        Elbow extension        Elbow flexion          (Blank rows = not tested, N = WNL, * = concordant pain with testing)   PATIENT SURVEYS:  FOTO 24 -> 55              TODAY'S TREATMENT:  Creating, reviewing, and completing below HEP     PATIENT EDUCATION:  POC, diagnosis, prognosis, HEP, and outcome measures.  Pt educated via explanation, demonstration, and handout (HEP).  Pt confirms understanding verbally.    HOME EXERCISE PROGRAM: Access Code: [email or web address] URL: [email or web address] Date: 02/09/2023 Prepared by: Alphonzo Severance  Exercises - Ice  - 5 x daily - 7 x weekly - 1 sets - 1 reps - 20 minutes hold - Seated Upper Trapezius Stretch  - 2 x daily - 7 x  weekly - 1 sets - 2 reps  - 30s hold - Supine Shoulder Flexion AAROM with Dowel  - 1 x daily - 7 x weekly - 3 sets - 10 reps - Supine Shoulder Press AAROM in Abduction with Dowel  - 1 x daily - 7 x weekly - 3 sets - 10 reps   Treatment priorities     Eval (01/06/2023)              Gentle PROM working to 120 d of flexion and 30 deg of ext rotation              precautions                                                             [facility] Adult PT Treatment:                                                DATE: 02/22/23 Therapeutic Exercise: 4 way scapula against light manual resistance 15x ea Supine chest press with dowel - 3x10 Supine non-painful arc flexion with dowel 30x Rhythmic stabilization at 90d flexion 30s x3 YTB row - 3x10 YTB shoulder ER- 3x10 Manual Therapy: PROM 45d ER, IR WFL, 145d supine flexion, 90d abd PNF D1 F/E 15x  Cryotherapy: 10 min to R shoulder   [facility] Adult PT Treatment:                                                DATE: 02/09/23 Therapeutic Exercise: 4 way scapula against light manual resistance Supine chest press with dowel - 3x10 Supine non-painful arc flexion with dowel YTB row - 3x10 YTB shoulder - 3x10  Manual Therapy: PROM  Modalities:  Vasopneumatic (Game Ready)    Location:  right shoulder Time:  5 minutes Pressure:  low Temperature:  38 degrees   TREATMENT 5/4:  Therapeutic Exercise: - seated scapular retraction   Manual Therapy: - PROM with gentle oscillations at end range for ER and scaption   ASSESSMENT:   CLINICAL IMPRESSION: Patient is 10 weeks post-op with continued pain and guarding through R shoulder and poor tolerance to PROM and manual techniques.  Introduced manual PNF patterns to promote strength ROM, rhythmic stabilization for RC control.  Marked guarding contributing to subacromial symptoms.  Patient has only attended 5 PT sessions since surgery.  OBJECTIVE IMPAIRMENTS: Pain, R shoulder ROM, R shoulder strength   ACTIVITY LIMITATIONS:  reaching, lifting, driving   PERSONAL FACTORS: See medical history and pertinent history     REHAB POTENTIAL: Good   CLINICAL DECISION MAKING: Stable/uncomplicated   EVALUATION COMPLEXITY: Low     GOALS:     SHORT TERM GOALS: Target date: 02/03/2023   Taylor Edwards will be >75% HEP compliant to improve carryover between sessions and facilitate independent management of condition   Evaluation (01/06/2023): ongoing Goal status: Ongoing due to limited post-op protocol     LONG TERM GOALS: Target date: 03/03/2023   Taylor Edwards will improve FOTO score to 55 as a proxy for functional  improvement   Evaluation/Baseline (01/06/2023): 24 Goal status: INITIAL     2.  Taylor Edwards will demonstrate >130 degrees of active ROM in flexion to allow completion of activities involving reaching OH, not limited by pain   Evaluation/Baseline (01/06/2023): 90 degrees PROM Goal status: INITIAL     3.  Taylor Edwards will achieve >=60 degrees of shoulder ER at 90 degrees of abduction to allow proper shoulder mechanics during OH movement, not limited by pain    Evaluation/Baseline (01/06/2023): 20 degrees PROM Goal status: INITIAL     4.  Taylor Edwards will be able to reach repeatedly into cabinet with >/= 3#, not limited by pain   Evaluation/Baseline (01/06/2023): limited Goal status: INITIAL       PLAN: PT FREQUENCY: 1-2x/week   PT DURATION: 8 weeks (Ending 03/03/2023)   PLANNED INTERVENTIONS: Therapeutic exercises, Aquatic therapy, Therapeutic activity, Neuro Muscular re-education, Gait training, Patient/Family education, Joint mobilization, Dry Needling, Electrical stimulation, Spinal mobilization and/or manipulation, Moist heat, Taping, Vasopneumatic device, Ionotophoresis 4mg /ml Dexamethasone, and Manual therapy   Hildred Laser PT 02/22/2023, 4:05 PM

## 2023-02-24 ENCOUNTER — Ambulatory Visit: Payer: Medicare Other

## 2023-03-05 ENCOUNTER — Ambulatory Visit: Payer: Medicare Other | Admitting: Physical Therapy

## 2023-03-05 ENCOUNTER — Encounter: Payer: Self-pay | Admitting: Physical Therapy

## 2023-03-05 DIAGNOSIS — M6281 Muscle weakness (generalized): Secondary | ICD-10-CM | POA: Diagnosis not present

## 2023-03-05 DIAGNOSIS — R600 Localized edema: Secondary | ICD-10-CM | POA: Diagnosis not present

## 2023-03-05 DIAGNOSIS — M25511 Pain in right shoulder: Secondary | ICD-10-CM | POA: Diagnosis not present

## 2023-03-05 DIAGNOSIS — G8929 Other chronic pain: Secondary | ICD-10-CM

## 2023-03-05 NOTE — Therapy (Signed)
Progress Note Reporting Period 4/16 to 6/14  See note below for Objective Data and Assessment of Progress/Goals.      Patient Name: Taylor Edwards MRN: [id number] DOB:Feb 08, 1941, 82 y.o., female Today's Date: 03/05/2023  PCP: Emilio Aspen, MD   REFERRING PROVIDER: Jones Broom, MD   PT End of Session - 03/05/23 1128     Visit Number 6    Date for PT Re-Evaluation 04/30/23    Authorization Type [employer] MCR - FOTO    Progress Note Due on Visit 10    PT Start Time 1130    PT Stop Time 1200   pt requests to leave early for work   PT Time Calculation (min) 30 min    Activity Tolerance Patient tolerated treatment well    Behavior During Therapy WFL for tasks assessed/performed               Past Medical History:  Diagnosis Date   Anemia    no current med.   Arthritis    knees   Diabetes mellitus    GERD (gastroesophageal reflux disease)    no current med.   Gout    Hypercholesteremia    Hypertension    has been on med. x 10 yrs.   Hypothyroid    IBS (irritable bowel syndrome)    Post-nasal drip    current cough   Sickle cell trait (HCC)    Urinary urgency    Past Surgical History:  Procedure Laterality Date   ABDOMINAL HYSTERECTOMY  1995   BUNIONECTOMY  08/25/2011   Procedure: Arbutus Leas;  Surgeon: Velna Ochs, MD;  Location: [facility];  Service: Orthopedics;  Laterality: Right;   BUNIONECTOMY Left 08/04/2022   Procedure: LEFT FOOT BUNIONECTOMY;  Surgeon: Marcene Corning, MD;  Location: [facility];  Service: Orthopedics;  Laterality: Left;   CATARACT EXTRACTION  2016   both eyes   COLONOSCOPY W/ BIOPSIES AND POLYPECTOMY     DILATION AND CURETTAGE OF UTERUS     INJECTION KNEE  08/25/2011   Procedure: KNEE INJECTION;  Surgeon: Velna Ochs, MD;  Location: [facility];  Service: Orthopedics;  Laterality: Bilateral;   KNEE ARTHROSCOPY  12/11/2008   bilat., with chondroplasty   KNEE JOINT MANIPULATION   10/30/2009   bilat.   OPEN REDUCTION INTERNAL FIXATION (ORIF) DISTAL RADIAL FRACTURE Left 11/17/2014   Procedure: OPEN REDUCTION INTERNAL FIXATION (ORIF) LEFT DISTAL RADIUS  FRACTURE;  Surgeon: Dairl Ponder, MD;  Location: [facility];  Service: Orthopedics;  Laterality: Left;   SHOULDER ARTHROSCOPY WITH ROTATOR CUFF REPAIR AND SUBACROMIAL DECOMPRESSION Right 12/15/2022   Procedure: SHOULDER ARTHROSCOPY WITH ROTATOR CUFF REPAIR AND SUBACROMIAL DECOMPRESSION;  Surgeon: Jones Broom, MD;  [facility];  Service: Orthopedics;  Laterality: Right;   TOTAL KNEE ARTHROPLASTY Bilateral 09/26/2009   bilat.   Patient Active Problem List   Diagnosis Date Noted   Posterior vitreous detachment, both eyes 03/26/2022   Diabetes mellitus without complication (HCC) 03/26/2022   Pseudophakia, both eyes 03/26/2022   History of total knee replacement, left 04/03/2019   History of total knee arthroplasty, right 04/03/2019   DERMATOFIBROMA 10/26/2007   ANEMIA, CHRONIC 10/26/2007   SICKLE CELL TRAIT 10/26/2007   ESSENTIAL HYPERTENSION 10/26/2007   GASTROESOPHAGEAL REFLUX DISEASE 10/26/2007   GASTRITIS 10/26/2007   HIATAL HERNIA 10/26/2007   CONSTIPATION, CHRONIC 10/26/2007   GUAIAC POSITIVE STOOL 10/26/2007    THERAPY DIAG:  Chronic right shoulder pain - Plan: PT plan of care  cert/re-cert, CANCELED: PT plan of care cert/re-cert  Muscle weakness - Plan: PT plan of care cert/re-cert, CANCELED: PT plan of care cert/re-cert  Muscle weakness (generalized) - Plan: PT plan of care cert/re-cert, CANCELED: PT plan of care cert/re-cert  Localized edema   Rationale for Evaluation and Treatment: Rehabilitation  REFERRING DIAG: R R/C repair 3/26   PERTINENT HISTORY: Anemia, sickle cell trait, DM TII, bil knee replacements   PRECAUTIONS/RESTRICTIONS:   R R/C repair 3/26 supraspinatus with biceps release   OP Date: 12/15/2022  2 weeks 12/29/2022  4 weeks 01/12/2023  6 weeks 01/26/2023   8 weeks 02/09/2023  10 weeks 02/23/2023  12 weeks 03/09/2023     SUBJECTIVE: Reports R shoulder pain rated at 3/10, feels it becomes worse at night as she sleeps on her right side.   Pain:  Are you having pain? Yes Pain location: R shoulder NPRS scale:  5/10 to 10/10 Aggravating factors: movement Relieving factors: rest, ice Pain description: aching Stage: Acute Stability: getting better 24 hour pattern: worse with movement   OBJECTIVE: (objective measures completed at initial evaluation unless otherwise dated)   GENERAL OBSERVATION:          Not wearing sling "I forgot it in the car"                                SENSATION:          Light touch: Deficits R C6           PALPATION: Expected TTP R shoulder   UPPER EXTREMITY AROM:   ROM Right 01/06/2023 Left 01/06/2023  Shoulder flexion 150    Shoulder abduction      Shoulder internal rotation      Shoulder external rotation      Functional IR      Functional ER      Shoulder extension      Elbow extension      Elbow flexion        (Blank rows = not tested, N = WNL, * = concordant pain with testing)   UPPER EXTREMITY MMT:   MMT Right 01/06/2023 Left 01/06/2023  Shoulder flexion      Shoulder abduction (C5)      Shoulder ER      Shoulder IR      Middle trapezius      Lower trapezius      Shoulder extension      Grip strength      Cervical flexion (C1,C2)      Cervical S/B (C3)      Shoulder shrug (C4)      Elbow flexion (C6)      Elbow ext (C7)      Thumb ext (C8)      Finger abd (T1)      Grossly        (Blank rows = not tested, score listed is out of 5 possible points.  N = WNL, D = diminished, C = clear for gross weakness with myotome testing, * = concordant pain with testing)     UPPER EXTREMITY PROM:   PROM Right 01/06/2023 Left 01/06/2023 R 01/23/23 R  02/02/23  Shoulder flexion 90+   120 135d  Shoulder abduction      80d P!  Shoulder internal rotation      70d  Shoulder external rotation 20+    30+ 40d P!  Functional IR  Functional ER        Shoulder extension        Elbow extension        Elbow flexion          (Blank rows = not tested, N = WNL, * = concordant pain with testing)   PATIENT SURVEYS:  FOTO 24 -> 55              TODAY'S TREATMENT:  Creating, reviewing, and completing below HEP     PATIENT EDUCATION:  POC, diagnosis, prognosis, HEP, and outcome measures.  Pt educated via explanation, demonstration, and handout (HEP).  Pt confirms understanding verbally.    HOME EXERCISE PROGRAM: Access Code: [email or web address] URL: [email or web address] Date: 03/05/2023 Prepared by: Alphonzo Severance  Exercises - Ice  - 5 x daily - 7 x weekly - 1 sets - 1 reps - 20 minutes hold - Shoulder Flexion Wall Slide with Towel  - 1 x daily - 7 x weekly - 3 sets - 10 reps - Standing Shoulder Row with Anchored Resistance  - 1 x daily - 7 x weekly - 3 sets - 10 reps - Shoulder extension with resistance - Neutral  - 1 x daily - 7 x weekly - 3 sets - 10 reps - Shoulder External Rotation with Anchored Resistance  - 1 x daily - 7 x weekly - 3 sets - 10 reps   Treatment priorities     Eval (01/06/2023)              Gentle PROM working to 120 d of flexion and 30 deg of ext rotation              precautions                                                             [facility] Adult PT Treatment:                                                DATE: 03/05/23 Therapeutic Exercise: Pulley 4'  UE ranger - x20 flexion - 20x scaption @ 28'' Towel slide on wall - 20x Row - GTB - 3x10 Shoulder ext - YTB - 3x10 Standing ER with YTB - 3x10  Therapeutic Activity - collecting information for goals, checking progress, and reviewing with patient  [facility] Adult PT Treatment:                                                DATE: 02/09/23 Therapeutic Exercise: 4 way scapula against light manual resistance Supine chest press with dowel - 3x10 Supine non-painful arc flexion with dowel YTB  row - 3x10 YTB shoulder - 3x10  Manual Therapy: PROM  Modalities:  Vasopneumatic (Game Ready)    Location:  right shoulder Time:  5 minutes Pressure:  low Temperature:  38 degrees   TREATMENT 5/4:  Therapeutic Exercise: - seated scapular retraction   Manual Therapy: - PROM with gentle oscillations at end range for ER and scaption   ASSESSMENT:  CLINICAL IMPRESSION:  Kristol has progressed fair with therapy.  Improved impairments include: shoulder ROM, strength, pain.  Functional improvements include: ability to use R shoulder for very light tasks.  Progressions needed include: progressive strengthening and functional ROM.  Barriers to progress include: pt has only attended 5 pt treatment sessions since surgery and has had poor compliance with precautions and HEP; discussed need for HEP compliance and regular PT to progress today; pt confirms understanding.  Please see GOALS section for progress on short term and long term goals established at evaluation.  I recommend continuation of PT to allow completion of remaining goals and continued functional progression.  OBJECTIVE IMPAIRMENTS: Pain, R shoulder ROM, R shoulder strength   ACTIVITY LIMITATIONS: reaching, lifting, driving   PERSONAL FACTORS: See medical history and pertinent history     REHAB POTENTIAL: Good   CLINICAL DECISION MAKING: Stable/uncomplicated   EVALUATION COMPLEXITY: Low     GOALS:     SHORT TERM GOALS: Target date: 02/03/2023   Aundraya will be >75% HEP compliant to improve carryover between sessions and facilitate independent management of condition   Evaluation (01/06/2023): ongoing Goal status: Ongoing due to limited post-op protocol     LONG TERM GOALS: Target date: 03/03/2023 (extended until 04/30/2023)   Betel will improve FOTO score to 55 as a proxy for functional improvement   Evaluation/Baseline (01/06/2023): 24 6/14: 47 Goal status: Progressing      2.  Ally will demonstrate >130  degrees of active ROM in flexion to allow completion of activities involving reaching OH, not limited by pain   Evaluation/Baseline (01/06/2023): 90 degrees PROM 6/14: 80 AROM Goal status: ongoing     3.  Sabriel will achieve >=60 degrees of shoulder ER at 90 degrees of abduction to allow proper shoulder mechanics during OH movement, not limited by pain    Evaluation/Baseline (01/06/2023): 20 degrees PROM 6/14: 50 Goal status: ongoing     4.  Kirstie will be able to reach repeatedly into cabinet with >/= 3#, not limited by pain   Evaluation/Baseline (01/06/2023): limited 6/14: unable to reach into cabinet Goal status: ongoing       PLAN: PT FREQUENCY: 1-2x/week   PT DURATION: 8 weeks (Ending 03/03/2023)   PLANNED INTERVENTIONS: Therapeutic exercises, Aquatic therapy, Therapeutic activity, Neuro Muscular re-education, Gait training, Patient/Family education, Joint mobilization, Dry Needling, Electrical stimulation, Spinal mobilization and/or manipulation, Moist heat, Taping, Vasopneumatic device, Ionotophoresis 4mg /ml Dexamethasone, and Manual therapy   Kimberlee Nearing April Carlyon PT 03/05/2023, 12:10 PM

## 2023-03-09 NOTE — Therapy (Unsigned)
Progress Note Reporting Period 4/16 to 6/14  See note below for Objective Data and Assessment of Progress/Goals.      Patient Name: Taylor Edwards MRN: [id number] DOB:May 10, 1941, 82 y.o., female Today's Date: 03/11/2023  PCP: Emilio Aspen, MD   REFERRING PROVIDER: Jones Broom, MD   PT End of Session - 03/11/23 1701     Visit Number 7    Date for PT Re-Evaluation 04/30/23    Authorization Type UHC MCR - FOTO    Progress Note Due on Visit 10    PT Start Time 1700    Activity Tolerance Patient tolerated treatment well    Behavior During Therapy [facility] for tasks assessed/performed               Past Medical History:  Diagnosis Date   Anemia    no current med.   Arthritis    knees   Diabetes mellitus    GERD (gastroesophageal reflux disease)    no current med.   Gout    Hypercholesteremia    Hypertension    has been on med. x 10 yrs.   Hypothyroid    IBS (irritable bowel syndrome)    Post-nasal drip    current cough   Sickle cell trait (HCC)    Urinary urgency    Past Surgical History:  Procedure Laterality Date   ABDOMINAL HYSTERECTOMY  1995   BUNIONECTOMY  08/25/2011   Procedure: Arbutus Leas;  Surgeon: Velna Ochs, MD;  Location: [facility];  Service: Orthopedics;  Laterality: Right;   BUNIONECTOMY Left 08/04/2022   Procedure: LEFT FOOT BUNIONECTOMY;  Surgeon: Marcene Corning, MD;  Location: [facility];  Service: Orthopedics;  Laterality: Left;   CATARACT EXTRACTION  2016   both eyes   COLONOSCOPY W/ BIOPSIES AND POLYPECTOMY     DILATION AND CURETTAGE OF UTERUS     INJECTION KNEE  08/25/2011   Procedure: KNEE INJECTION;  Surgeon: Velna Ochs, MD;  Location:  SUR[facility]ERY CENTER;  Service: Orthopedics;  Laterality: Bilateral;   KNEE ARTHROSCOPY  12/11/2008   bilat., with chondroplasty   KNEE JOINT MANIPULATION  10/30/2009   bilat.   OPEN REDUCTION INTERNAL FIXATION (ORIF) DISTAL RADIAL FRACTURE Left 11/17/2014    Procedure: OPEN REDUCTION INTERNAL FIXATION (ORIF) LEFT DISTAL RADIUS  FRACTURE;  Surgeon: Dairl Ponder, MD;  Location: [facility];  Service: Orthopedics;  Laterality: Left;   SHOULDER ARTHROSCOPY WITH ROTATOR CUFF REPAIR AND SUBACROMIAL DECOMPRESSION Right 12/15/2022   Procedure: SHOULDER ARTHROSCOPY WITH ROTATOR CUFF REPAIR AND SUBACROMIAL DECOMPRESSION;  Surgeon: Jones Broom, MD;  Location:  SURGERY CENTER;  Service: Orthopedics;  Laterality: Right;   TOTAL KNEE ARTHROPLASTY Bilateral 09/26/2009   bilat.   Patient Active Problem List   Diagnosis Date Noted   Posterior vitreous detachment, both eyes 03/26/2022   Diabetes mellitus without complication (HCC) 03/26/2022   Pseudophakia, both eyes 03/26/2022   History of total knee replacement, left 04/03/2019   History of total knee arthroplasty, right 04/03/2019   DERMATOFIBROMA 10/26/2007   ANEMIA, CHRONIC 10/26/2007   SICKLE CELL TRAIT 10/26/2007   ESSENTIAL HYPERTENSION 10/26/2007   GASTROESOPHAGEAL REFLUX DISEASE 10/26/2007   GASTRITIS 10/26/2007   HIATAL HERNIA 10/26/2007   CONSTIPATION, CHRONIC 10/26/2007   GUAIAC POSITIVE STOOL 10/26/2007    THERAPY DIAG:  Chronic right shoulder pain  Muscle weakness   Rationale for Evaluation and Treatment: Rehabilitation  REFERRING DIAG: R R/C repair 3/26   PERTINENT HISTORY: Anemia, sickle cell trait, DM TII, bil knee  replacements   PRECAUTIONS/RESTRICTIONS:   R R/C repair 3/26 supraspinatus with biceps release   OP Date: 12/15/2022  2 weeks 12/29/2022  4 weeks 01/12/2023  6 weeks 01/26/2023  8 weeks 02/09/2023  10 weeks 02/23/2023  12 weeks 03/09/2023     SUBJECTIVE: R shoulder pain has subsided bu still present at night during sleep positions.  3/10 baseline symptoms, 8/10 with activity.   Pain:  Are you having pain? Yes Pain location: R shoulder NPRS scale:  5/10 to 10/10 Aggravating factors: movement Relieving factors: rest, ice Pain description:  aching Stage: Acute Stability: getting better 24 hour pattern: worse with movement   OBJECTIVE: (objective measures completed at initial evaluation unless otherwise dated)   GENERAL OBSERVATION:          Not wearing sling "I forgot it in the car"                                SENSATION:          Light touch: Deficits R C6           PALPATION: Expected TTP R shoulder   UPPER EXTREMITY AROM:   ROM Right 01/06/2023 Left 01/06/2023  Shoulder flexion 150    Shoulder abduction      Shoulder internal rotation      Shoulder external rotation      Functional IR      Functional ER      Shoulder extension      Elbow extension      Elbow flexion        (Blank rows = not tested, N = WNL, * = concordant pain with testing)   UPPER EXTREMITY MMT:   MMT Right 01/06/2023 Left 01/06/2023  Shoulder flexion      Shoulder abduction (C5)      Shoulder ER      Shoulder IR      Middle trapezius      Lower trapezius      Shoulder extension      Grip strength      Cervical flexion (C1,C2)      Cervical S/B (C3)      Shoulder shrug (C4)      Elbow flexion (C6)      Elbow ext (C7)      Thumb ext (C8)      Finger abd (T1)      Grossly        (Blank rows = not tested, score listed is out of 5 possible points.  N = WNL, D = diminished, C = clear for gross weakness with myotome testing, * = concordant pain with testing)     UPPER EXTREMITY PROM:   PROM Right 01/06/2023 Left 01/06/2023 R 01/23/23 R  02/02/23  Shoulder flexion 90+   120 135d  Shoulder abduction      80d P!  Shoulder internal rotation      70d  Shoulder external rotation 20+   30+ 40d P!  Functional IR        Functional ER        Shoulder extension        Elbow extension        Elbow flexion          (Blank rows = not tested, N = WNL, * = concordant pain with testing)   PATIENT SURVEYS:  FOTO 24 -> 55  TODAY'S TREATMENT:  Creating, reviewing, and completing below HEP     PATIENT EDUCATION:  POC,  diagnosis, prognosis, HEP, and outcome measures.  Pt educated via explanation, demonstration, and handout (HEP).  Pt confirms understanding verbally.    HOME EXERCISE PROGRAM: Access Code: [email or web address] URL: [email or web address] Date: 03/05/2023 Prepared by: Alphonzo Severance  Exercises - Ice  - 5 x daily - 7 x weekly - 1 sets - 1 reps - 20 minutes hold - Shoulder Flexion Wall Slide with Towel  - 1 x daily - 7 x weekly - 3 sets - 10 reps - Standing Shoulder Row with Anchored Resistance  - 1 x daily - 7 x weekly - 3 sets - 10 reps - Shoulder extension with resistance - Neutral  - 1 x daily - 7 x weekly - 3 sets - 10 reps - Shoulder External Rotation with Anchored Resistance  - 1 x daily - 7 x weekly - 3 sets - 10 reps   Treatment priorities     Eval (01/06/2023)              Gentle PROM working to 120 d of flexion and 30 deg of ext rotation              precautions                                                            [facility] Adult PT Treatment:                                                DATE: 02/2023 Therapeutic Exercise: Pulley 4'  Prone extension 500g ball 15x Prone flexion 500g ball 15x Prone hor abd 500g ball 15x Prone row 500g ball 15x Supine flexion 500g ball 15x Supine press 500g ball 15x Seated scaption 500g ball 15x UE ranger - x15 flexion - 15x scaption - 15x abduction @ 28'' Towel slide on wall - 15x  Manual Therapy: Inferior and posterior glides 5x10 to promote flexion and abduction Supine D1 FE PNF 15x    [facility] Adult PT Treatment:                                                DATE: 03/05/23 Therapeutic Exercise: Pulley 4'  UE ranger - x20 flexion - 20x scaption @ 28'' Towel slide on wall - 20x Row - GTB - 3x10 Shoulder ext - YTB - 3x10 Standing ER with YTB - 3x10  Therapeutic Activity - collecting information for goals, checking progress, and reviewing with patient  [facility] Adult PT Treatment:                                                 DATE: 02/09/23 Therapeutic Exercise: 4 way scapula against light manual resistance Supine chest press with dowel - 3x10 Supine non-painful arc flexion with dowel YTB row - 3x10 YTB shoulder - 3x10  Manual Therapy: PROM  Modalities:  Vasopneumatic (Game Ready)    Location:  right shoulder Time:  5 minutes Pressure:  low Temperature:  38 degrees   TREATMENT 5/4:  Therapeutic Exercise: - seated scapular retraction   Manual Therapy: - PROM with gentle oscillations at end range for ER and scaption   ASSESSMENT:   CLINICAL IMPRESSION: Today's session focused on ROM and manual therapy techniques to facilitate mobility.  Added prone tasks for posterior shoulder strengthening.  Incorporated weighted ball for shoulder and RC strengthening.  Continues with marked weakness in scaption as well as decreased PROM in abduction   Alina has progressed fair with therapy.  Improved impairments include: shoulder ROM, strength, pain.  Functional improvements include: ability to use R shoulder for very light tasks.  Progressions needed include: progressive strengthening and functional ROM.  Barriers to progress include: pt has only attended 5 pt treatment sessions since surgery and has had poor compliance with precautions and HEP; discussed need for HEP compliance and regular PT to progress today; pt confirms understanding.  Please see GOALS section for progress on short term and long term goals established at evaluation.  I recommend continuation of PT to allow completion of remaining goals and continued functional progression.  OBJECTIVE IMPAIRMENTS: Pain, R shoulder ROM, R shoulder strength   ACTIVITY LIMITATIONS: reaching, lifting, driving   PERSONAL FACTORS: See medical history and pertinent history     REHAB POTENTIAL: Good   CLINICAL DECISION MAKING: Stable/uncomplicated   EVALUATION COMPLEXITY: Low     GOALS:     SHORT TERM GOALS: Target date: 02/03/2023   Delon will be >75%  HEP compliant to improve carryover between sessions and facilitate independent management of condition   Evaluation (01/06/2023): ongoing Goal status: Ongoing due to limited post-op protocol     LONG TERM GOALS: Target date: 03/03/2023 (extended until 04/30/2023)   Barara will improve FOTO score to 55 as a proxy for functional improvement   Evaluation/Baseline (01/06/2023): 24 6/14: 47 Goal status: Progressing      2.  Djenaba will demonstrate >130 degrees of active ROM in flexion to allow completion of activities involving reaching OH, not limited by pain   Evaluation/Baseline (01/06/2023): 90 degrees PROM 6/14: 80 AROM Goal status: ongoing     3.  Tandra will achieve >=60 degrees of shoulder ER at 90 degrees of abduction to allow proper shoulder mechanics during OH movement, not limited by pain    Evaluation/Baseline (01/06/2023): 20 degrees PROM 6/14: 50 Goal status: ongoing     4.  Journeii will be able to reach repeatedly into cabinet with >/= 3#, not limited by pain   Evaluation/Baseline (01/06/2023): limited 6/14: unable to reach into cabinet Goal status: ongoing       PLAN: PT FREQUENCY: 1-2x/week   PT DURATION: 8 weeks (Ending 03/03/2023)   PLANNED INTERVENTIONS: Therapeutic exercises, Aquatic therapy, Therapeutic activity, Neuro Muscular re-education, Gait training, Patient/Family education, Joint mobilization, Dry Needling, Electrical stimulation, Spinal mobilization and/or manipulation, Moist heat, Taping, Vasopneumatic device, Ionotophoresis 4mg /ml Dexamethasone, and Manual therapy   Hildred Laser PT 03/11/2023, 5:40 PM

## 2023-03-11 ENCOUNTER — Ambulatory Visit: Payer: Medicare Other

## 2023-03-11 DIAGNOSIS — M6281 Muscle weakness (generalized): Secondary | ICD-10-CM | POA: Diagnosis not present

## 2023-03-11 DIAGNOSIS — G8929 Other chronic pain: Secondary | ICD-10-CM | POA: Diagnosis not present

## 2023-03-11 DIAGNOSIS — R600 Localized edema: Secondary | ICD-10-CM | POA: Diagnosis not present

## 2023-03-11 DIAGNOSIS — M25511 Pain in right shoulder: Secondary | ICD-10-CM | POA: Diagnosis not present

## 2023-03-15 NOTE — Therapy (Signed)
PHYSICAL THERAPY TREATMENT NOTE     Patient Name: Taylor Edwards MRN: [id number] DOB:June 28, 1941, 82 y.o., female Today's Date: 03/16/2023  PCP: Emilio Aspen, MD   REFERRING PROVIDER: Jones Broom, MD   PT End of Session - 03/16/23 1214     Visit Number 8    Date for PT Re-Evaluation 04/30/23    Authorization Type UHC MCR - FOTO    Progress Note Due on Visit 10    PT Start Time 1215    PT Stop Time 1255    PT Time Calculation (min) 40 min    Activity Tolerance Patient tolerated treatment well    Behavior During Therapy WFL for tasks assessed/performed               Past Medical History:  Diagnosis Date   Anemia    no current med.   Arthritis    knees   Diabetes mellitus    GERD (gastroesophageal reflux disease)    no current med.   Gout    Hypercholesteremia    Hypertension    has been on med. x 10 yrs.   Hypothyroid    IBS (irritable bowel syndrome)    Post-nasal drip    current cough   Sickle cell trait (HCC)    Urinary urgency    Past Surgical History:  Procedure Laterality Date   ABDOMINAL HYSTERECTOMY  1995   BUNIONECTOMY  08/25/2011   Procedure: Arbutus Leas;  Surgeon: Velna Ochs, MD;  Location: [facility];  Service: Orthopedics;  Laterality: Right;   BUNIONECTOMY Left 08/04/2022   Procedure: LEFT FOOT BUNIONECTOMY;  Surgeon: Marcene Corning, MD;  Location: [facility];  Service: Orthopedics;  Laterality: Left;   CATARACT EXTRACTION  2016   both eyes   COLONOSCOPY W/ BIOPSIES AND POLYPECTOMY     DILATION AND CURETTAGE OF UTERUS     INJECTION KNEE  08/25/2011   Procedure: KNEE INJECTION;  Surgeon: Velna Ochs, MD;  Location: [facility];  Service: Orthopedics;  Laterality: Bilateral;   KNEE ARTHROSCOPY  12/11/2008   bilat., with chondroplasty   KNEE JOINT MANIPULATION  10/30/2009   bilat.   OPEN REDUCTION INTERNAL FIXATION (ORIF) DISTAL RADIAL FRACTURE Left 11/17/2014   Procedure: OPEN  REDUCTION INTERNAL FIXATION (ORIF) LEFT DISTAL RADIUS  FRACTURE;  Surgeon: Dairl Ponder, MD;  Location: [facility];  Service: Orthopedics;  Laterality: Left;   SHOULDER ARTHROSCOPY WITH ROTATOR CUFF REPAIR AND SUBACROMIAL DECOMPRESSION Right 12/15/2022   Procedure: SHOULDER ARTHROSCOPY WITH ROTATOR CUFF REPAIR AND SUBACROMIAL DECOMPRESSION;  Surgeon: Jones Broom, MD;  Location: [facility];  Service: Orthopedics;  Laterality: Right;   TOTAL KNEE ARTHROPLASTY Bilateral 09/26/2009   bilat.   Patient Active Problem List   Diagnosis Date Noted   Posterior vitreous detachment, both eyes 03/26/2022   Diabetes mellitus without complication (HCC) 03/26/2022   Pseudophakia, both eyes 03/26/2022   History of total knee replacement, left 04/03/2019   History of total knee arthroplasty, right 04/03/2019   DERMATOFIBROMA 10/26/2007   ANEMIA, CHRONIC 10/26/2007   SICKLE CELL TRAIT 10/26/2007   ESSENTIAL HYPERTENSION 10/26/2007   GASTROESOPHAGEAL REFLUX DISEASE 10/26/2007   GASTRITIS 10/26/2007   HIATAL HERNIA 10/26/2007   CONSTIPATION, CHRONIC 10/26/2007   GUAIAC POSITIVE STOOL 10/26/2007    THERAPY DIAG:  Chronic right shoulder pain  Muscle weakness   Rationale for Evaluation and Treatment: Rehabilitation  REFERRING DIAG: R R/C repair 3/26   PERTINENT HISTORY: Anemia, sickle cell trait, DM TII, bil  knee replacements   PRECAUTIONS/RESTRICTIONS:   R R/C repair 3/26 supraspinatus with biceps release   OP Date: 12/15/2022  2 weeks 12/29/2022  4 weeks 01/12/2023  6 weeks 01/26/2023  8 weeks 02/09/2023  10 weeks 02/23/2023  12 weeks 03/09/2023     SUBJECTIVE: Continues to reports R shoulder pain with sleep positions.  Rolled onto R shoulder last night and experienced 5/10 pain.  Does not know when her next MD f/u is scheduled.  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   Pain:  Are you having pain? Yes Pain location: R  shoulder NPRS scale:  5/10 to 10/10 Aggravating factors: movement Relieving factors: rest, ice Pain description: aching Stage: Acute Stability: getting better 24 hour pattern: worse with movement   OBJECTIVE: (objective measures completed at initial evaluation unless otherwise dated)   GENERAL OBSERVATION:          Not wearing sling "I forgot it in the car"                                SENSATION:          Light touch: Deficits R C6           PALPATION: Expected TTP R shoulder   UPPER EXTREMITY AROM:   ROM Right 01/06/2023 Left 01/06/2023  Shoulder flexion 150    Shoulder abduction      Shoulder internal rotation      Shoulder external rotation      Functional IR      Functional ER      Shoulder extension      Elbow extension      Elbow flexion        (Blank rows = not tested, N = WNL, * = concordant pain with testing)   UPPER EXTREMITY MMT:   MMT Right 01/06/2023 Left 01/06/2023  Shoulder flexion      Shoulder abduction (C5)      Shoulder ER      Shoulder IR      Middle trapezius      Lower trapezius      Shoulder extension      Grip strength      Cervical flexion (C1,C2)      Cervical S/B (C3)      Shoulder shrug (C4)      Elbow flexion (C6)      Elbow ext (C7)      Thumb ext (C8)      Finger abd (T1)      Grossly        (Blank rows = not tested, score listed is out of 5 possible points.  N = WNL, D = diminished, C = clear for gross weakness with myotome testing, * = concordant pain with testing)     UPPER EXTREMITY PROM:   PROM Right 01/06/2023 Left 01/06/2023 R 01/23/23 R  02/02/23  Shoulder flexion 90+   120 135d  Shoulder abduction      80d P!  Shoulder internal rotation      70d  Shoulder external rotation 20+   30+ 40d P!  Functional IR        Functional ER        Shoulder extension        Elbow extension        Elbow flexion          (Blank  rows = not tested, N = WNL, * = concordant pain with testing)   PATIENT SURVEYS:  FOTO 24 ->  55              TODAY'S TREATMENT:  Creating, reviewing, and completing below HEP     PATIENT EDUCATION:  POC, diagnosis, prognosis, HEP, and outcome measures.  Pt educated via explanation, demonstration, and handout (HEP).  Pt confirms understanding verbally.    HOME EXERCISE PROGRAM: Access Code: [email or web address] URL: [email or web address] Date: 03/05/2023 Prepared by: Alphonzo Severance  Exercises - Ice  - 5 x daily - 7 x weekly - 1 sets - 1 reps - 20 minutes hold - Shoulder Flexion Wall Slide with Towel  - 1 x daily - 7 x weekly - 3 sets - 10 reps - Standing Shoulder Row with Anchored Resistance  - 1 x daily - 7 x weekly - 3 sets - 10 reps - Shoulder extension with resistance - Neutral  - 1 x daily - 7 x weekly - 3 sets - 10 reps - Shoulder External Rotation with Anchored Resistance  - 1 x daily - 7 x weekly - 3 sets - 10 reps   Treatment priorities     Eval (01/06/2023)              Gentle PROM working to 120 d of flexion and 30 deg of ext rotation              precautions                                                            [facility] Adult PT Treatment:                                                DATE: 03/16/23 Therapeutic Exercise: Pulley 4'  UE Ranger standing abduction against wall 30x Prone extension 1000g ball 15x Prone flexion 1000g ball 15x Prone hor abd 1000g ball 15x Prone row 1000g ball 15x Supine flexion 1000g ball 15x Supine press 1000g ball 15x Seated scaption 1000g ball 15x Manual Therapy: Inferior and posterior glides 5x10 to promote flexion and abduction Supine D1 FE PNF 15x   [facility] Adult PT Treatment:                                                DATE: 02/2023 Therapeutic Exercise: Pulley 4'  Prone extension 500g ball 15x Prone flexion 500g ball 15x Prone hor abd 500g ball 15x Prone row 500g ball 15x Supine flexion 500g ball 15x Supine press 500g ball 15x Seated scaption 500g ball 15x UE ranger - x15 flexion - 15x scaption - 15x  abduction @ 28'' Towel slide on wall - 15x  Manual Therapy: Inferior and posterior glides 5x10 to promote flexion and abduction Supine D1 FE PNF 15x    [facility] Adult PT Treatment:  DATE: 03/05/23 Therapeutic Exercise: Pulley 4'  UE ranger - x20 flexion - 20x scaption @ 28'' Towel slide on wall - 20x Row - GTB - 3x10 Shoulder ext - YTB - 3x10 Standing ER with YTB - 3x10  Therapeutic Activity - collecting information for goals, checking progress, and reviewing with patient  [facility] Adult PT Treatment:                                                DATE: 02/09/23 Therapeutic Exercise: 4 way scapula against light manual resistance Supine chest press with dowel - 3x10 Supine non-painful arc flexion with dowel YTB row - 3x10 YTB shoulder - 3x10  Manual Therapy: PROM  Modalities:  Vasopneumatic (Game Ready)    Location:  right shoulder Time:  5 minutes Pressure:  low Temperature:  38 degrees   TREATMENT 5/4:  Therapeutic Exercise: - seated scapular retraction   Manual Therapy: - PROM with gentle oscillations at end range for ER and scaption   ASSESSMENT:   CLINICAL IMPRESSION: Continued reports of subacromial pain and inability to abduct arm fully.  Increased resistance as noted to promote strength an patient able to tolerate increased work w/o increased symptoms.  Pain and guarding persist in SA region, unable to accurately reproduce with palpation.  Resisted IR/ER strength is good but patient unable to hold open can position w/o pain/weakness.  Symptoms suggestive of SA irritation of unknown cause. Recommended patient f/u with surgeon to definitively determine source of ongoing pain at 3 month mark along resulting in altered sleep patterns   Tavie has progressed fair with therapy.  Improved impairments include: shoulder ROM, strength, pain.  Functional improvements include: ability to use R shoulder for very light tasks.   Progressions needed include: progressive strengthening and functional ROM.  Barriers to progress include: pt has only attended 5 pt treatment sessions since surgery and has had poor compliance with precautions and HEP; discussed need for HEP compliance and regular PT to progress today; pt confirms understanding.  Please see GOALS section for progress on short term and long term goals established at evaluation.  I recommend continuation of PT to allow completion of remaining goals and continued functional progression.  OBJECTIVE IMPAIRMENTS: Pain, R shoulder ROM, R shoulder strength   ACTIVITY LIMITATIONS: reaching, lifting, driving   PERSONAL FACTORS: See medical history and pertinent history     REHAB POTENTIAL: Good   CLINICAL DECISION MAKING: Stable/uncomplicated   EVALUATION COMPLEXITY: Low     GOALS:     SHORT TERM GOALS: Target date: 02/03/2023   Audrea will be >75% HEP compliant to improve carryover between sessions and facilitate independent management of condition   Evaluation (01/06/2023): ongoing Goal status: Ongoing due to limited post-op protocol     LONG TERM GOALS: Target date: 03/03/2023 (extended until 04/30/2023)   Camrie will improve FOTO score to 55 as a proxy for functional improvement   Evaluation/Baseline (01/06/2023): 24 6/14: 47 Goal status: Progressing      2.  Sheriah will demonstrate >130 degrees of active ROM in flexion to allow completion of activities involving reaching OH, not limited by pain   Evaluation/Baseline (01/06/2023): 90 degrees PROM 6/14: 80 AROM Goal status: ongoing     3.  Tamzyn will achieve >=60 degrees of shoulder ER at 90 degrees of abduction to allow proper shoulder mechanics during OH movement, not  limited by pain    Evaluation/Baseline (01/06/2023): 20 degrees PROM 6/14: 50 Goal status: ongoing     4.  Talisia will be able to reach repeatedly into cabinet with >/= 3#, not limited by pain   Evaluation/Baseline (01/06/2023):  limited 6/14: unable to reach into cabinet Goal status: ongoing       PLAN: PT FREQUENCY: 1-2x/week   PT DURATION: 8 weeks (Ending 03/03/2023)   PLANNED INTERVENTIONS: Therapeutic exercises, Aquatic therapy, Therapeutic activity, Neuro Muscular re-education, Gait training, Patient/Family education, Joint mobilization, Dry Needling, Electrical stimulation, Spinal mobilization and/or manipulation, Moist heat, Taping, Vasopneumatic device, Ionotophoresis 4mg /ml Dexamethasone, and Manual therapy   Hildred Laser PT 03/16/2023, 1:13 PM

## 2023-03-16 ENCOUNTER — Ambulatory Visit: Payer: Medicare Other

## 2023-03-16 DIAGNOSIS — M6281 Muscle weakness (generalized): Secondary | ICD-10-CM

## 2023-03-16 DIAGNOSIS — M25511 Pain in right shoulder: Secondary | ICD-10-CM | POA: Diagnosis not present

## 2023-03-16 DIAGNOSIS — G8929 Other chronic pain: Secondary | ICD-10-CM | POA: Diagnosis not present

## 2023-03-16 DIAGNOSIS — R600 Localized edema: Secondary | ICD-10-CM | POA: Diagnosis not present

## 2023-03-18 ENCOUNTER — Ambulatory Visit: Payer: Medicare Other

## 2023-03-19 DIAGNOSIS — M25511 Pain in right shoulder: Secondary | ICD-10-CM | POA: Diagnosis not present

## 2023-03-23 ENCOUNTER — Ambulatory Visit: Payer: Medicare Other | Admitting: Physical Therapy

## 2023-03-26 ENCOUNTER — Ambulatory Visit: Payer: Medicare Other

## 2023-03-29 NOTE — Therapy (Unsigned)
PHYSICAL THERAPY TREATMENT NOTE     Patient Name: Taylor Edwards MRN: [id number] DOB:Jul 05, 1941, 82 y.o., female Today's Date: 03/30/2023  PCP: Emilio Aspen, MD   REFERRING PROVIDER: Jones Broom, MD   PT End of Session - 03/30/23 1136     Visit Number 9    Date for PT Re-Evaluation 04/30/23    Authorization Type [employer] MCR - FOTO    PT Start Time 1130    PT Stop Time 1210    PT Time Calculation (min) 40 min    Behavior During Therapy [facility] for tasks assessed/performed               Past Medical History:  Diagnosis Date   Anemia    no current med.   Arthritis    knees   Diabetes mellitus    GERD (gastroesophageal reflux disease)    no current med.   Gout    Hypercholesteremia    Hypertension    has been on med. x 10 yrs.   Hypothyroid    IBS (irritable bowel syndrome)    Post-nasal drip    current cough   Sickle cell trait (HCC)    Urinary urgency    Past Surgical History:  Procedure Laterality Date   ABDOMINAL HYSTERECTOMY  1995   BUNIONECTOMY  08/25/2011   Procedure: Arbutus Leas;  Surgeon: Velna Ochs, MD;  Location: [facility];  Service: Orthopedics;  Laterality: Right;   BUNIONECTOMY Left 08/04/2022   Procedure: LEFT FOOT BUNIONECTOMY;  Surgeon: Marcene Corning, MD;  Location: [facility];  Service: Orthopedics;  Laterality: Left;   CATARACT EXTRACTION  2016   both eyes   COLONOSCOPY W/ BIOPSIES AND POLYPECTOMY     DILATION AND CURETTAGE OF UTERUS     INJECTION KNEE  08/25/2011   Procedure: KNEE INJECTION;  Surgeon: Velna Ochs, MD;  Location: [facility];  Service: Orthopedics;  Laterality: Bilateral;   KNEE ARTHROSCOPY  12/11/2008   bilat., with chondroplasty   KNEE JOINT MANIPULATION  10/30/2009   bilat.   OPEN REDUCTION INTERNAL FIXATION (ORIF) DISTAL RADIAL FRACTURE Left 11/17/2014   Procedure: OPEN REDUCTION INTERNAL FIXATION (ORIF) LEFT DISTAL RADIUS  FRACTURE;  Surgeon: Dairl Ponder, MD;   Location: [facility];  Service: Orthopedics;  Laterality: Left;   SHOULDER ARTHROSCOPY WITH ROTATOR CUFF REPAIR AND SUBACROMIAL DECOMPRESSION Right 12/15/2022   Procedure: SHOULDER ARTHROSCOPY WITH ROTATOR CUFF REPAIR AND SUBACROMIAL DECOMPRESSION;  Surgeon: Jones Broom, MD;  Location:  SUR[facility]ERY [facility];  Service: Orthopedics;  Laterality: Right;   TOTAL KNEE ARTHROPLASTY Bilateral 09/26/2009   bilat.   Patient Active Problem List   Diagnosis Date Noted   Posterior vitreous detachment, both eyes 03/26/2022   Diabetes mellitus without complication (HCC) 03/26/2022   Pseudophakia, both eyes 03/26/2022   History of total knee replacement, left 04/03/2019   History of total knee arthroplasty, right 04/03/2019   DERMATOFIBROMA 10/26/2007   ANEMIA, CHRONIC 10/26/2007   SICKLE CELL TRAIT 10/26/2007   ESSENTIAL HYPERTENSION 10/26/2007   GASTROESOPHAGEAL REFLUX DISEASE 10/26/2007   GASTRITIS 10/26/2007   HIATAL HERNIA 10/26/2007   CONSTIPATION, CHRONIC 10/26/2007   GUAIAC POSITIVE STOOL 10/26/2007    THERAPY DIAG:  Chronic right shoulder pain  Muscle weakness  Muscle weakness (generalized)   Rationale for Evaluation and Treatment: Rehabilitation  REFERRING DIAG: R R/C repair 3/26   PERTINENT HISTORY: Anemia, sickle cell trait, DM TII, bil knee replacements   PRECAUTIONS/RESTRICTIONS:   R R/C repair 3/26 supraspinatus with biceps  release   OP Date: 12/15/2022  2 weeks 12/29/2022  4 weeks 01/12/2023  6 weeks 01/26/2023  8 weeks 02/09/2023  10 weeks 02/23/2023  12 weeks 03/09/2023     SUBJECTIVE: Saw PA and was told she needed more strengthening.  Continues with high levels of pain in R shoulder at SA region, unable to abduct past 90d due to pain.   Pain:  Are you having pain? Yes Pain location: R shoulder NPRS scale:  5/10 to 10/10 Aggravating factors: movement Relieving factors: rest, ice Pain description: aching Stage: Acute Stability: getting better 24 hour  pattern: worse with movement   OBJECTIVE: (objective measures completed at initial evaluation unless otherwise dated)   GENERAL OBSERVATION:          Not wearing sling "I forgot it in the car"                                SENSATION:          Light touch: Deficits R C6           PALPATION: Expected TTP R shoulder   UPPER EXTREMITY AROM:   ROM Right 01/06/2023 Left 01/06/2023 R A/PROM  03/30/23  Shoulder flexion 150   120/120  Shoulder abduction     60/90  Shoulder internal rotation     /80  Shoulder external rotation     /50  Functional IR       Functional ER       Shoulder extension       Elbow extension       Elbow flexion         (Blank rows = not tested, N = WNL, * = concordant pain with testing)   UPPER EXTREMITY MMT:   MMT Right 01/06/2023 Left 01/06/2023  Shoulder flexion      Shoulder abduction (C5)      Shoulder ER      Shoulder IR      Middle trapezius      Lower trapezius      Shoulder extension      Grip strength      Cervical flexion (C1,C2)      Cervical S/B (C3)      Shoulder shrug (C4)      Elbow flexion (C6)      Elbow ext (C7)      Thumb ext (C8)      Finger abd (T1)      Grossly        (Blank rows = not tested, score listed is out of 5 possible points.  N = WNL, D = diminished, C = clear for gross weakness with myotome testing, * = concordant pain with testing)     UPPER EXTREMITY PROM:   PROM Right 01/06/2023 Left 01/06/2023 R 01/23/23 R  02/02/23  Shoulder flexion 90+   120 135d  Shoulder abduction      80d P!  Shoulder internal rotation      70d  Shoulder external rotation 20+   30+ 40d P!  Functional IR        Functional ER        Shoulder extension        Elbow extension        Elbow flexion          (Blank rows = not tested, N = WNL, * = concordant pain with testing)   PATIENT SURVEYS:  FOTO 24 -> 55              TODAY'S TREATMENT:  Creating, reviewing, and completing below HEP     PATIENT EDUCATION:  POC, diagnosis,  prognosis, HEP, and outcome measures.  Pt educated via explanation, demonstration, and handout (HEP).  Pt confirms understanding verbally.    HOME EXERCISE PROGRAM: Access Code: [email or web address] URL: [email or web address] Date: 03/05/2023 Prepared by: Alphonzo Severance  Exercises - Ice  - 5 x daily - 7 x weekly - 1 sets - 1 reps - 20 minutes hold - Shoulder Flexion Wall Slide with Towel  - 1 x daily - 7 x weekly - 3 sets - 10 reps - Standing Shoulder Row with Anchored Resistance  - 1 x daily - 7 x weekly - 3 sets - 10 reps - Shoulder extension with resistance - Neutral  - 1 x daily - 7 x weekly - 3 sets - 10 reps - Shoulder External Rotation with Anchored Resistance  - 1 x daily - 7 x weekly - 3 sets - 10 reps   Treatment priorities     Eval (01/06/2023)              Gentle PROM working to 120 d of flexion and 30 deg of ext rotation              precautions                                                            [facility] Adult PT Treatment:                                                DATE: 03/30/23 Therapeutic Exercise: OH pulleys 5 min Supine flexion 500g ball 15x Supine press 500g ball 15x S/L R abduction 500g ball 15x S/L R ER 500g ball 15x Seated scaption 500g ball 15x Manual Therapy: Inferior and posterior glides 5x10 to promote flexion and abduction Supine D1 FE PNF 15x  [facility] Adult PT Treatment:                                                DATE: 03/16/23 Therapeutic Exercise: Pulley 4'  UE Ranger standing abduction against wall 30x Prone extension 1000g ball 15x Prone flexion 1000g ball 15x Prone hor abd 1000g ball 15x Prone row 1000g ball 15x Supine flexion 1000g ball 15x Supine press 1000g ball 15x Seated scaption 1000g ball 15x Manual Therapy: Inferior and posterior glides 5x10 to promote flexion and abduction Supine D1 FE PNF 15x   [facility] Adult PT Treatment:                                                DATE: 02/2023 Therapeutic Exercise: Pulley 4'   Prone extension 500g ball 15x Prone flexion 500g ball 15x Prone hor abd 500g ball 15x Prone row 500g ball 15x Supine flexion 500g  ball 15x Supine press 500g ball 15x Seated scaption 500g ball 15x UE ranger - x15 flexion - 15x scaption - 15x abduction @ 28'' Towel slide on wall - 15x  Manual Therapy: Inferior and posterior glides 5x10 to promote flexion and abduction Supine D1 FE PNF 15x    [facility] Adult PT Treatment:                                                DATE: 03/05/23 Therapeutic Exercise: Pulley 4'  UE ranger - x20 flexion - 20x scaption @ 28'' Towel slide on wall - 20x Row - GTB - 3x10 Shoulder ext - YTB - 3x10 Standing ER with YTB - 3x10  Therapeutic Activity - collecting information for goals, checking progress, and reviewing with patient  [facility] Adult PT Treatment:                                                DATE: 02/09/23 Therapeutic Exercise: 4 way scapula against light manual resistance Supine chest press with dowel - 3x10 Supine non-painful arc flexion with dowel YTB row - 3x10 YTB shoulder - 3x10  Manual Therapy: PROM  Modalities:  Vasopneumatic (Game Ready)    Location:  right shoulder Time:  5 minutes Pressure:  low Temperature:  38 degrees   TREATMENT 5/4:  Therapeutic Exercise: - seated scapular retraction   Manual Therapy: - PROM with gentle oscillations at end range for ER and scaption   ASSESSMENT:   CLINICAL IMPRESSION: Continued high levels of pain in R shoulder centered at SA region which limits ability to participate in strengthening tasks.  Continued RC strengthening tasks with inability to abduct R arm against gravity.  Unable to abduct arm in scapular plane.   Melesa has progressed fair with therapy.  Improved impairments include: shoulder ROM, strength, pain.  Functional improvements include: ability to use R shoulder for very light tasks.  Progressions needed include: progressive strengthening and functional ROM.  Barriers  to progress include: pt has only attended 5 pt treatment sessions since surgery and has had poor compliance with precautions and HEP; discussed need for HEP compliance and regular PT to progress today; pt confirms understanding.  Please see GOALS section for progress on short term and long term goals established at evaluation.  I recommend continuation of PT to allow completion of remaining goals and continued functional progression.  OBJECTIVE IMPAIRMENTS: Pain, R shoulder ROM, R shoulder strength   ACTIVITY LIMITATIONS: reaching, lifting, driving   PERSONAL FACTORS: See medical history and pertinent history     REHAB POTENTIAL: Good   CLINICAL DECISION MAKING: Stable/uncomplicated   EVALUATION COMPLEXITY: Low     GOALS:     SHORT TERM GOALS: Target date: 02/03/2023   Shavonia will be >75% HEP compliant to improve carryover between sessions and facilitate independent management of condition   Evaluation (01/06/2023): ongoing Goal status: Ongoing due to limited post-op protocol     LONG TERM GOALS: Target date: 03/03/2023 (extended until 04/30/2023)   Tarran will improve FOTO score to 55 as a proxy for functional improvement   Evaluation/Baseline (01/06/2023): 24 6/14: 47 Goal status: Progressing      2.  Karima will demonstrate >130 degrees of active ROM in  flexion to allow completion of activities involving reaching OH, not limited by pain   Evaluation/Baseline (01/06/2023): 90 degrees PROM 6/14: 80 AROM Goal status: ongoing     3.  Aliha will achieve >=60 degrees of shoulder ER at 90 degrees of abduction to allow proper shoulder mechanics during OH movement, not limited by pain    Evaluation/Baseline (01/06/2023): 20 degrees PROM 6/14: 50 Goal status: ongoing     4.  Indika will be able to reach repeatedly into cabinet with >/= 3#, not limited by pain   Evaluation/Baseline (01/06/2023): limited 6/14: unable to reach into cabinet Goal status: ongoing       PLAN: PT  FREQUENCY: 1-2x/week   PT DURATION: 8 weeks (Ending 03/03/2023)   PLANNED INTERVENTIONS: Therapeutic exercises, Aquatic therapy, Therapeutic activity, Neuro Muscular re-education, Gait training, Patient/Family education, Joint mobilization, Dry Needling, Electrical stimulation, Spinal mobilization and/or manipulation, Moist heat, Taping, Vasopneumatic device, Ionotophoresis 4mg /ml Dexamethasone, and Manual therapy   Hildred Laser PT 03/30/2023, 11:37 AM

## 2023-03-30 ENCOUNTER — Ambulatory Visit: Payer: Medicare Other

## 2023-03-30 ENCOUNTER — Encounter (INDEPENDENT_AMBULATORY_CARE_PROVIDER_SITE_OTHER): Payer: BC Managed Care – PPO | Admitting: Ophthalmology

## 2023-03-30 ENCOUNTER — Ambulatory Visit: Payer: Medicare Other | Attending: Internal Medicine

## 2023-03-30 DIAGNOSIS — M25511 Pain in right shoulder: Secondary | ICD-10-CM | POA: Diagnosis not present

## 2023-03-30 DIAGNOSIS — R600 Localized edema: Secondary | ICD-10-CM | POA: Insufficient documentation

## 2023-03-30 DIAGNOSIS — G8929 Other chronic pain: Secondary | ICD-10-CM | POA: Diagnosis not present

## 2023-03-30 DIAGNOSIS — M6281 Muscle weakness (generalized): Secondary | ICD-10-CM | POA: Insufficient documentation

## 2023-03-30 NOTE — Therapy (Signed)
PHYSICAL THERAPY TREATMENT NOTE/10th VISIT PROGRESS NOTE     Patient Name: COPPER KIRTLEY MRN: [id number] DOB:08/16/1941, 82 y.o., female Today's Date: 04/02/2023  PCP: Emilio Aspen, MD   REFERRING PROVIDER: Jones Broom, MD   PT End of Session - 04/02/23 1214     Visit Number 10    Date for PT Re-Evaluation 04/30/23    Authorization Type [employer] MCR - FOTO    Progress Note Due on Visit 10    PT Start Time 1215    PT Stop Time 1300    PT Time Calculation (min) 45 min    Activity Tolerance Patient tolerated treatment well    Behavior During Therapy WFL for tasks assessed/performed                Past Medical History:  Diagnosis Date   Anemia    no current med.   Arthritis    knees   Diabetes mellitus    GERD (gastroesophageal reflux disease)    no current med.   Gout    Hypercholesteremia    Hypertension    has been on med. x 10 yrs.   Hypothyroid    IBS (irritable bowel syndrome)    Post-nasal drip    current cough   Sickle cell trait (HCC)    Urinary urgency    Past Surgical History:  Procedure Laterality Date   ABDOMINAL HYSTERECTOMY  1995   BUNIONECTOMY  08/25/2011   Procedure: Arbutus Leas;  Surgeon: Velna Ochs, MD;  Location: [facility];  Service: Orthopedics;  Laterality: Right;   BUNIONECTOMY Left 08/04/2022   Procedure: LEFT FOOT BUNIONECTOMY;  Surgeon: Marcene Corning, MD;  Location: [facility];  Service: Orthopedics;  Laterality: Left;   CATARACT EXTRACTION  2016   both eyes   COLONOSCOPY W/ BIOPSIES AND POLYPECTOMY     DILATION AND CURETTAGE OF UTERUS     INJECTION KNEE  08/25/2011   Procedure: KNEE INJECTION;  Surgeon: Velna Ochs, MD;  Location: [facility];  Service: Orthopedics;  Laterality: Bilateral;   KNEE ARTHROSCOPY  12/11/2008   bilat., with chondroplasty   KNEE JOINT MANIPULATION  10/30/2009   bilat.   OPEN REDUCTION INTERNAL FIXATION (ORIF) DISTAL RADIAL FRACTURE Left 11/17/2014    Procedure: OPEN REDUCTION INTERNAL FIXATION (ORIF) LEFT DISTAL RADIUS  FRACTURE;  Surgeon: Dairl Ponder, MD;  Location: [facility];  Service: Orthopedics;  Laterality: Left;   SHOULDER ARTHROSCOPY WITH ROTATOR CUFF REPAIR AND SUBACROMIAL DECOMPRESSION Right 12/15/2022   Procedure: SHOULDER ARTHROSCOPY WITH ROTATOR CUFF REPAIR AND SUBACROMIAL DECOMPRESSION;  Surgeon: Jones Broom, MD;  Location: [facility];  Service: Orthopedics;  Laterality: Right;   TOTAL KNEE ARTHROPLASTY Bilateral 09/26/2009   bilat.   Patient Active Problem List   Diagnosis Date Noted   Posterior vitreous detachment, both eyes 03/26/2022   Diabetes mellitus without complication (HCC) 03/26/2022   Pseudophakia, both eyes 03/26/2022   History of total knee replacement, left 04/03/2019   History of total knee arthroplasty, right 04/03/2019   DERMATOFIBROMA 10/26/2007   ANEMIA, CHRONIC 10/26/2007   SICKLE CELL TRAIT 10/26/2007   ESSENTIAL HYPERTENSION 10/26/2007   GASTROESOPHAGEAL REFLUX DISEASE 10/26/2007   GASTRITIS 10/26/2007   HIATAL HERNIA 10/26/2007   CONSTIPATION, CHRONIC 10/26/2007   GUAIAC POSITIVE STOOL 10/26/2007    THERAPY DIAG:  Chronic right shoulder pain  Muscle weakness   Rationale for Evaluation and Treatment: Rehabilitation  REFERRING DIAG: R R/C repair 3/26   PERTINENT HISTORY: Anemia, sickle cell  trait, DM TII, bil knee replacements   PRECAUTIONS/RESTRICTIONS:   R R/C repair 3/26 supraspinatus with biceps release   OP Date: 12/15/2022  2 weeks 12/29/2022  4 weeks 01/12/2023  6 weeks 01/26/2023  8 weeks 02/09/2023  10 weeks 02/23/2023  12 weeks 03/09/2023     SUBJECTIVE: No changes in pain levels, 7-8/10 at baseline, decreases to 5/10 after pain pill   Pain:  Are you having pain? Yes Pain location: R shoulder NPRS scale:  5/10 to 10/10 Aggravating factors: movement Relieving factors: rest, ice Pain description: aching Stage: Acute Stability: getting better 24  hour pattern: worse with movement   OBJECTIVE: (objective measures completed at initial evaluation unless otherwise dated)   GENERAL OBSERVATION:          Not wearing sling "I forgot it in the car"                                SENSATION:          Light touch: Deficits R C6           PALPATION: Expected TTP R shoulder   UPPER EXTREMITY AROM:   ROM Right 01/06/2023 Left 01/06/2023 R A/PROM  03/30/23  Shoulder flexion 150   120/120  Shoulder abduction     60/90  Shoulder internal rotation     /80  Shoulder external rotation     /50  Functional IR       Functional ER       Shoulder extension       Elbow extension       Elbow flexion         (Blank rows = not tested, N = WNL, * = concordant pain with testing)   UPPER EXTREMITY MMT:   MMT Right 01/06/2023 Left 01/06/2023  Shoulder flexion      Shoulder abduction (C5)      Shoulder ER      Shoulder IR      Middle trapezius      Lower trapezius      Shoulder extension      Grip strength      Cervical flexion (C1,C2)      Cervical S/B (C3)      Shoulder shrug (C4)      Elbow flexion (C6)      Elbow ext (C7)      Thumb ext (C8)      Finger abd (T1)      Grossly        (Blank rows = not tested, score listed is out of 5 possible points.  N = WNL, D = diminished, C = clear for gross weakness with myotome testing, * = concordant pain with testing)     UPPER EXTREMITY PROM:   PROM Right 01/06/2023 Left 01/06/2023 R 01/23/23 R  02/02/23 R  04/02/23  Shoulder flexion 90+   120 135d 135  Shoulder abduction      80d P! 80P!  Shoulder internal rotation      70d   Shoulder external rotation 20+   30+ 40d P! 62d @90d  ABD  Functional IR         Functional ER         Shoulder extension         Elbow extension         Elbow flexion           (Blank rows =  not tested, N = WNL, * = concordant pain with testing)   PATIENT SURVEYS:  FOTO 24 -> 55; 04/02/23 44              TODAY'S TREATMENT:  Creating, reviewing, and  completing below HEP     PATIENT EDUCATION:  POC, diagnosis, prognosis, HEP, and outcome measures.  Pt educated via explanation, demonstration, and handout (HEP).  Pt confirms understanding verbally.    HOME EXERCISE PROGRAM: Access Code: [email or web address] URL: [email or web address] Date: 03/05/2023 Prepared by: Alphonzo Severance  Exercises - Ice  - 5 x daily - 7 x weekly - 1 sets - 1 reps - 20 minutes hold - Shoulder Flexion Wall Slide with Towel  - 1 x daily - 7 x weekly - 3 sets - 10 reps - Standing Shoulder Row with Anchored Resistance  - 1 x daily - 7 x weekly - 3 sets - 10 reps - Shoulder extension with resistance - Neutral  - 1 x daily - 7 x weekly - 3 sets - 10 reps - Shoulder External Rotation with Anchored Resistance  - 1 x daily - 7 x weekly - 3 sets - 10 reps   Treatment priorities     Eval (01/06/2023)              Gentle PROM working to 120 d of flexion and 30 deg of ext rotation              precautions                                                           [facility] Adult PT Treatment:                                                DATE: 04/02/23 Therapeutic Exercise: Nustep L2 6 min Supine flexion 500g ball 15x2 Supine press 500g ball 15x2 S/L R abduction 500g ball 15x2 S/L R ER 500g ball 15x2 Seated scaption 500g ball 15x2 Prone flexion 500g ball 15x2 Prone extension 500g ball 15x2 Prone row 500g ball 15x2 Prone hor abduction 500g ball 15x2 Supine rhythmic stabilization 90d flexion 60s x2  [facility] Adult PT Treatment:                                                DATE: 03/30/23 Therapeutic Exercise: OH pulleys 5 min Supine flexion 500g ball 15x Supine press 500g ball 15x S/L R abduction 500g ball 15x S/L R ER 500g ball 15x Seated scaption 500g ball 15x Manual Therapy: Inferior and posterior glides 5x10 to promote flexion and abduction Supine D1 FE PNF 15x  [facility] Adult PT Treatment:                                                DATE:  03/16/23 Therapeutic Exercise: Pulley 4'  UE Ranger standing abduction against wall 30x Prone extension 1000g ball 15x  Prone flexion 1000g ball 15x Prone hor abd 1000g ball 15x Prone row 1000g ball 15x Supine flexion 1000g ball 15x Supine press 1000g ball 15x Seated scaption 1000g ball 15x Manual Therapy: Inferior and posterior glides 5x10 to promote flexion and abduction Supine D1 FE PNF 15x   [facility] Adult PT Treatment:                                                DATE: 02/2023 Therapeutic Exercise: Pulley 4'  Prone extension 500g ball 15x Prone flexion 500g ball 15x Prone hor abd 500g ball 15x Prone row 500g ball 15x Supine flexion 500g ball 15x Supine press 500g ball 15x Seated scaption 500g ball 15x UE ranger - x15 flexion - 15x scaption - 15x abduction @ 28'' Towel slide on wall - 15x  Manual Therapy: Inferior and posterior glides 5x10 to promote flexion and abduction Supine D1 FE PNF 15x    [facility] Adult PT Treatment:                                                DATE: 03/05/23 Therapeutic Exercise: Pulley 4'  UE ranger - x20 flexion - 20x scaption @ 28'' Towel slide on wall - 20x Row - GTB - 3x10 Shoulder ext - YTB - 3x10 Standing ER with YTB - 3x10  Therapeutic Activity - collecting information for goals, checking progress, and reviewing with patient  [facility] Adult PT Treatment:                                                DATE: 02/09/23 Therapeutic Exercise: 4 way scapula against light manual resistance Supine chest press with dowel - 3x10 Supine non-painful arc flexion with dowel YTB row - 3x10 YTB shoulder - 3x10  Manual Therapy: PROM  Modalities:  Vasopneumatic (Game Ready)    Location:  right shoulder Time:  5 minutes Pressure:  low Temperature:  38 degrees   TREATMENT 5/4:  Therapeutic Exercise: - seated scapular retraction   Manual Therapy: - PROM with gentle oscillations at end range for ER and scaption   ASSESSMENT:   CLINICAL  IMPRESSION: Todays session emphasized strengthening and stabilization.  Refrained from manual techniques due to limited response to date.  Goals addressed as noted.  FOTO score declined slightly, ER at 90D abduction >60d.  Pain levels remain elevated 7-8/10 baseline.  Patient seen for 10 PT treatments since 3/26 shoulder surgery.  Reinforced scaption to restore normal scapulohumeral rhythm and decrease substitution pattern   Jullie has progressed fair with therapy.  Improved impairments include: shoulder ROM, strength, pain.  Functional improvements include: ability to use R shoulder for very light tasks.  Progressions needed include: progressive strengthening and functional ROM.  Barriers to progress include: pt has only attended 5 pt treatment sessions since surgery and has had poor compliance with precautions and HEP; discussed need for HEP compliance and regular PT to progress today; pt confirms understanding.  Please see GOALS section for progress on short term and long term goals established at evaluation.  I recommend continuation of PT to allow completion  of remaining goals and continued functional progression.  OBJECTIVE IMPAIRMENTS: Pain, R shoulder ROM, R shoulder strength   ACTIVITY LIMITATIONS: reaching, lifting, driving   PERSONAL FACTORS: See medical history and pertinent history     REHAB POTENTIAL: Good   CLINICAL DECISION MAKING: Stable/uncomplicated   EVALUATION COMPLEXITY: Low     GOALS:     SHORT TERM GOALS: Target date: 02/03/2023   Kamarri will be >75% HEP compliant to improve carryover between sessions and facilitate independent management of condition   Evaluation (01/06/2023): ongoing Goal status: Ongoing due to limited post-op protocol     LONG TERM GOALS: Target date: 03/03/2023 (extended until 04/30/2023)   Aislin will improve FOTO score to 55 as a proxy for functional improvement   Evaluation/Baseline (01/06/2023): 24;  6/14: 47 04/02/23 44 Goal status:  Ongoing     2.  Giani will demonstrate >130 degrees of active ROM in flexion to allow completion of activities involving reaching OH, not limited by pain   Evaluation/Baseline (01/06/2023): 90 degrees PROM 6/14: 80 AROM 04/02/23 90d AROM Goal status: ongoing     3.  Marlissa will achieve >=60 degrees of shoulder ER at 90 degrees of abduction to allow proper shoulder mechanics during OH movement, not limited by pain    Evaluation/Baseline (01/06/2023): 20 degrees PROM 6/14: 50 04/02/23 62d Goal status: Met     4.  Samaia will be able to reach repeatedly into cabinet with >/= 3#, not limited by pain   Evaluation/Baseline (01/06/2023): limited 6/14: unable to reach into cabinet Goal status: ongoing       PLAN: PT FREQUENCY: 1-2x/week   PT DURATION: 8 weeks (Ending 04/30/2023)   PLANNED INTERVENTIONS: Therapeutic exercises, Aquatic therapy, Therapeutic activity, Neuro Muscular re-education, Gait training, Patient/Family education, Joint mobilization, Dry Needling, Electrical stimulation, Spinal mobilization and/or manipulation, Moist heat, Taping, Vasopneumatic device, Ionotophoresis 4mg /ml Dexamethasone, and Manual therapy   Hildred Laser PT 04/02/2023, 12:58 PM

## 2023-03-31 DIAGNOSIS — E119 Type 2 diabetes mellitus without complications: Secondary | ICD-10-CM | POA: Diagnosis not present

## 2023-03-31 DIAGNOSIS — H43813 Vitreous degeneration, bilateral: Secondary | ICD-10-CM | POA: Diagnosis not present

## 2023-04-02 ENCOUNTER — Ambulatory Visit: Payer: Medicare Other | Admitting: Physical Therapy

## 2023-04-02 ENCOUNTER — Ambulatory Visit: Payer: Medicare Other

## 2023-04-02 DIAGNOSIS — G8929 Other chronic pain: Secondary | ICD-10-CM

## 2023-04-02 DIAGNOSIS — M25511 Pain in right shoulder: Secondary | ICD-10-CM | POA: Diagnosis not present

## 2023-04-02 DIAGNOSIS — M6281 Muscle weakness (generalized): Secondary | ICD-10-CM

## 2023-04-02 DIAGNOSIS — R600 Localized edema: Secondary | ICD-10-CM | POA: Diagnosis not present

## 2023-04-05 DIAGNOSIS — N3281 Overactive bladder: Secondary | ICD-10-CM | POA: Diagnosis not present

## 2023-04-05 DIAGNOSIS — N1831 Chronic kidney disease, stage 3a: Secondary | ICD-10-CM | POA: Diagnosis not present

## 2023-04-05 DIAGNOSIS — E1122 Type 2 diabetes mellitus with diabetic chronic kidney disease: Secondary | ICD-10-CM | POA: Diagnosis not present

## 2023-04-05 DIAGNOSIS — G8929 Other chronic pain: Secondary | ICD-10-CM | POA: Diagnosis not present

## 2023-04-05 DIAGNOSIS — R350 Frequency of micturition: Secondary | ICD-10-CM | POA: Diagnosis not present

## 2023-04-05 DIAGNOSIS — I700 Atherosclerosis of aorta: Secondary | ICD-10-CM | POA: Diagnosis not present

## 2023-04-05 DIAGNOSIS — R911 Solitary pulmonary nodule: Secondary | ICD-10-CM | POA: Diagnosis not present

## 2023-04-05 DIAGNOSIS — M25511 Pain in right shoulder: Secondary | ICD-10-CM | POA: Diagnosis not present

## 2023-04-05 NOTE — Therapy (Signed)
PHYSICAL THERAPY TREATMENT NOTE/10th VISIT PROGRESS NOTE     Patient Name: Taylor Edwards MRN: [id number] DOB:10-03-1940, 82 y.o., female Today's Date: 04/06/2023  PCP: Emilio Aspen, MD   REFERRING PROVIDER: Jones Broom, MD   PT End of Session - 04/06/23 1134     Visit Number 11    Date for PT Re-Evaluation 04/30/23    Authorization Type [employer] MCR - FOTO    Progress Note Due on Visit 10    PT Start Time 1135    PT Stop Time 1215    PT Time Calculation (min) 40 min    Activity Tolerance Patient tolerated treatment well    Behavior During Therapy WFL for tasks assessed/performed                 Past Medical History:  Diagnosis Date   Anemia    no current med.   Arthritis    knees   Diabetes mellitus    GERD (gastroesophageal reflux disease)    no current med.   Gout    Hypercholesteremia    Hypertension    has been on med. x 10 yrs.   Hypothyroid    IBS (irritable bowel syndrome)    Post-nasal drip    current cough   Sickle cell trait (HCC)    Urinary urgency    Past Surgical History:  Procedure Laterality Date   ABDOMINAL HYSTERECTOMY  1995   BUNIONECTOMY  08/25/2011   Procedure: Arbutus Leas;  Surgeon: Velna Ochs, MD;  Location: [facility];  Service: Orthopedics;  Laterality: Right;   BUNIONECTOMY Left 08/04/2022   Procedure: LEFT FOOT BUNIONECTOMY;  Surgeon: Marcene Corning, MD;  Location: [facility];  Service: Orthopedics;  Laterality: Left;   CATARACT EXTRACTION  2016   both eyes   COLONOSCOPY W/ BIOPSIES AND POLYPECTOMY     DILATION AND CURETTAGE OF UTERUS     INJECTION KNEE  08/25/2011   Procedure: KNEE INJECTION;  Surgeon: Velna Ochs, MD;  Location: [facility];  Service: Orthopedics;  Laterality: Bilateral;   KNEE ARTHROSCOPY  12/11/2008   bilat., with chondroplasty   KNEE JOINT MANIPULATION  10/30/2009   bilat.   OPEN REDUCTION INTERNAL FIXATION (ORIF) DISTAL RADIAL FRACTURE Left  11/17/2014   Procedure: OPEN REDUCTION INTERNAL FIXATION (ORIF) LEFT DISTAL RADIUS  FRACTURE;  Surgeon: Dairl Ponder, MD;  Location: [facility];  Service: Orthopedics;  Laterality: Left;   SHOULDER ARTHROSCOPY WITH ROTATOR CUFF REPAIR AND SUBACROMIAL DECOMPRESSION Right 12/15/2022   Procedure: SHOULDER ARTHROSCOPY WITH ROTATOR CUFF REPAIR AND SUBACROMIAL DECOMPRESSION;  Surgeon: Jones Broom, MD;  Location: [facility];  Service: Orthopedics;  Laterality: Right;   TOTAL KNEE ARTHROPLASTY Bilateral 09/26/2009   bilat.   Patient Active Problem List   Diagnosis Date Noted   Posterior vitreous detachment, both eyes 03/26/2022   Diabetes mellitus without complication (HCC) 03/26/2022   Pseudophakia, both eyes 03/26/2022   History of total knee replacement, left 04/03/2019   History of total knee arthroplasty, right 04/03/2019   DERMATOFIBROMA 10/26/2007   ANEMIA, CHRONIC 10/26/2007   SICKLE CELL TRAIT 10/26/2007   ESSENTIAL HYPERTENSION 10/26/2007   GASTROESOPHAGEAL REFLUX DISEASE 10/26/2007   GASTRITIS 10/26/2007   HIATAL HERNIA 10/26/2007   CONSTIPATION, CHRONIC 10/26/2007   GUAIAC POSITIVE STOOL 10/26/2007    THERAPY DIAG:  Muscle weakness  Chronic right shoulder pain  Muscle weakness (generalized)   Rationale for Evaluation and Treatment: Rehabilitation  REFERRING DIAG: R R/C repair 3/26  PERTINENT HISTORY: Anemia, sickle cell trait, DM TII, bil knee replacements   PRECAUTIONS/RESTRICTIONS:   R R/C repair 3/26 supraspinatus with biceps release   OP Date: 12/15/2022  2 weeks 12/29/2022  4 weeks 01/12/2023  6 weeks 01/26/2023  8 weeks 02/09/2023  10 weeks 02/23/2023  12 weeks 03/09/2023     SUBJECTIVE: Saw PCP who is concerned about her pain and has referred patient back to surgeon for 7/24 f/u   Pain:  Are you having pain? Yes Pain location: R shoulder NPRS scale:  5/10 to 10/10 Aggravating factors: movement Relieving factors: rest, ice Pain  description: aching Stage: Acute Stability: getting better 24 hour pattern: worse with movement   OBJECTIVE: (objective measures completed at initial evaluation unless otherwise dated)   GENERAL OBSERVATION:          Not wearing sling "I forgot it in the car"                                SENSATION:          Light touch: Deficits R C6           PALPATION: Expected TTP R shoulder   UPPER EXTREMITY AROM:   ROM Right 01/06/2023 Left 01/06/2023 R A/PROM  03/30/23  Shoulder flexion 150   120/120  Shoulder abduction     60/90  Shoulder internal rotation     /80  Shoulder external rotation     /50  Functional IR       Functional ER       Shoulder extension       Elbow extension       Elbow flexion         (Blank rows = not tested, N = WNL, * = concordant pain with testing)   UPPER EXTREMITY MMT:   MMT Right 01/06/2023 Left 01/06/2023  Shoulder flexion      Shoulder abduction (C5)      Shoulder ER      Shoulder IR      Middle trapezius      Lower trapezius      Shoulder extension      Grip strength      Cervical flexion (C1,C2)      Cervical S/B (C3)      Shoulder shrug (C4)      Elbow flexion (C6)      Elbow ext (C7)      Thumb ext (C8)      Finger abd (T1)      Grossly        (Blank rows = not tested, score listed is out of 5 possible points.  N = WNL, D = diminished, C = clear for gross weakness with myotome testing, * = concordant pain with testing)     UPPER EXTREMITY PROM:   PROM Right 01/06/2023 Left 01/06/2023 R 01/23/23 R  02/02/23 R  04/02/23  Shoulder flexion 90+   120 135d 135  Shoulder abduction      80d P! 80P!  Shoulder internal rotation      70d   Shoulder external rotation 20+   30+ 40d P! 62d @90d  ABD  Functional IR         Functional ER         Shoulder extension         Elbow extension         Elbow flexion           (  Blank rows = not tested, N = WNL, * = concordant pain with testing)   PATIENT SURVEYS:  FOTO 24 -> 55; 04/02/23 44               TODAY'S TREATMENT:  Creating, reviewing, and completing below HEP     PATIENT EDUCATION:  POC, diagnosis, prognosis, HEP, and outcome measures.  Pt educated via explanation, demonstration, and handout (HEP).  Pt confirms understanding verbally.    HOME EXERCISE PROGRAM: Access Code: [email or web address] URL: [email or web address] Date: 03/05/2023 Prepared by: Alphonzo Severance  Exercises - Ice  - 5 x daily - 7 x weekly - 1 sets - 1 reps - 20 minutes hold - Shoulder Flexion Wall Slide with Towel  - 1 x daily - 7 x weekly - 3 sets - 10 reps - Standing Shoulder Row with Anchored Resistance  - 1 x daily - 7 x weekly - 3 sets - 10 reps - Shoulder extension with resistance - Neutral  - 1 x daily - 7 x weekly - 3 sets - 10 reps - Shoulder External Rotation with Anchored Resistance  - 1 x daily - 7 x weekly - 3 sets - 10 reps   Treatment priorities     Eval (01/06/2023)              Gentle PROM working to 120 d of flexion and 30 deg of ext rotation              precautions                                                            [facility] Adult PT Treatment:                                                DATE: 04/06/23 Therapeutic Exercise: Nustep L2 6 min Supine flexion 1000g ball 10x2 Supine press 1000g ball 10x2 S/L R abduction 1000g ball 10x, 500g ball 10x S/L R ER 1000g ball 10x2 Seated scaption 500g ball 15x2 Prone flexion 1000g ball 10x2 Prone extension 1000g ball 10x2 Prone row 1000g ball 10x2 Prone hor abduction 1000g ball 10x2 Supine rhythmic stabilization 90d flexion 60s x2  [facility] Adult PT Treatment:                                                DATE: 04/02/23 Therapeutic Exercise: Nustep L2 6 min Supine flexion 500g ball 15x2 Supine press 500g ball 15x2 S/L R abduction 500g ball 15x2 S/L R ER 500g ball 15x2 Seated scaption 500g ball 15x2 Prone flexion 500g ball 15x2 Prone extension 500g ball 15x2 Prone row 500g ball 15x2 Prone hor abduction 500g ball  15x2 Supine rhythmic stabilization 90d flexion 60s x2  [facility] Adult PT Treatment:                                                DATE:  03/30/23 Therapeutic Exercise: OH pulleys 5 min Supine flexion 500g ball 15x Supine press 500g ball 15x S/L R abduction 500g ball 15x S/L R ER 500g ball 15x Seated scaption 500g ball 15x Manual Therapy: Inferior and posterior glides 5x10 to promote flexion and abduction Supine D1 FE PNF 15x  [facility] Adult PT Treatment:                                                DATE: 03/16/23 Therapeutic Exercise: Pulley 4'  UE Ranger standing abduction against wall 30x Prone extension 1000g ball 15x Prone flexion 1000g ball 15x Prone hor abd 1000g ball 15x Prone row 1000g ball 15x Supine flexion 1000g ball 15x Supine press 1000g ball 15x Seated scaption 1000g ball 15x Manual Therapy: Inferior and posterior glides 5x10 to promote flexion and abduction Supine D1 FE PNF 15x   [facility] Adult PT Treatment:                                                DATE: 02/2023 Therapeutic Exercise: Pulley 4'  Prone extension 500g ball 15x Prone flexion 500g ball 15x Prone hor abd 500g ball 15x Prone row 500g ball 15x Supine flexion 500g ball 15x Supine press 500g ball 15x Seated scaption 500g ball 15x UE ranger - x15 flexion - 15x scaption - 15x abduction @ 28'' Towel slide on wall - 15x  Manual Therapy: Inferior and posterior glides 5x10 to promote flexion and abduction Supine D1 FE PNF 15x    [facility] Adult PT Treatment:                                                DATE: 03/05/23 Therapeutic Exercise: Pulley 4'  UE ranger - x20 flexion - 20x scaption @ 28'' Towel slide on wall - 20x Row - GTB - 3x10 Shoulder ext - YTB - 3x10 Standing ER with YTB - 3x10  Therapeutic Activity - collecting information for goals, checking progress, and reviewing with patient  [facility] Adult PT Treatment:                                                DATE: 02/09/23 Therapeutic Exercise: 4  way scapula against light manual resistance Supine chest press with dowel - 3x10 Supine non-painful arc flexion with dowel YTB row - 3x10 YTB shoulder - 3x10  Manual Therapy: PROM  Modalities:  Vasopneumatic (Game Ready)    Location:  right shoulder Time:  5 minutes Pressure:  low Temperature:  38 degrees   TREATMENT 5/4:  Therapeutic Exercise: - seated scapular retraction   Manual Therapy: - PROM with gentle oscillations at end range for ER and scaption   ASSESSMENT:   CLINICAL IMPRESSION: No changes since last session.  Continued to focus on strength to regain ROM and function.  Increased weight and resistance as noted, decreasing reps to 10.  Weakness noted with S/L abduction but patient able to complete all tasks.  Symptoms  localized to SA region an dis point tender to distal acromion.   Nekeya has progressed fair with therapy.  Improved impairments include: shoulder ROM, strength, pain.  Functional improvements include: ability to use R shoulder for very light tasks.  Progressions needed include: progressive strengthening and functional ROM.  Barriers to progress include: pt has only attended 5 pt treatment sessions since surgery and has had poor compliance with precautions and HEP; discussed need for HEP compliance and regular PT to progress today; pt confirms understanding.  Please see GOALS section for progress on short term and long term goals established at evaluation.  I recommend continuation of PT to allow completion of remaining goals and continued functional progression.  OBJECTIVE IMPAIRMENTS: Pain, R shoulder ROM, R shoulder strength   ACTIVITY LIMITATIONS: reaching, lifting, driving   PERSONAL FACTORS: See medical history and pertinent history     REHAB POTENTIAL: Good   CLINICAL DECISION MAKING: Stable/uncomplicated   EVALUATION COMPLEXITY: Low     GOALS:     SHORT TERM GOALS: Target date: 02/03/2023   Mason will be >75% HEP compliant to improve  carryover between sessions and facilitate independent management of condition   Evaluation (01/06/2023): ongoing Goal status: Ongoing due to limited post-op protocol     LONG TERM GOALS: Target date: 03/03/2023 (extended until 04/30/2023)   Raevyn will improve FOTO score to 55 as a proxy for functional improvement   Evaluation/Baseline (01/06/2023): 24;  6/14: 47 04/02/23 44 Goal status: Ongoing     2.  Lari will demonstrate >130 degrees of active ROM in flexion to allow completion of activities involving reaching OH, not limited by pain   Evaluation/Baseline (01/06/2023): 90 degrees PROM 6/14: 80 AROM 04/02/23 90d AROM Goal status: ongoing     3.  Dasja will achieve >=60 degrees of shoulder ER at 90 degrees of abduction to allow proper shoulder mechanics during OH movement, not limited by pain    Evaluation/Baseline (01/06/2023): 20 degrees PROM 6/14: 50 04/02/23 62d Goal status: Met     4.  Shereece will be able to reach repeatedly into cabinet with >/= 3#, not limited by pain   Evaluation/Baseline (01/06/2023): limited 6/14: unable to reach into cabinet Goal status: ongoing       PLAN: PT FREQUENCY: 1-2x/week   PT DURATION: 8 weeks (Ending 04/30/2023)   PLANNED INTERVENTIONS: Therapeutic exercises, Aquatic therapy, Therapeutic activity, Neuro Muscular re-education, Gait training, Patient/Family education, Joint mobilization, Dry Needling, Electrical stimulation, Spinal mobilization and/or manipulation, Moist heat, Taping, Vasopneumatic device, Ionotophoresis 4mg /ml Dexamethasone, and Manual therapy   Hildred Laser PT 04/06/2023, 12:15 PM

## 2023-04-06 ENCOUNTER — Ambulatory Visit: Payer: Medicare Other

## 2023-04-06 DIAGNOSIS — G8929 Other chronic pain: Secondary | ICD-10-CM | POA: Diagnosis not present

## 2023-04-06 DIAGNOSIS — M6281 Muscle weakness (generalized): Secondary | ICD-10-CM | POA: Diagnosis not present

## 2023-04-06 DIAGNOSIS — M25511 Pain in right shoulder: Secondary | ICD-10-CM | POA: Diagnosis not present

## 2023-04-06 DIAGNOSIS — R600 Localized edema: Secondary | ICD-10-CM | POA: Diagnosis not present

## 2023-04-08 ENCOUNTER — Other Ambulatory Visit: Payer: Self-pay

## 2023-04-08 MED ORDER — CEFPODOXIME PROXETIL 100 MG PO TABS
100.0000 mg | ORAL_TABLET | Freq: Two times a day (BID) | ORAL | 0 refills | Status: DC
  Filled 2023-04-08: qty 10, 5d supply, fill #0

## 2023-04-09 ENCOUNTER — Other Ambulatory Visit: Payer: Self-pay

## 2023-04-12 ENCOUNTER — Other Ambulatory Visit (HOSPITAL_COMMUNITY): Payer: Self-pay

## 2023-04-12 ENCOUNTER — Other Ambulatory Visit: Payer: Self-pay

## 2023-04-12 NOTE — Therapy (Unsigned)
PHYSICAL THERAPY TREATMENT NOTE/10th VISIT PROGRESS NOTE     Patient Name: Taylor Edwards MRN: [id number] DOB:[birth year], 82 y.o., female Today's Date: 04/13/2023  PCP: Emilio Aspen, MD   REFERRING PROVIDER: Jones Broom, MD   PT End of Session - 04/13/23 1132     Visit Number 12    Date for PT Re-Evaluation 04/30/23    Authorization Type [employer] MCR - FOTO    Progress Note Due on Visit 10    PT Start Time 1130    PT Stop Time 1210    PT Time Calculation (min) 40 min    Activity Tolerance Patient tolerated treatment well    Behavior During Therapy WFL for tasks assessed/performed                  Past Medical History:  Diagnosis Date   Anemia    no current med.   Arthritis    knees   Diabetes mellitus    GERD (gastroesophageal reflux disease)    no current med.   Gout    Hypercholesteremia    Hypertension    has been on med. x 10 yrs.   Hypothyroid    IBS (irritable bowel syndrome)    Post-nasal drip    current cough   Sickle cell trait (HCC)    Urinary urgency    Past Surgical History:  Procedure Laterality Date   ABDOMINAL HYSTERECTOMY  1995   BUNIONECTOMY  08/25/2011   Procedure: Arbutus Leas;  Surgeon: Velna Ochs, MD;  Location: [facility];  Service: Orthopedics;  Laterality: Right;   BUNIONECTOMY Left 08/04/2022   Procedure: LEFT FOOT BUNIONECTOMY;  Surgeon: Marcene Corning, MD;  Location: [facility];  Service: Orthopedics;  Laterality: Left;   CATARACT EXTRACTION  2016   both eyes   COLONOSCOPY W/ BIOPSIES AND POLYPECTOMY     DILATION AND CURETTAGE OF UTERUS     INJECTION KNEE  08/25/2011   Procedure: KNEE INJECTION;  Surgeon: Velna Ochs, MD;  Location: [facility];  Service: Orthopedics;  Laterality: Bilateral;   KNEE ARTHROSCOPY  12/11/2008   bilat., with chondroplasty   KNEE JOINT MANIPULATION  10/30/2009   bilat.   OPEN REDUCTION INTERNAL FIXATION (ORIF) DISTAL RADIAL FRACTURE Left  11/17/2014   Procedure: OPEN REDUCTION INTERNAL FIXATION (ORIF) LEFT DISTAL RADIUS  FRACTURE;  Surgeon: Dairl Ponder, MD;  Location: [facility];  Service: Orthopedics;  Laterality: Left;   SHOULDER ARTHROSCOPY WITH ROTATOR CUFF REPAIR AND SUBACROMIAL DECOMPRESSION Right 12/15/2022   Procedure: SHOULDER ARTHROSCOPY WITH ROTATOR CUFF REPAIR AND SUBACROMIAL DECOMPRESSION;  Surgeon: Jones Broom, MD;  Location: [facility];  Service: Orthopedics;  Laterality: Right;   TOTAL KNEE ARTHROPLASTY Bilateral 09/26/2009   bilat.   Patient Active Problem List   Diagnosis Date Noted   Posterior vitreous detachment, both eyes 03/26/2022   Diabetes mellitus without complication (HCC) 03/26/2022   Pseudophakia, both eyes 03/26/2022   History of total knee replacement, left 04/03/2019   History of total knee arthroplasty, right 04/03/2019   DERMATOFIBROMA 10/26/2007   ANEMIA, CHRONIC 10/26/2007   SICKLE CELL TRAIT 10/26/2007   ESSENTIAL HYPERTENSION 10/26/2007   GASTROESOPHAGEAL REFLUX DISEASE 10/26/2007   GASTRITIS 10/26/2007   HIATAL HERNIA 10/26/2007   CONSTIPATION, CHRONIC 10/26/2007   GUAIAC POSITIVE STOOL 10/26/2007    THERAPY DIAG:  Muscle weakness  Chronic right shoulder pain  Muscle weakness (generalized)  Localized edema   Rationale for Evaluation and Treatment: Rehabilitation  REFERRING DIAG: R R/C  repair 3/26   PERTINENT HISTORY: Anemia, sickle cell trait, DM TII, bil knee replacements   PRECAUTIONS/RESTRICTIONS:   R R/C repair 3/26 supraspinatus with biceps release   OP Date: 12/15/2022  2 weeks 12/29/2022  4 weeks 01/12/2023  6 weeks 01/26/2023  8 weeks 02/09/2023  10 weeks 02/23/2023  12 weeks 03/09/2023     SUBJECTIVE: Shoulder pain has decreased to 4/10 at worst.  Will f/u with surgeon tomorrow.   Pain:  Are you having pain? Yes Pain location: R shoulder NPRS scale: 4/10 Aggravating factors: movement Relieving factors: rest, ice Pain description:  aching Stage: Acute Stability: getting better 24 hour pattern: worse with movement   OBJECTIVE: (objective measures completed at initial evaluation unless otherwise dated)   GENERAL OBSERVATION:          Not wearing sling "I forgot it in the car"                                SENSATION:          Light touch: Deficits R C6           PALPATION: Expected TTP R shoulder   UPPER EXTREMITY AROM:   ROM Right 01/06/2023 Left 01/06/2023 R A/PROM  03/30/23  Shoulder flexion 150   120/120  Shoulder abduction     60/90  Shoulder internal rotation     /80  Shoulder external rotation     /50  Functional IR       Functional ER       Shoulder extension       Elbow extension       Elbow flexion         (Blank rows = not tested, N = WNL, * = concordant pain with testing)   UPPER EXTREMITY MMT:   MMT Right 01/06/2023 Left 01/06/2023  Shoulder flexion      Shoulder abduction (C5)      Shoulder ER      Shoulder IR      Middle trapezius      Lower trapezius      Shoulder extension      Grip strength      Cervical flexion (C1,C2)      Cervical S/B (C3)      Shoulder shrug (C4)      Elbow flexion (C6)      Elbow ext (C7)      Thumb ext (C8)      Finger abd (T1)      Grossly        (Blank rows = not tested, score listed is out of 5 possible points.  N = WNL, D = diminished, C = clear for gross weakness with myotome testing, * = concordant pain with testing)     UPPER EXTREMITY PROM:   PROM Right 01/06/2023 Left 01/06/2023 R 01/23/23 R  02/02/23 R  04/02/23  Shoulder flexion 90+   120 135d 135  Shoulder abduction      80d P! 80P!  Shoulder internal rotation      70d   Shoulder external rotation 20+   30+ 40d P! 62d @90d  ABD  Functional IR         Functional ER         Shoulder extension         Elbow extension         Elbow flexion           (  Blank rows = not tested, N = WNL, * = concordant pain with testing)   PATIENT SURVEYS:  FOTO 24 -> 55; 04/02/23 44               TODAY'S TREATMENT:  Creating, reviewing, and completing below HEP     PATIENT EDUCATION:  POC, diagnosis, prognosis, HEP, and outcome measures.  Pt educated via explanation, demonstration, and handout (HEP).  Pt confirms understanding verbally.    HOME EXERCISE PROGRAM: Access Code: [email or web address] URL: [email or web address] Date: 03/05/2023 Prepared by: Alphonzo Severance  Exercises - Ice  - 5 x daily - 7 x weekly - 1 sets - 1 reps - 20 minutes hold - Shoulder Flexion Wall Slide with Towel  - 1 x daily - 7 x weekly - 3 sets - 10 reps - Standing Shoulder Row with Anchored Resistance  - 1 x daily - 7 x weekly - 3 sets - 10 reps - Shoulder extension with resistance - Neutral  - 1 x daily - 7 x weekly - 3 sets - 10 reps - Shoulder External Rotation with Anchored Resistance  - 1 x daily - 7 x weekly - 3 sets - 10 reps   Treatment priorities     Eval (01/06/2023)              Gentle PROM working to 120 d of flexion and 30 deg of ext rotation              precautions                                                            [facility] Adult PT Treatment:                                                DATE: 04/13/23 Therapeutic Exercise: Nustep L3 6 min Supine flexion 1000g ball 12x2 Supine press 1000g ball 12x2 S/L R abduction 500g ball 12x2 S/L R ER 1000g ball 12x2 Seated scaption 500g ball 15x2 Prone flexion 1000g ball 12x2 Prone extension 1000g ball 12x2 Prone row 1000g ball 12x2 Prone hor abduction 1000g ball 12x2 Supine rhythmic stabilization 90d flexion 60s x2 Hor abd YTB 15x2 supine Seated ER YTB 15x2   [facility] Adult PT Treatment:                                                DATE: 04/06/23 Therapeutic Exercise: Nustep L2 6 min Supine flexion 1000g ball 10x2 Supine press 1000g ball 10x2 S/L R abduction 1000g ball 10x, 500g ball 10x S/L R ER 1000g ball 10x2 Seated scaption 1000g ball 15x2 Prone flexion 1000g ball 10x2 Prone extension 1000g ball 10x2 Prone row  1000g ball 10x2 Prone hor abduction 1000g ball 10x2 Supine rhythmic stabilization 90d flexion 60s x2  [facility] Adult PT Treatment:  DATE: 04/02/23 Therapeutic Exercise: Nustep L2 6 min Supine flexion 500g ball 15x2 Supine press 500g ball 15x2 S/L R abduction 500g ball 15x2 S/L R ER 500g ball 15x2 Seated scaption 500g ball 15x2 Prone flexion 500g ball 15x2 Prone extension 500g ball 15x2 Prone row 500g ball 15x2 Prone hor abduction 500g ball 15x2 Supine rhythmic stabilization 90d flexion 60s x2  [facility] Adult PT Treatment:                                                DATE: 03/30/23 Therapeutic Exercise: OH pulleys 5 min Supine flexion 500g ball 15x Supine press 500g ball 15x S/L R abduction 500g ball 15x S/L R ER 500g ball 15x Seated scaption 500g ball 15x Manual Therapy: Inferior and posterior glides 5x10 to promote flexion and abduction Supine D1 FE PNF 15x       ASSESSMENT:   CLINICAL IMPRESSION: Reports less shoulder pain since last session, symptoms now 4/10.  Will see MD tomorrow, minimal changes to PT program made but increased reps as noted w/o increasing weight.  Still unable to abduct in S/L w/o subacromial pain.   Khamila has progressed fair with therapy.  Improved impairments include: shoulder ROM, strength, pain.  Functional improvements include: ability to use R shoulder for very light tasks.  Progressions needed include: progressive strengthening and functional ROM.  Barriers to progress include: pt has only attended 5 pt treatment sessions since surgery and has had poor compliance with precautions and HEP; discussed need for HEP compliance and regular PT to progress today; pt confirms understanding.  Please see GOALS section for progress on short term and long term goals established at evaluation.  I recommend continuation of PT to allow completion of remaining goals and continued functional progression.  OBJECTIVE  IMPAIRMENTS: Pain, R shoulder ROM, R shoulder strength   ACTIVITY LIMITATIONS: reaching, lifting, driving   PERSONAL FACTORS: See medical history and pertinent history     REHAB POTENTIAL: Good   CLINICAL DECISION MAKING: Stable/uncomplicated   EVALUATION COMPLEXITY: Low     GOALS:     SHORT TERM GOALS: Target date: 02/03/2023   Latishia will be >75% HEP compliant to improve carryover between sessions and facilitate independent management of condition   Evaluation (01/06/2023): ongoing Goal status: Ongoing due to limited post-op protocol     LONG TERM GOALS: Target date: 03/03/2023 (extended until 04/30/2023)   Latecia will improve FOTO score to 55 as a proxy for functional improvement   Evaluation/Baseline (01/06/2023): 24;  6/14: 47 04/02/23 44 Goal status: Ongoing     2.  Carleen will demonstrate >130 degrees of active ROM in flexion to allow completion of activities involving reaching OH, not limited by pain   Evaluation/Baseline (01/06/2023): 90 degrees PROM 6/14: 80 AROM 04/02/23 90d AROM Goal status: ongoing     3.  Ayala will achieve >=60 degrees of shoulder ER at 90 degrees of abduction to allow proper shoulder mechanics during OH movement, not limited by pain    Evaluation/Baseline (01/06/2023): 20 degrees PROM 6/14: 50 04/02/23 62d Goal status: Met     4.  Sakoya will be able to reach repeatedly into cabinet with >/= 3#, not limited by pain   Evaluation/Baseline (01/06/2023): limited 6/14: unable to reach into cabinet; 04/13/23 Unable to reach above shoulder height w/o pain Goal status: ongoing  PLAN: PT FREQUENCY: 1-2x/week   PT DURATION: 8 weeks (Ending 04/30/2023)   PLANNED INTERVENTIONS: Therapeutic exercises, Aquatic therapy, Therapeutic activity, Neuro Muscular re-education, Gait training, Patient/Family education, Joint mobilization, Dry Needling, Electrical stimulation, Spinal mobilization and/or manipulation, Moist heat, Taping, Vasopneumatic device,  Ionotophoresis 4mg /ml Dexamethasone, and Manual therapy   Hildred Laser PT 04/13/2023, 12:10 PM

## 2023-04-13 ENCOUNTER — Ambulatory Visit: Payer: Medicare Other

## 2023-04-13 DIAGNOSIS — G8929 Other chronic pain: Secondary | ICD-10-CM

## 2023-04-13 DIAGNOSIS — M25511 Pain in right shoulder: Secondary | ICD-10-CM | POA: Diagnosis not present

## 2023-04-13 DIAGNOSIS — M6281 Muscle weakness (generalized): Secondary | ICD-10-CM

## 2023-04-13 DIAGNOSIS — R600 Localized edema: Secondary | ICD-10-CM

## 2023-04-14 DIAGNOSIS — M25511 Pain in right shoulder: Secondary | ICD-10-CM | POA: Diagnosis not present

## 2023-04-16 ENCOUNTER — Ambulatory Visit: Payer: Medicare Other

## 2023-04-19 ENCOUNTER — Other Ambulatory Visit: Payer: Self-pay | Admitting: Internal Medicine

## 2023-04-19 DIAGNOSIS — R911 Solitary pulmonary nodule: Secondary | ICD-10-CM

## 2023-04-19 NOTE — Therapy (Signed)
PHYSICAL THERAPY TREATMENT NOTE/10th VISIT PROGRESS NOTE     Patient Name: Taylor Edwards MRN: [id number] DOB:1940/11/17, 82 y.o., female Today's Date: 04/19/2023  PCP: Emilio Aspen, MD   REFERRING PROVIDER: Jones Broom, MD          Past Medical History:  Diagnosis Date   Anemia    no current med.   Arthritis    knees   Diabetes mellitus    GERD (gastroesophageal reflux disease)    no current med.   Gout    Hypercholesteremia    Hypertension    has been on med. x 10 yrs.   Hypothyroid    IBS (irritable bowel syndrome)    Post-nasal drip    current cough   Sickle cell trait (HCC)    Urinary urgency    Past Surgical History:  Procedure Laterality Date   ABDOMINAL HYSTERECTOMY  1995   BUNIONECTOMY  08/25/2011   Procedure: Arbutus Leas;  Surgeon: Velna Ochs, MD;  Location: [facility];  Service: Orthopedics;  Laterality: Right;   BUNIONECTOMY Left 08/04/2022   Procedure: LEFT FOOT BUNIONECTOMY;  Surgeon: Marcene Corning, MD;  Location: [facility];  Service: Orthopedics;  Laterality: Left;   CATARACT EXTRACTION  2016   both eyes   COLONOSCOPY W/ BIOPSIES AND POLYPECTOMY     DILATION AND CURETTAGE OF UTERUS     INJECTION KNEE  08/25/2011   Procedure: KNEE INJECTION;  Surgeon: Velna Ochs, MD;  Location: [facility];  Service: Orthopedics;  Laterality: Bilateral;   KNEE ARTHROSCOPY  12/11/2008   bilat., with chondroplasty   KNEE JOINT MANIPULATION  10/30/2009   bilat.   OPEN REDUCTION INTERNAL FIXATION (ORIF) DISTAL RADIAL FRACTURE Left 11/17/2014   Procedure: OPEN REDUCTION INTERNAL FIXATION (ORIF) LEFT DISTAL RADIUS  FRACTURE;  Surgeon: Dairl Ponder, MD;  Location: [facility];  Service: Orthopedics;  Laterality: Left;   SHOULDER ARTHROSCOPY WITH ROTATOR CUFF REPAIR AND SUBACROMIAL DECOMPRESSION Right 12/15/2022   Procedure: SHOULDER ARTHROSCOPY WITH ROTATOR CUFF REPAIR AND SUBACROMIAL DECOMPRESSION;  Surgeon:  Jones Broom, MD;  Location: [facility];  Service: Orthopedics;  Laterality: Right;   TOTAL KNEE ARTHROPLASTY Bilateral 09/26/2009   bilat.   Patient Active Problem List   Diagnosis Date Noted   Posterior vitreous detachment, both eyes 03/26/2022   Diabetes mellitus without complication (HCC) 03/26/2022   Pseudophakia, both eyes 03/26/2022   History of total knee replacement, left 04/03/2019   History of total knee arthroplasty, right 04/03/2019   DERMATOFIBROMA 10/26/2007   ANEMIA, CHRONIC 10/26/2007   SICKLE CELL TRAIT 10/26/2007   ESSENTIAL HYPERTENSION 10/26/2007   GASTROESOPHAGEAL REFLUX DISEASE 10/26/2007   GASTRITIS 10/26/2007   HIATAL HERNIA 10/26/2007   CONSTIPATION, CHRONIC 10/26/2007   GUAIAC POSITIVE STOOL 10/26/2007    THERAPY DIAG:  No diagnosis found.   Rationale for Evaluation and Treatment: Rehabilitation  REFERRING DIAG: R R/C repair 3/26   PERTINENT HISTORY: Anemia, sickle cell trait, DM TII, bil knee replacements   PRECAUTIONS/RESTRICTIONS:   R R/C repair 3/26 supraspinatus with biceps release   OP Date: 12/15/2022  2 weeks 12/29/2022  4 weeks 01/12/2023  6 weeks 01/26/2023  8 weeks 02/09/2023  10 weeks 02/23/2023  12 weeks 03/09/2023     SUBJECTIVE: Shoulder pain has decreased to 4/10 at worst.  Will f/u with surgeon tomorrow.   Pain:  Are you having pain? Yes Pain location: R shoulder NPRS scale: 4/10 Aggravating factors: movement Relieving factors: rest, ice Pain description: aching Stage:  Acute Stability: getting better 24 hour pattern: worse with movement   OBJECTIVE: (objective measures completed at initial evaluation unless otherwise dated)   GENERAL OBSERVATION:          Not wearing sling "I forgot it in the car"                                SENSATION:          Light touch: Deficits R C6           PALPATION: Expected TTP R shoulder   UPPER EXTREMITY AROM:   ROM Right 01/06/2023 Left 01/06/2023 R A/PROM   03/30/23  Shoulder flexion 150   120/120  Shoulder abduction     60/90  Shoulder internal rotation     /80  Shoulder external rotation     /50  Functional IR       Functional ER       Shoulder extension       Elbow extension       Elbow flexion         (Blank rows = not tested, N = WNL, * = concordant pain with testing)   UPPER EXTREMITY MMT:   MMT Right 01/06/2023 Left 01/06/2023  Shoulder flexion      Shoulder abduction (C5)      Shoulder ER      Shoulder IR      Middle trapezius      Lower trapezius      Shoulder extension      Grip strength      Cervical flexion (C1,C2)      Cervical S/B (C3)      Shoulder shrug (C4)      Elbow flexion (C6)      Elbow ext (C7)      Thumb ext (C8)      Finger abd (T1)      Grossly        (Blank rows = not tested, score listed is out of 5 possible points.  N = WNL, D = diminished, C = clear for gross weakness with myotome testing, * = concordant pain with testing)     UPPER EXTREMITY PROM:   PROM Right 01/06/2023 Left 01/06/2023 R 01/23/23 R  02/02/23 R  04/02/23  Shoulder flexion 90+   120 135d 135  Shoulder abduction      80d P! 80P!  Shoulder internal rotation      70d   Shoulder external rotation 20+   30+ 40d P! 62d @90d  ABD  Functional IR         Functional ER         Shoulder extension         Elbow extension         Elbow flexion           (Blank rows = not tested, N = WNL, * = concordant pain with testing)   PATIENT SURVEYS:  FOTO 24 -> 55; 04/02/23 44              TODAY'S TREATMENT:  Creating, reviewing, and completing below HEP     PATIENT EDUCATION:  POC, diagnosis, prognosis, HEP, and outcome measures.  Pt educated via explanation, demonstration, and handout (HEP).  Pt confirms understanding verbally.    HOME EXERCISE PROGRAM: Access Code: [email or web address] URL: [email or web address] Date: 03/05/2023 Prepared by: Alphonzo Severance  Exercises - Ice  - 5 x  daily - 7 x weekly - 1 sets - 1 reps - 20  minutes hold - Shoulder Flexion Wall Slide with Towel  - 1 x daily - 7 x weekly - 3 sets - 10 reps - Standing Shoulder Row with Anchored Resistance  - 1 x daily - 7 x weekly - 3 sets - 10 reps - Shoulder extension with resistance - Neutral  - 1 x daily - 7 x weekly - 3 sets - 10 reps - Shoulder External Rotation with Anchored Resistance  - 1 x daily - 7 x weekly - 3 sets - 10 reps   Treatment priorities     Eval (01/06/2023)              Gentle PROM working to 120 d of flexion and 30 deg of ext rotation              precautions                                                            [facility] Adult PT Treatment:                                                DATE: 04/13/23 Therapeutic Exercise: Nustep L3 6 min Supine flexion 1000g ball 12x2 Supine press 1000g ball 12x2 S/L R abduction 500g ball 12x2 S/L R ER 1000g ball 12x2 Seated scaption 500g ball 15x2 Prone flexion 1000g ball 12x2 Prone extension 1000g ball 12x2 Prone row 1000g ball 12x2 Prone hor abduction 1000g ball 12x2 Supine rhythmic stabilization 90d flexion 60s x2 Hor abd YTB 15x2 supine Seated ER YTB 15x2   [facility] Adult PT Treatment:                                                DATE: 04/06/23 Therapeutic Exercise: Nustep L2 6 min Supine flexion 1000g ball 10x2 Supine press 1000g ball 10x2 S/L R abduction 1000g ball 10x, 500g ball 10x S/L R ER 1000g ball 10x2 Seated scaption 1000g ball 15x2 Prone flexion 1000g ball 10x2 Prone extension 1000g ball 10x2 Prone row 1000g ball 10x2 Prone hor abduction 1000g ball 10x2 Supine rhythmic stabilization 90d flexion 60s x2  [facility] Adult PT Treatment:                                                DATE: 04/02/23 Therapeutic Exercise: Nustep L2 6 min Supine flexion 500g ball 15x2 Supine press 500g ball 15x2 S/L R abduction 500g ball 15x2 S/L R ER 500g ball 15x2 Seated scaption 500g ball 15x2 Prone flexion 500g ball 15x2 Prone extension 500g ball 15x2 Prone row 500g ball  15x2 Prone hor abduction 500g ball 15x2 Supine rhythmic stabilization 90d flexion 60s x2  [facility] Adult PT Treatment:  DATE: 03/30/23 Therapeutic Exercise: OH pulleys 5 min Supine flexion 500g ball 15x Supine press 500g ball 15x S/L R abduction 500g ball 15x S/L R ER 500g ball 15x Seated scaption 500g ball 15x Manual Therapy: Inferior and posterior glides 5x10 to promote flexion and abduction Supine D1 FE PNF 15x       ASSESSMENT:   CLINICAL IMPRESSION: Reports less shoulder pain since last session, symptoms now 4/10.  Will see MD tomorrow, minimal changes to PT program made but increased reps as noted w/o increasing weight.  Still unable to abduct in S/L w/o subacromial pain.   Taylor Edwards has progressed fair with therapy.  Improved impairments include: shoulder ROM, strength, pain.  Functional improvements include: ability to use R shoulder for very light tasks.  Progressions needed include: progressive strengthening and functional ROM.  Barriers to progress include: pt has only attended 5 pt treatment sessions since surgery and has had poor compliance with precautions and HEP; discussed need for HEP compliance and regular PT to progress today; pt confirms understanding.  Please see GOALS section for progress on short term and long term goals established at evaluation.  I recommend continuation of PT to allow completion of remaining goals and continued functional progression.  OBJECTIVE IMPAIRMENTS: Pain, R shoulder ROM, R shoulder strength   ACTIVITY LIMITATIONS: reaching, lifting, driving   PERSONAL FACTORS: See medical history and pertinent history     REHAB POTENTIAL: Good   CLINICAL DECISION MAKING: Stable/uncomplicated   EVALUATION COMPLEXITY: Low     GOALS:     SHORT TERM GOALS: Target date: 02/03/2023   Flannery will be >75% HEP compliant to improve carryover between sessions and facilitate independent management of condition    Evaluation (01/06/2023): ongoing Goal status: Ongoing due to limited post-op protocol     LONG TERM GOALS: Target date: 03/03/2023 (extended until 04/30/2023)   Taylor Edwards will improve FOTO score to 55 as a proxy for functional improvement   Evaluation/Baseline (01/06/2023): 24;  6/14: 47 04/02/23 44 Goal status: Ongoing     2.  Taylor Edwards will demonstrate >130 degrees of active ROM in flexion to allow completion of activities involving reaching OH, not limited by pain   Evaluation/Baseline (01/06/2023): 90 degrees PROM 6/14: 80 AROM 04/02/23 90d AROM Goal status: ongoing     3.  Taylor Edwards will achieve >=60 degrees of shoulder ER at 90 degrees of abduction to allow proper shoulder mechanics during OH movement, not limited by pain    Evaluation/Baseline (01/06/2023): 20 degrees PROM 6/14: 50 04/02/23 62d Goal status: Met     4.  Taylor Edwards will be able to reach repeatedly into cabinet with >/= 3#, not limited by pain   Evaluation/Baseline (01/06/2023): limited 6/14: unable to reach into cabinet; 04/13/23 Unable to reach above shoulder height w/o pain Goal status: ongoing       PLAN: PT FREQUENCY: 1-2x/week   PT DURATION: 8 weeks (Ending 04/30/2023)   PLANNED INTERVENTIONS: Therapeutic exercises, Aquatic therapy, Therapeutic activity, Neuro Muscular re-education, Gait training, Patient/Family education, Joint mobilization, Dry Needling, Electrical stimulation, Spinal mobilization and/or manipulation, Moist heat, Taping, Vasopneumatic device, Ionotophoresis 4mg /ml Dexamethasone, and Manual therapy   Farris Has Taylor Edwards PT 04/19/2023, 9:00 AM

## 2023-04-20 ENCOUNTER — Ambulatory Visit: Payer: Medicare Other

## 2023-04-20 DIAGNOSIS — R600 Localized edema: Secondary | ICD-10-CM | POA: Diagnosis not present

## 2023-04-20 DIAGNOSIS — G8929 Other chronic pain: Secondary | ICD-10-CM | POA: Diagnosis not present

## 2023-04-20 DIAGNOSIS — M6281 Muscle weakness (generalized): Secondary | ICD-10-CM | POA: Diagnosis not present

## 2023-04-20 DIAGNOSIS — M25511 Pain in right shoulder: Secondary | ICD-10-CM | POA: Diagnosis not present

## 2023-04-20 NOTE — Therapy (Signed)
PHYSICAL THERAPY TREATMENT NOTE     Patient Name: Taylor Edwards MRN: [id number] DOB:Aug 05, 1941, 82 y.o., female Today's Date: 04/23/2023  PCP: Emilio Aspen, MD   REFERRING PROVIDER: Jones Broom, MD   PT End of Session - 04/23/23 1205     Visit Number 14    Date for PT Re-Evaluation 04/30/23    Authorization Type [employer] MCR - FOTO    Progress Note Due on Visit 10    PT Start Time 1205    PT Stop Time 1245    PT Time Calculation (min) 40 min    Activity Tolerance Patient tolerated treatment well    Behavior During Therapy WFL for tasks assessed/performed            Past Medical History:  Diagnosis Date   Anemia    no current med.   Arthritis    knees   Diabetes mellitus    GERD (gastroesophageal reflux disease)    no current med.   Gout    Hypercholesteremia    Hypertension    has been on med. x 10 yrs.   Hypothyroid    IBS (irritable bowel syndrome)    Post-nasal drip    current cough   Sickle cell trait (HCC)    Urinary urgency    Past Surgical History:  Procedure Laterality Date   ABDOMINAL HYSTERECTOMY  1995   BUNIONECTOMY  08/25/2011   Procedure: Arbutus Leas;  Surgeon: Velna Ochs, MD;  Location: [facility];  Service: Orthopedics;  Laterality: Right;   BUNIONECTOMY Left 08/04/2022   Procedure: LEFT FOOT BUNIONECTOMY;  Surgeon: Marcene Corning, MD;  Location: [facility];  Service: Orthopedics;  Laterality: Left;   CATARACT EXTRACTION  2016   both eyes   COLONOSCOPY W/ BIOPSIES AND POLYPECTOMY     DILATION AND CURETTAGE OF UTERUS     INJECTION KNEE  08/25/2011   Procedure: KNEE INJECTION;  Surgeon: Velna Ochs, MD;  Location: [facility];  Service: Orthopedics;  Laterality: Bilateral;   KNEE ARTHROSCOPY  12/11/2008   bilat., with chondroplasty   KNEE JOINT MANIPULATION  10/30/2009   bilat.   OPEN REDUCTION INTERNAL FIXATION (ORIF) DISTAL RADIAL FRACTURE Left 11/17/2014   Procedure: OPEN REDUCTION  INTERNAL FIXATION (ORIF) LEFT DISTAL RADIUS  FRACTURE;  Surgeon: Dairl Ponder, MD;  Location: [facility];  Service: Orthopedics;  Laterality: Left;   SHOULDER ARTHROSCOPY WITH ROTATOR CUFF REPAIR AND SUBACROMIAL DECOMPRESSION Right 12/15/2022   Procedure: SHOULDER ARTHROSCOPY WITH ROTATOR CUFF REPAIR AND SUBACROMIAL DECOMPRESSION;  Surgeon: Jones Broom, MD;  Location: [facility];  Service: Orthopedics;  Laterality: Right;   TOTAL KNEE ARTHROPLASTY Bilateral 09/26/2009   bilat.   Patient Active Problem List   Diagnosis Date Noted   Posterior vitreous detachment, both eyes 03/26/2022   Diabetes mellitus without complication (HCC) 03/26/2022   Pseudophakia, both eyes 03/26/2022   History of total knee replacement, left 04/03/2019   History of total knee arthroplasty, right 04/03/2019   DERMATOFIBROMA 10/26/2007   ANEMIA, CHRONIC 10/26/2007   SICKLE CELL TRAIT 10/26/2007   ESSENTIAL HYPERTENSION 10/26/2007   GASTROESOPHAGEAL REFLUX DISEASE 10/26/2007   GASTRITIS 10/26/2007   HIATAL HERNIA 10/26/2007   CONSTIPATION, CHRONIC 10/26/2007   GUAIAC POSITIVE STOOL 10/26/2007    THERAPY DIAG:  Muscle weakness  Chronic right shoulder pain   Rationale for Evaluation and Treatment: Rehabilitation  REFERRING DIAG: R R/C repair 3/26   PERTINENT HISTORY: Anemia, sickle cell trait, DM TII, bil knee replacements  PRECAUTIONS/RESTRICTIONS:   R R/C repair 3/26 supraspinatus with biceps release   OP Date: 12/15/2022  2 weeks 12/29/2022  4 weeks 01/12/2023  6 weeks 01/26/2023  8 weeks 02/09/2023  10 weeks 02/23/2023  12 weeks 03/09/2023     SUBJECTIVE: Resting pain 3/10, always in pain.  Notes increased ROM and less pain with reaching tasks.  Saw MD, no changes to meds, informed pain level was normal for 4-6 months post op.     Pain:  Are you having pain? Yes Pain location: R shoulder NPRS scale: 4/10 Aggravating factors: movement Relieving factors: rest, ice Pain  description: aching Stage: Acute Stability: getting better 24 hour pattern: worse with movement   OBJECTIVE: (objective measures completed at initial evaluation unless otherwise dated)   GENERAL OBSERVATION:          Not wearing sling "I forgot it in the car"                                SENSATION:          Light touch: Deficits R C6           PALPATION: Expected TTP R shoulder   UPPER EXTREMITY AROM:   ROM Right 01/06/2023 Left 01/06/2023 R A/PROM  03/30/23  Shoulder flexion 150   120/120  Shoulder abduction     60/90  Shoulder internal rotation     /80  Shoulder external rotation     /50  Functional IR       Functional ER       Shoulder extension       Elbow extension       Elbow flexion         (Blank rows = not tested, N = WNL, * = concordant pain with testing)   UPPER EXTREMITY MMT:   MMT Right 01/06/2023 Left 01/06/2023 R 04/20/23  Shoulder flexion     3+  Shoulder abduction (C5)     3  Shoulder ER     3+  Shoulder IR     3+  Middle trapezius     3  Lower trapezius     3  Shoulder extension     3+  Grip strength       Cervical flexion (C1,C2)       Cervical S/B (C3)       Shoulder shrug (C4)       Elbow flexion (C6)       Elbow ext (C7)       Thumb ext (C8)       Finger abd (T1)       Grossly         (Blank rows = not tested, score listed is out of 5 possible points.  N = WNL, D = diminished, C = clear for gross weakness with myotome testing, * = concordant pain with testing)     UPPER EXTREMITY PROM:   PROM Right 01/06/2023 Left 01/06/2023 R 01/23/23 R  02/02/23 R  04/02/23 R 04/20/23  Shoulder flexion 90+   120 135d 135 150d P!  Shoulder abduction      80d P! 80P! 90d P!  Shoulder internal rotation      70d  75d  Shoulder external rotation 20+   30+ 40d P! 62d @90d  ABD 70d @45d  abd  Functional IR          Functional ER  Shoulder extension          Elbow extension          Elbow flexion            (Blank rows = not tested, N = WNL, * =  concordant pain with testing)   PATIENT SURVEYS:  FOTO 24 -> 55; 04/02/23 44              TODAY'S TREATMENT:  Creating, reviewing, and completing below HEP     PATIENT EDUCATION:  POC, diagnosis, prognosis, HEP, and outcome measures.  Pt educated via explanation, demonstration, and handout (HEP).  Pt confirms understanding verbally.    HOME EXERCISE PROGRAM: Access Code: [email or web address] URL: [email or web address] Date: 03/05/2023 Prepared by: Alphonzo Severance  Exercises - Ice  - 5 x daily - 7 x weekly - 1 sets - 1 reps - 20 minutes hold - Shoulder Flexion Wall Slide with Towel  - 1 x daily - 7 x weekly - 3 sets - 10 reps - Standing Shoulder Row with Anchored Resistance  - 1 x daily - 7 x weekly - 3 sets - 10 reps - Shoulder extension with resistance - Neutral  - 1 x daily - 7 x weekly - 3 sets - 10 reps - Shoulder External Rotation with Anchored Resistance  - 1 x daily - 7 x weekly - 3 sets - 10 reps   Treatment priorities     Eval (01/06/2023)              Gentle PROM working to 120 d of flexion and 30 deg of ext rotation              precautions                                                            [facility] Adult PT Treatment:                                                DATE: 04/23/23 Therapeutic Exercise: Nustep L4 6 min Supine flexion 1000g ball 15x2 Supine press 1000g ball 15x2 Seated OH press 1000g ball 15x2 Supine hor abd YTB 15x2 Seated scaption 1000g ball 15x2 Hor abd YTB 15x Seated ER YTB 15x Manual Therapy: STM to R pec minor, teres major and infraspinatus to address supected trigger points   [facility] Adult PT Treatment:                                                DATE: 04/20/23 Therapeutic Exercise: Nustep L4 8 min Supine flexion 1000g ball 15x2 Supine press 1000g ball 15x2 Seated scaption 1000g ball 15x2 Hor abd RTB 15x2  Seated ER RTB 15x2 Manual Therapy: Inferior and posterior glides 5x10 to promote flexion and abduction PROM/stretch to  assess progress  [facility] Adult PT Treatment:  DATE: 04/13/23 Therapeutic Exercise: Nustep L3 6 min Supine flexion 1000g ball 12x2 Supine press 1000g ball 12x2 S/L R abduction 500g ball 12x2 S/L R ER 1000g ball 12x2 Seated scaption 500g ball 15x2 Prone flexion 1000g ball 12x2 Prone extension 1000g ball 12x2 Prone row 1000g ball 12x2 Prone hor abduction 1000g ball 12x2 Supine rhythmic stabilization 90d flexion 60s x2 Hor abd YTB 15x2 supine Seated ER YTB 15x2   [facility] Adult PT Treatment:                                                DATE: 04/06/23 Therapeutic Exercise: Nustep L2 6 min Supine flexion 1000g ball 10x2 Supine press 1000g ball 10x2 S/L R abduction 1000g ball 10x, 500g ball 10x S/L R ER 1000g ball 10x2 Seated scaption 1000g ball 15x2 Prone flexion 1000g ball 10x2 Prone extension 1000g ball 10x2 Prone row 1000g ball 10x2 Prone hor abduction 1000g ball 10x2 Supine rhythmic stabilization 90d flexion 60s x2  [facility] Adult PT Treatment:                                                DATE: 04/02/23 Therapeutic Exercise: Nustep L2 6 min Supine flexion 500g ball 15x2 Supine press 500g ball 15x2 S/L R abduction 500g ball 15x2 S/L R ER 500g ball 15x2 Seated scaption 500g ball 15x2 Prone flexion 500g ball 15x2 Prone extension 500g ball 15x2 Prone row 500g ball 15x2 Prone hor abduction 500g ball 15x2 Supine rhythmic stabilization 90d flexion 60s x2  [facility] Adult PT Treatment:                                                DATE: 03/30/23 Therapeutic Exercise: OH pulleys 5 min Supine flexion 500g ball 15x Supine press 500g ball 15x S/L R abduction 500g ball 15x S/L R ER 500g ball 15x Seated scaption 500g ball 15x Manual Therapy: Inferior and posterior glides 5x10 to promote flexion and abduction Supine D1 FE PNF 15x       ASSESSMENT:   CLINICAL IMPRESSION: Arrives to PT with elevated pain levels, no aggravating factors  reported.  Unable to tolerate seated ER and Hor abd w/o elevated pain as well as limited ROM rendering task ineffective,  Added STM as noted with some reduction of pain with AROM.  Symptoms remain localized to SA and subdeltoid region suggestive of potential inflammatory process.  Able to perform resisted [facility] press without pain.   Veatrice has progressed fair with therapy.  Improved impairments include: shoulder ROM, strength, pain.  Functional improvements include: ability to use R shoulder for very light tasks.  Progressions needed include: progressive strengthening and functional ROM.  Barriers to progress include: pt has only attended 5 pt treatment sessions since surgery and has had poor compliance with precautions and HEP; discussed need for HEP compliance and regular PT to progress today; pt confirms understanding.  Please see GOALS section for progress on short term and long term goals established at evaluation.  I recommend continuation of PT to allow completion of remaining goals and continued functional progression.  OBJECTIVE IMPAIRMENTS:  Pain, R shoulder ROM, R shoulder strength   ACTIVITY LIMITATIONS: reaching, lifting, driving   PERSONAL FACTORS: See medical history and pertinent history     REHAB POTENTIAL: Good   CLINICAL DECISION MAKING: Stable/uncomplicated   EVALUATION COMPLEXITY: Low     GOALS:     SHORT TERM GOALS: Target date: 02/03/2023   Jakylah will be >75% HEP compliant to improve carryover between sessions and facilitate independent management of condition   Evaluation (01/06/2023): ongoing Goal status: Ongoing due to limited post-op protocol     LONG TERM GOALS: Target date: 03/03/2023 (extended until 04/30/2023)   Nara will improve FOTO score to 55 as a proxy for functional improvement   Evaluation/Baseline (01/06/2023): 24;  6/14: 47 04/02/23 44 Goal status: Ongoing     2.  [place] will demonstrate >130 degrees of active ROM in flexion to allow completion of  activities involving reaching OH, not limited by pain   Evaluation/Baseline (01/06/2023): 90 degrees PROM 6/14: 80 AROM 04/02/23 90d AROM Goal status: ongoing     3.  Amaiya will achieve >=60 degrees of shoulder ER at 90 degrees of abduction to allow proper shoulder mechanics during OH movement, not limited by pain    Evaluation/Baseline (01/06/2023): 20 degrees PROM 6/14: 50 04/02/23 62d Goal status: Met     4.  Detrick will be able to reach repeatedly into cabinet with >/= 3#, not limited by pain   Evaluation/Baseline (01/06/2023): limited 6/14: unable to reach into cabinet; 04/13/23 Unable to reach above shoulder height w/o pain Goal status: ongoing       PLAN: PT FREQUENCY: 1-2x/week   PT DURATION: 8 weeks (Ending 04/30/2023)   PLANNED INTERVENTIONS: Therapeutic exercises, Aquatic therapy, Therapeutic activity, Neuro Muscular re-education, Gait training, Patient/Family education, Joint mobilization, Dry Needling, Electrical stimulation, Spinal mobilization and/or manipulation, Moist heat, Taping, Vasopneumatic device, Ionotophoresis 4mg /ml Dexamethasone, and Manual therapy   Hildred Laser PT 04/23/2023, 12:50 PM

## 2023-04-23 ENCOUNTER — Ambulatory Visit: Payer: Medicare Other | Attending: Internal Medicine

## 2023-04-23 DIAGNOSIS — M25511 Pain in right shoulder: Secondary | ICD-10-CM | POA: Diagnosis not present

## 2023-04-23 DIAGNOSIS — M6281 Muscle weakness (generalized): Secondary | ICD-10-CM | POA: Diagnosis not present

## 2023-04-23 DIAGNOSIS — R600 Localized edema: Secondary | ICD-10-CM | POA: Diagnosis not present

## 2023-04-23 DIAGNOSIS — G8929 Other chronic pain: Secondary | ICD-10-CM | POA: Insufficient documentation

## 2023-04-28 ENCOUNTER — Ambulatory Visit: Payer: Medicare Other | Admitting: Physical Therapy

## 2023-04-28 ENCOUNTER — Encounter: Payer: Self-pay | Admitting: Physical Therapy

## 2023-04-28 DIAGNOSIS — R600 Localized edema: Secondary | ICD-10-CM | POA: Diagnosis not present

## 2023-04-28 DIAGNOSIS — G8929 Other chronic pain: Secondary | ICD-10-CM

## 2023-04-28 DIAGNOSIS — M6281 Muscle weakness (generalized): Secondary | ICD-10-CM

## 2023-04-28 DIAGNOSIS — M25511 Pain in right shoulder: Secondary | ICD-10-CM | POA: Diagnosis not present

## 2023-04-28 NOTE — Therapy (Signed)
PHYSICAL THERAPY TREATMENT NOTE     Patient Name: Taylor Edwards MRN: [id number] DOB:1941-02-01, 82 y.o., female Today's Date: 04/28/2023  PCP: Emilio Aspen, MD   REFERRING PROVIDER: Jones Broom, MD   PT End of Session - 04/28/23 1045     Visit Number 15    Date for PT Re-Evaluation 04/30/23    Authorization Type [employer] MCR - FOTO    Progress Note Due on Visit 10    PT Start Time 1045    PT Stop Time 1126    PT Time Calculation (min) 41 min    Activity Tolerance Patient tolerated treatment well    Behavior During Therapy WFL for tasks assessed/performed            Past Medical History:  Diagnosis Date   Anemia    no current med.   Arthritis    knees   Diabetes mellitus    GERD (gastroesophageal reflux disease)    no current med.   Gout    Hypercholesteremia    Hypertension    has been on med. x 10 yrs.   Hypothyroid    IBS (irritable bowel syndrome)    Post-nasal drip    current cough   Sickle cell trait (HCC)    Urinary urgency    Past Surgical History:  Procedure Laterality Date   ABDOMINAL HYSTERECTOMY  1995   BUNIONECTOMY  08/25/2011   Procedure: Arbutus Leas;  Surgeon: Velna Ochs, MD;  Location: [facility];  Service: Orthopedics;  Laterality: Right;   BUNIONECTOMY Left 08/04/2022   Procedure: LEFT FOOT BUNIONECTOMY;  Surgeon: Marcene Corning, MD;  Location: [facility];  Service: Orthopedics;  Laterality: Left;   CATARACT EXTRACTION  2016   both eyes   COLONOSCOPY W/ BIOPSIES AND POLYPECTOMY     DILATION AND CURETTAGE OF UTERUS     INJECTION KNEE  08/25/2011   Procedure: KNEE INJECTION;  Surgeon: Velna Ochs, MD;  Location: [facility];  Service: Orthopedics;  Laterality: Bilateral;   KNEE ARTHROSCOPY  12/11/2008   bilat., with chondroplasty   KNEE JOINT MANIPULATION  10/30/2009   bilat.   OPEN REDUCTION INTERNAL FIXATION (ORIF) DISTAL RADIAL FRACTURE Left 11/17/2014   Procedure: OPEN REDUCTION  INTERNAL FIXATION (ORIF) LEFT DISTAL RADIUS  FRACTURE;  Surgeon: Dairl Ponder, MD;  Location: [facility];  Service: Orthopedics;  Laterality: Left;   SHOULDER ARTHROSCOPY WITH ROTATOR CUFF REPAIR AND SUBACROMIAL DECOMPRESSION Right 12/15/2022   Procedure: SHOULDER ARTHROSCOPY WITH ROTATOR CUFF REPAIR AND SUBACROMIAL DECOMPRESSION;  Surgeon: Jones Broom, MD;  Location:  S[facility]ERY [facility];  Service: Orthopedics;  Laterality: Right;   TOTAL KNEE ARTHROPLASTY Bilateral 09/26/2009   bilat.   Patient Active Problem List   Diagnosis Date Noted   Posterior vitreous detachment, both eyes 03/26/2022   Diabetes mellitus without complication (HCC) 03/26/2022   Pseudophakia, both eyes 03/26/2022   History of total knee replacement, left 04/03/2019   History of total knee arthroplasty, right 04/03/2019   DERMATOFIBROMA 10/26/2007   ANEMIA, CHRONIC 10/26/2007   SICKLE CELL TRAIT 10/26/2007   ESSENTIAL HYPERTENSION 10/26/2007   GASTROESOPHAGEAL REFLUX DISEASE 10/26/2007   GASTRITIS 10/26/2007   HIATAL HERNIA 10/26/2007   CONSTIPATION, CHRONIC 10/26/2007   GUAIAC POSITIVE STOOL 10/26/2007    THERAPY DIAG:  Muscle weakness  Chronic right shoulder pain  Muscle weakness (generalized)  Localized edema   Rationale for Evaluation and Treatment: Rehabilitation  REFERRING DIAG: R R/C repair 3/26   PERTINENT HISTORY: Anemia, sickle cell  trait, DM TII, bil knee replacements   PRECAUTIONS/RESTRICTIONS:   R R/C repair 3/26 supraspinatus with biceps release   OP Date: 12/15/2022  2 weeks 12/29/2022  4 weeks 01/12/2023  6 weeks 01/26/2023  8 weeks 02/09/2023  10 weeks 02/23/2023  12 weeks 03/09/2023     SUBJECTIVE: Pt reports that her shoulder is getting better.  She is able to reach and lift with less pain.   Pain:  Are you having pain? Yes Pain location: R shoulder NPRS scale: 4/10 Aggravating factors: movement Relieving factors: rest, ice Pain description: aching Stage:  Acute Stability: getting better 24 hour pattern: worse with movement   OBJECTIVE: (objective measures completed at initial evaluation unless otherwise dated)   GENERAL OBSERVATION:          Not wearing sling "I forgot it in the car"                                SENSATION:          Light touch: Deficits R C6           PALPATION: Expected TTP R shoulder   UPPER EXTREMITY AROM:   ROM Right 01/06/2023 Left 01/06/2023 R A/PROM  03/30/23  Shoulder flexion 150   120/120  Shoulder abduction     60/90  Shoulder internal rotation     /80  Shoulder external rotation     /50  Functional IR       Functional ER       Shoulder extension       Elbow extension       Elbow flexion         (Blank rows = not tested, N = WNL, * = concordant pain with testing)   UPPER EXTREMITY MMT:   MMT Right 01/06/2023 Left 01/06/2023 R 04/20/23  Shoulder flexion     3+  Shoulder abduction (C5)     3  Shoulder ER     3+  Shoulder IR     3+  Middle trapezius     3  Lower trapezius     3  Shoulder extension     3+  Grip strength       Cervical flexion (C1,C2)       Cervical S/B (C3)       Shoulder shrug (C4)       Elbow flexion (C6)       Elbow ext (C7)       Thumb ext (C8)       Finger abd (T1)       Grossly         (Blank rows = not tested, score listed is out of 5 possible points.  N = WNL, D = diminished, C = clear for gross weakness with myotome testing, * = concordant pain with testing)     UPPER EXTREMITY PROM:   PROM Right 01/06/2023 Left 01/06/2023 R 01/23/23 R  02/02/23 R  04/02/23 R 04/20/23  Shoulder flexion 90+   120 135d 135 150d P!  Shoulder abduction      80d P! 80P! 90d P!  Shoulder internal rotation      70d  75d  Shoulder external rotation 20+   30+ 40d P! 62d @90d  ABD 70d @45d  abd  Functional IR          Functional ER          Shoulder extension  Elbow extension          Elbow flexion            (Blank rows = not tested, N = WNL, * = concordant pain with  testing)   PATIENT SURVEYS:  FOTO 24 -> 55; 04/02/23 44              TODAY'S TREATMENT:  Creating, reviewing, and completing below HEP     PATIENT EDUCATION:  POC, diagnosis, prognosis, HEP, and outcome measures.  Pt educated via explanation, demonstration, and handout (HEP).  Pt confirms understanding verbally.    HOME EXERCISE PROGRAM: Access Code: [email or web address] URL: [email or web address] Date: 03/05/2023 Prepared by: Alphonzo Severance  Exercises - Ice  - 5 x daily - 7 x weekly - 1 sets - 1 reps - 20 minutes hold - Shoulder Flexion Wall Slide with Towel  - 1 x daily - 7 x weekly - 3 sets - 10 reps - Standing Shoulder Row with Anchored Resistance  - 1 x daily - 7 x weekly - 3 sets - 10 reps - Shoulder extension with resistance - Neutral  - 1 x daily - 7 x weekly - 3 sets - 10 reps - Shoulder External Rotation with Anchored Resistance  - 1 x daily - 7 x weekly - 3 sets - 10 reps   Treatment priorities     Eval (01/06/2023)              Gentle PROM working to 120 d of flexion and 30 deg of ext rotation              precautions                                                            [facility] Adult PT Treatment:                                                DATE: 04/28/23 Therapeutic Exercise: Nustep L4 6 min Supine flexion 1000g ball 15x2 Supine press 1000g ball 15x2 Seated OH press 1000g ball 15x2 Sidelying: ER -> Flexion -> Horizontal abd - 15x ea x2 Supine hor abd YTB 15x2 Seated scaption 1000g ball 15x2 Hor abd YTB 15x Manual Therapy: STM to R pec minor, teres major and infraspinatus to address supected trigger points   [facility] Adult PT Treatment:                                                DATE: 04/20/23 Therapeutic Exercise: Nustep L4 8 min Supine flexion 1000g ball 15x2 Supine press 1000g ball 15x2 Seated scaption 1000g ball 15x2 Hor abd RTB 15x2  Seated ER RTB 15x2 Manual Therapy: Inferior and posterior glides 5x10 to promote flexion and  abduction PROM/stretch to assess progress  [facility] Adult PT Treatment:  DATE: 04/13/23 Therapeutic Exercise: Nustep L3 6 min Supine flexion 1000g ball 12x2 Supine press 1000g ball 12x2 S/L R abduction 500g ball 12x2 S/L R ER 1000g ball 12x2 Seated scaption 500g ball 15x2 Prone flexion 1000g ball 12x2 Prone extension 1000g ball 12x2 Prone row 1000g ball 12x2 Prone hor abduction 1000g ball 12x2 Supine rhythmic stabilization 90d flexion 60s x2 Hor abd YTB 15x2 supine Seated ER YTB 15x2   [facility] Adult PT Treatment:                                                DATE: 04/06/23 Therapeutic Exercise: Nustep L2 6 min Supine flexion 1000g ball 10x2 Supine press 1000g ball 10x2 S/L R abduction 1000g ball 10x, 500g ball 10x S/L R ER 1000g ball 10x2 Seated scaption 1000g ball 15x2 Prone flexion 1000g ball 10x2 Prone extension 1000g ball 10x2 Prone row 1000g ball 10x2 Prone hor abduction 1000g ball 10x2 Supine rhythmic stabilization 90d flexion 60s x2  [facility] Adult PT Treatment:                                                DATE: 04/02/23 Therapeutic Exercise: Nustep L2 6 min Supine flexion 500g ball 15x2 Supine press 500g ball 15x2 S/L R abduction 500g ball 15x2 S/L R ER 500g ball 15x2 Seated scaption 500g ball 15x2 Prone flexion 500g ball 15x2 Prone extension 500g ball 15x2 Prone row 500g ball 15x2 Prone hor abduction 500g ball 15x2 Supine rhythmic stabilization 90d flexion 60s x2  [facility] Adult PT Treatment:                                                DATE: 03/30/23 Therapeutic Exercise: OH pulleys 5 min Supine flexion 500g ball 15x Supine press 500g ball 15x S/L R abduction 500g ball 15x S/L R ER 500g ball 15x Seated scaption 500g ball 15x Manual Therapy: Inferior and posterior glides 5x10 to promote flexion and abduction Supine D1 FE PNF 15x       ASSESSMENT:   CLINICAL IMPRESSION:   Pt arrives with lower levels of  baseline pain today.  We added in volume of R/C and periscapular exercises to good effect.  She does have some transient increase in pain with OH movements against gravity but this dissipates quickly with rest.  Encouraged work on HEP at home.  Pt responds well to manual therapy with reported pain reduction.    OBJECTIVE IMPAIRMENTS: Pain, R shoulder ROM, R shoulder strength   ACTIVITY LIMITATIONS: reaching, lifting, driving   PERSONAL FACTORS: See medical history and pertinent history     REHAB POTENTIAL: Good   CLINICAL DECISION MAKING: Stable/uncomplicated   EVALUATION COMPLEXITY: Low     GOALS:     SHORT TERM GOALS: Target date: 02/03/2023   Taylor Edwards will be >75% HEP compliant to improve carryover between sessions and facilitate independent management of condition   Evaluation (01/06/2023): ongoing Goal status: Ongoing due to limited post-op protocol     LONG TERM GOALS: Target date: 03/03/2023 (extended until 04/30/2023)   Taylor Edwards will improve FOTO score to 55  as a proxy for functional improvement   Evaluation/Baseline (01/06/2023): 24;  6/14: 47 04/02/23 44 Goal status: Ongoing     2.  Taylor Edwards will demonstrate >130 degrees of active ROM in flexion to allow completion of activities involving reaching OH, not limited by pain   Evaluation/Baseline (01/06/2023): 90 degrees PROM 6/14: 80 AROM 04/02/23 90d AROM Goal status: ongoing     3.  Taylor Edwards will achieve >=60 degrees of shoulder ER at 90 degrees of abduction to allow proper shoulder mechanics during OH movement, not limited by pain    Evaluation/Baseline (01/06/2023): 20 degrees PROM 6/14: 50 04/02/23 62d Goal status: Met     4.  Taylor Edwards will be able to reach repeatedly into cabinet with >/= 3#, not limited by pain   Evaluation/Baseline (01/06/2023): limited 6/14: unable to reach into cabinet; 04/13/23 Unable to reach above shoulder height w/o pain Goal status: ongoing       PLAN: PT FREQUENCY: 1-2x/week   PT DURATION:  8 weeks (Ending 04/30/2023)   PLANNED INTERVENTIONS: Therapeutic exercises, Aquatic therapy, Therapeutic activity, Neuro Muscular re-education, Gait training, Patient/Family education, Joint mobilization, Dry Needling, Electrical stimulation, Spinal mobilization and/or manipulation, Moist heat, Taping, Vasopneumatic device, Ionotophoresis 4mg /ml Dexamethasone, and Manual therapy   Kimberlee Nearing  PT 04/28/2023, 11:27 AM

## 2023-04-30 NOTE — Therapy (Unsigned)
PHYSICAL THERAPY TREATMENT NOTE/RECERTIFICATION     Patient Name: Taylor Edwards MRN: [id number] DOB:30-Jan-1941, 82 y.o., female Today's Date: 05/03/2023  PCP: Emilio Aspen, MD   REFERRING PROVIDER: Jones Broom, MD   PT End of Session - 05/03/23 1052     Visit Number 16    Date for PT Re-Evaluation 07/03/23    Authorization Type [employer] MCR - FOTO    PT Start Time 1045    PT Stop Time 1125    PT Time Calculation (min) 40 min             Past Medical History:  Diagnosis Date   Anemia    no current med.   Arthritis    knees   Diabetes mellitus    GERD (gastroesophageal reflux disease)    no current med.   Gout    Hypercholesteremia    Hypertension    has been on med. x 10 yrs.   Hypothyroid    IBS (irritable bowel syndrome)    Post-nasal drip    current cough   Sickle cell trait (HCC)    Urinary urgency    Past Surgical History:  Procedure Laterality Date   ABDOMINAL HYSTERECTOMY  1995   BUNIONECTOMY  08/25/2011   Procedure: Arbutus Leas;  Surgeon: Velna Ochs, MD;  Location: [facility];  Service: Orthopedics;  Laterality: Right;   BUNIONECTOMY Left 08/04/2022   Procedure: LEFT FOOT BUNIONECTOMY;  Surgeon: Marcene Corning, MD;  Location: [facility];  Service: Orthopedics;  Laterality: Left;   CATARACT EXTRACTION  2016   both eyes   COLONOSCOPY W/ BIOPSIES AND POLYPECTOMY     DILATION AND CURETTAGE OF UTERUS     INJECTION KNEE  08/25/2011   Procedure: KNEE INJECTION;  Surgeon: Velna Ochs, MD;  Location: [facility];  Service: Orthopedics;  Laterality: Bilateral;   KNEE ARTHROSCOPY  12/11/2008   bilat., with chondroplasty   KNEE JOINT MANIPULATION  10/30/2009   bilat.   OPEN REDUCTION INTERNAL FIXATION (ORIF) DISTAL RADIAL FRACTURE Left 11/17/2014   Procedure: OPEN REDUCTION INTERNAL FIXATION (ORIF) LEFT DISTAL RADIUS  FRACTURE;  Surgeon: Dairl Ponder, MD;  Location: [facility];  Service: Orthopedics;   Laterality: Left;   SHOULDER ARTHROSCOPY WITH ROTATOR CUFF REPAIR AND SUBACROMIAL DECOMPRESSION Right 12/15/2022   Procedure: SHOULDER ARTHROSCOPY WITH ROTATOR CUFF REPAIR AND SUBACROMIAL DECOMPRESSION;  Surgeon: Jones Broom, MD;  Location: [facility];  Service: Orthopedics;  Laterality: Right;   TOTAL KNEE ARTHROPLASTY Bilateral 09/26/2009   bilat.   Patient Active Problem List   Diagnosis Date Noted   Posterior vitreous detachment, both eyes 03/26/2022   Diabetes mellitus without complication (HCC) 03/26/2022   Pseudophakia, both eyes 03/26/2022   History of total knee replacement, left 04/03/2019   History of total knee arthroplasty, right 04/03/2019   DERMATOFIBROMA 10/26/2007   ANEMIA, CHRONIC 10/26/2007   SICKLE CELL TRAIT 10/26/2007   ESSENTIAL HYPERTENSION 10/26/2007   GASTROESOPHAGEAL REFLUX DISEASE 10/26/2007   GASTRITIS 10/26/2007   HIATAL HERNIA 10/26/2007   CONSTIPATION, CHRONIC 10/26/2007   GUAIAC POSITIVE STOOL 10/26/2007    THERAPY DIAG:  Muscle weakness - Plan: PT plan of care cert/re-cert  Muscle weakness (generalized) - Plan: PT plan of care cert/re-cert  Chronic right shoulder pain - Plan: PT plan of care cert/re-cert   Rationale for Evaluation and Treatment: Rehabilitation  REFERRING DIAG: R R/C repair 3/26   PERTINENT HISTORY: Anemia, sickle cell trait, DM TII, bil knee replacements   PRECAUTIONS/RESTRICTIONS:  R R/C repair 3/26 supraspinatus with biceps release   OP Date: 12/15/2022  2 weeks 12/29/2022  4 weeks 01/12/2023  6 weeks 01/26/2023  8 weeks 02/09/2023  10 weeks 02/23/2023  12 weeks 03/09/2023     SUBJECTIVE: Reports 2/10 resting pain, 5/10 pain with activity   Pain:  Are you having pain? Yes Pain location: R shoulder NPRS scale: 2-5/10 Aggravating factors: movement Relieving factors: rest, ice Pain description: aching Stage: Acute Stability: getting better 24 hour pattern: worse with movement   OBJECTIVE:  (objective measures completed at initial evaluation unless otherwise dated)   GENERAL OBSERVATION:          Not wearing sling "I forgot it in the car"                                SENSATION:          Light touch: Deficits R C6           PALPATION: Expected TTP R shoulder   UPPER EXTREMITY AROM:   ROM Right 01/06/2023 Left 01/06/2023 R A/PROM  03/30/23 R AROM 05/03/23  Shoulder flexion 150   120/120 120d  Shoulder abduction     60/90 110d  Shoulder internal rotation     /80 L5  Shoulder external rotation     /50 C7  Functional IR        Functional ER        Shoulder extension        Elbow extension        Elbow flexion          (Blank rows = not tested, N = WNL, * = concordant pain with testing)   UPPER EXTREMITY MMT:   MMT Right 01/06/2023 Left 01/06/2023 R 04/20/23 R 05/03/23  Shoulder flexion     3+ 3+ P!  Shoulder abduction (C5)     3 3+ P!  Shoulder ER     3+ 3+  Shoulder IR     3+ 3+  Middle trapezius     3 3+  Scaption     3 P!  Lower trapezius     3   Shoulder extension     3+ 4-  Grip strength        Cervical flexion (C1,C2)        Cervical S/B (C3)        Shoulder shrug (C4)        Elbow flexion (C6)        Elbow ext (C7)        Thumb ext (C8)        Finger abd (T1)        Grossly          (Blank rows = not tested, score listed is out of 5 possible points.  N = WNL, D = diminished, C = clear for gross weakness with myotome testing, * = concordant pain with testing)     UPPER EXTREMITY PROM:   PROM Right 01/06/2023 Left 01/06/2023 R 01/23/23 R  02/02/23 R  04/02/23 R 04/20/23 R 05/03/23  Shoulder flexion 90+   120 135d 135 150d P! 148d P!  Shoulder abduction      80d P! 80P! 90d P! 125d Scapular plane  Shoulder internal rotation      70d  75d 85d  Shoulder external rotation 20+   30+ 40d P! 62d @90d  ABD 70d @45d  abd  65d @45d  abd  Functional IR           Functional ER           Shoulder extension           Elbow extension           Elbow flexion              (Blank rows = not tested, N = WNL, * = concordant pain with testing)   PATIENT SURVEYS:  FOTO 24 -> 55; 04/02/23 44              TODAY'S TREATMENT:  Creating, reviewing, and completing below HEP     PATIENT EDUCATION:  POC, diagnosis, prognosis, HEP, and outcome measures.  Pt educated via explanation, demonstration, and handout (HEP).  Pt confirms understanding verbally.    HOME EXERCISE PROGRAM: Access Code: [email or web address] URL: [email or web address] Date: 03/05/2023 Prepared by: Alphonzo Severance  Exercises - Ice  - 5 x daily - 7 x weekly - 1 sets - 1 reps - 20 minutes hold - Shoulder Flexion Wall Slide with Towel  - 1 x daily - 7 x weekly - 3 sets - 10 reps - Standing Shoulder Row with Anchored Resistance  - 1 x daily - 7 x weekly - 3 sets - 10 reps - Shoulder extension with resistance - Neutral  - 1 x daily - 7 x weekly - 3 sets - 10 reps - Shoulder External Rotation with Anchored Resistance  - 1 x daily - 7 x weekly - 3 sets - 10 reps   Treatment priorities     Eval (01/06/2023)              Gentle PROM working to 120 d of flexion and 30 deg of ext rotation              precautions                                                            [facility] Adult PT Treatment:                                                DATE: 05/03/23 Therapeutic Exercise: OH pulley 5 min Supine flexion 1000g ball 15x2 Supine press 1000g ball 15x2 S/L ER 1000g ball 15x2 S/L abd 1000g ball 15x2 Re-assessment for recertification  [facility] Adult PT Treatment:                                                DATE: 04/28/23 Therapeutic Exercise: Nustep L4 6 min Supine flexion 1000g ball 15x2 Supine press 1000g ball 15x2 Seated OH press 1000g ball 15x2 Sidelying: ER -> Flexion -> Horizontal abd - 15x ea x2 Supine hor abd YTB 15x2 Seated scaption 1000g ball 15x2 Hor abd YTB 15x Manual Therapy: STM to R pec minor, teres major and infraspinatus to address supected trigger points    [facility] Adult PT Treatment:  DATE: 04/20/23 Therapeutic Exercise: Nustep L4 8 min Supine flexion 1000g ball 15x2 Supine press 1000g ball 15x2 Seated scaption 1000g ball 15x2 Hor abd RTB 15x2  Seated ER RTB 15x2 Manual Therapy: Inferior and posterior glides 5x10 to promote flexion and abduction PROM/stretch to assess progress   ASSESSMENT:   CLINICAL IMPRESSION: Overall pain levels have declined but A/PROM as well as strength remain limited by sharp subacromial pain levels.  AROM has increased slightly.  Strength levels remain below functional level due to pain. RC musculature appears intact.  Patient presents with S&S of subacromial inflammation.  Re-certification sent today.   OBJECTIVE IMPAIRMENTS: Pain, R shoulder ROM, R shoulder strength   ACTIVITY LIMITATIONS: reaching, lifting, driving   PERSONAL FACTORS: See medical history and pertinent history     REHAB POTENTIAL: Good   CLINICAL DECISION MAKING: Stable/uncomplicated   EVALUATION COMPLEXITY: Low     GOALS:     SHORT TERM GOALS: Target date: 02/03/2023   Otisha will be >75% HEP compliant to improve carryover between sessions and facilitate independent management of condition   Evaluation (01/06/2023): ongoing Goal status: Ongoing due to limited post-op protocol     LONG TERM GOALS: Target date: 03/03/2023 (extended until 04/30/2023)   Margory will improve FOTO score to 55 as a proxy for functional improvement   Evaluation/Baseline (01/06/2023): 24;  6/14: 47 04/02/23 4 Goal status: Ongoing     2.  Flynn will demonstrate >130 degrees of active ROM in flexion to allow completion of activities involving reaching OH, not limited by pain   Evaluation/Baseline (01/06/2023): 90 degrees PROM 6/14: 80 AROM 04/02/23 90d AROM R AROM 05/03/23  120d  110d  L5  C7   Goal status: ongoing     3.  Krishawna will achieve >=60 degrees of shoulder ER at 90 degrees of abduction to  allow proper shoulder mechanics during OH movement, not limited by pain    Evaluation/Baseline (01/06/2023): 20 degrees PROM 6/14: 50 04/02/23 62d R 05/03/23  148d P!  125d Scapular plane  85d  65d @45d  abd   Goal status: Met     4.  Larkyn will be able to reach repeatedly into cabinet with >/= 3#, not limited by pain   Evaluation/Baseline (01/06/2023): limited 6/14: unable to reach into cabinet; 04/13/23 Unable to reach above shoulder height w/o pain 05/03/23 Unable to reach above shoulder height w/o pain Goal status: ongoing       PLAN: PT FREQUENCY: 1-2x/week   PT DURATION: 8 weeks (Ending 07/03/2023)   PLANNED INTERVENTIONS: Therapeutic exercises, Aquatic therapy, Therapeutic activity, Neuro Muscular re-education, Gait training, Patient/Family education, Joint mobilization, Dry Needling, Electrical stimulation, Spinal mobilization and/or manipulation, Moist heat, Taping, Vasopneumatic device, Ionotophoresis 4mg /ml Dexamethasone, and Manual therapy   Hildred Laser PT 05/03/2023, 11:34 AM

## 2023-05-03 ENCOUNTER — Ambulatory Visit: Payer: Medicare Other

## 2023-05-03 DIAGNOSIS — G8929 Other chronic pain: Secondary | ICD-10-CM

## 2023-05-03 DIAGNOSIS — M6281 Muscle weakness (generalized): Secondary | ICD-10-CM | POA: Diagnosis not present

## 2023-05-03 DIAGNOSIS — M25511 Pain in right shoulder: Secondary | ICD-10-CM | POA: Diagnosis not present

## 2023-05-03 DIAGNOSIS — R600 Localized edema: Secondary | ICD-10-CM | POA: Diagnosis not present

## 2023-05-07 ENCOUNTER — Ambulatory Visit
Admission: RE | Admit: 2023-05-07 | Discharge: 2023-05-07 | Disposition: A | Discharge status: Home / Self Care | Payer: Medicare Other | Source: Ambulatory Visit | Attending: Internal Medicine | Admitting: Internal Medicine

## 2023-05-07 DIAGNOSIS — R911 Solitary pulmonary nodule: Secondary | ICD-10-CM

## 2023-05-07 DIAGNOSIS — R918 Other nonspecific abnormal finding of lung field: Secondary | ICD-10-CM | POA: Diagnosis not present

## 2023-05-07 DIAGNOSIS — I700 Atherosclerosis of aorta: Secondary | ICD-10-CM | POA: Diagnosis not present

## 2023-05-07 DIAGNOSIS — K8020 Calculus of gallbladder without cholecystitis without obstruction: Secondary | ICD-10-CM | POA: Diagnosis not present

## 2023-05-10 NOTE — Therapy (Deleted)
PHYSICAL THERAPY TREATMENT NOTE/RECERTIFICATION     Patient Name: Taylor Edwards MRN: [id number] DOB:06-27-1941, 82 y.o., female Today's Date: 05/10/2023  PCP: Emilio Aspen, MD   REFERRING PROVIDER: Jones Broom, MD     Past Medical History:  Diagnosis Date   Anemia    no current med.   Arthritis    knees   Diabetes mellitus    GERD (gastroesophageal reflux disease)    no current med.   Gout    Hypercholesteremia    Hypertension    has been on med. x 10 yrs.   Hypothyroid    IBS (irritable bowel syndrome)    Post-nasal drip    current cough   Sickle cell trait (HCC)    Urinary urgency    Past Surgical History:  Procedure Laterality Date   ABDOMINAL HYSTERECTOMY  1995   BUNIONECTOMY  08/25/2011   Procedure: Arbutus Leas;  Surgeon: Velna Ochs, MD;  Location: [facility];  Service: Orthopedics;  Laterality: Right;   BUNIONECTOMY Left 08/04/2022   Procedure: LEFT FOOT BUNIONECTOMY;  Surgeon: Marcene Corning, MD;  Location: [facility];  Service: Orthopedics;  Laterality: Left;   CATARACT EXTRACTION  2016   both eyes   COLONOSCOPY W/ BIOPSIES AND POLYPECTOMY     DILATION AND CURETTAGE OF UTERUS     INJECTION KNEE  08/25/2011   Procedure: KNEE INJECTION;  Surgeon: Velna Ochs, MD;  Location: [facility];  Service: Orthopedics;  Laterality: Bilateral;   KNEE ARTHROSCOPY  12/11/2008   bilat., with chondroplasty   KNEE JOINT MANIPULATION  10/30/2009   bilat.   OPEN REDUCTION INTERNAL FIXATION (ORIF) DISTAL RADIAL FRACTURE Left 11/17/2014   Procedure: OPEN REDUCTION INTERNAL FIXATION (ORIF) LEFT DISTAL RADIUS  FRACTURE;  Surgeon: Dairl Ponder, MD;  Location: [facility];  Service: Orthopedics;  Laterality: Left;   SHOULDER ARTHROSCOPY WITH ROTATOR CUFF REPAIR AND SUBACROMIAL DECOMPRESSION Right 12/15/2022   Procedure: SHOULDER ARTHROSCOPY WITH ROTATOR CUFF REPAIR AND SUBACROMIAL DECOMPRESSION;  Surgeon: Jones Broom, MD;   Location: [facility];  Service: Orthopedics;  Laterality: Right;   TOTAL KNEE ARTHROPLASTY Bilateral 09/26/2009   bilat.   Patient Active Problem List   Diagnosis Date Noted   Posterior vitreous detachment, both eyes 03/26/2022   Diabetes mellitus without complication (HCC) 03/26/2022   Pseudophakia, both eyes 03/26/2022   History of total knee replacement, left 04/03/2019   History of total knee arthroplasty, right 04/03/2019   DERMATOFIBROMA 10/26/2007   ANEMIA, CHRONIC 10/26/2007   SICKLE CELL TRAIT 10/26/2007   ESSENTIAL HYPERTENSION 10/26/2007   GASTROESOPHAGEAL REFLUX DISEASE 10/26/2007   GASTRITIS 10/26/2007   HIATAL HERNIA 10/26/2007   CONSTIPATION, CHRONIC 10/26/2007   GUAIAC POSITIVE STOOL 10/26/2007    THERAPY DIAG:  No diagnosis found.   Rationale for Evaluation and Treatment: Rehabilitation  REFERRING DIAG: R R/C repair 3/26   PERTINENT HISTORY: Anemia, sickle cell trait, DM TII, bil knee replacements   PRECAUTIONS/RESTRICTIONS:   R R/C repair 3/26 supraspinatus with biceps release   OP Date: 12/15/2022  2 weeks 12/29/2022  4 weeks 01/12/2023  6 weeks 01/26/2023  8 weeks 02/09/2023  10 weeks 02/23/2023  12 weeks 03/09/2023     SUBJECTIVE: Reports 2/10 resting pain, 5/10 pain with activity   Pain:  Are you having pain? Yes Pain location: R shoulder NPRS scale: 2-5/10 Aggravating factors: movement Relieving factors: rest, ice Pain description: aching Stage: Acute Stability: getting better 24 hour pattern: worse with movement   OBJECTIVE: (objective  measures completed at initial evaluation unless otherwise dated)   GENERAL OBSERVATION:          Not wearing sling "I forgot it in the car"                                SENSATION:          Light touch: Deficits R C6           PALPATION: Expected TTP R shoulder   UPPER EXTREMITY AROM:   ROM Right 01/06/2023 Left 01/06/2023 R A/PROM  03/30/23 R AROM 05/03/23  Shoulder flexion 150    120/120 120d  Shoulder abduction     60/90 110d  Shoulder internal rotation     /80 L5  Shoulder external rotation     /50 C7  Functional IR        Functional ER        Shoulder extension        Elbow extension        Elbow flexion          (Blank rows = not tested, N = WNL, * = concordant pain with testing)   UPPER EXTREMITY MMT:   MMT Right 01/06/2023 Left 01/06/2023 R 04/20/23 R 05/03/23  Shoulder flexion     3+ 3+ P!  Shoulder abduction (C5)     3 3+ P!  Shoulder ER     3+ 3+  Shoulder IR     3+ 3+  Middle trapezius     3 3+  Scaption     3 P!  Lower trapezius     3   Shoulder extension     3+ 4-  Grip strength        Cervical flexion (C1,C2)        Cervical S/B (C3)        Shoulder shrug (C4)        Elbow flexion (C6)        Elbow ext (C7)        Thumb ext (C8)        Finger abd (T1)        Grossly          (Blank rows = not tested, score listed is out of 5 possible points.  N = WNL, D = diminished, C = clear for gross weakness with myotome testing, * = concordant pain with testing)     UPPER EXTREMITY PROM:   PROM Right 01/06/2023 Left 01/06/2023 R 01/23/23 R  02/02/23 R  04/02/23 R 04/20/23 R 05/03/23  Shoulder flexion 90+   120 135d 135 150d P! 148d P!  Shoulder abduction      80d P! 80P! 90d P! 125d Scapular plane  Shoulder internal rotation      70d  75d 85d  Shoulder external rotation 20+   30+ 40d P! 62d @90d  ABD 70d @45d  abd 65d @45d  abd  Functional IR           Functional ER           Shoulder extension           Elbow extension           Elbow flexion             (Blank rows = not tested, N = WNL, * = concordant pain with testing)   PATIENT SURVEYS:  FOTO 24 -> 55; 04/02/23 44  TODAY'S TREATMENT:  Creating, reviewing, and completing below HEP     PATIENT EDUCATION:  POC, diagnosis, prognosis, HEP, and outcome measures.  Pt educated via explanation, demonstration, and handout (HEP).  Pt confirms understanding verbally.    HOME  EXERCISE PROGRAM: Access Code: [email or web address] URL: [email or web address] Date: 03/05/2023 Prepared by: Alphonzo Severance  Exercises - Ice  - 5 x daily - 7 x weekly - 1 sets - 1 reps - 20 minutes hold - Shoulder Flexion Wall Slide with Towel  - 1 x daily - 7 x weekly - 3 sets - 10 reps - Standing Shoulder Row with Anchored Resistance  - 1 x daily - 7 x weekly - 3 sets - 10 reps - Shoulder extension with resistance - Neutral  - 1 x daily - 7 x weekly - 3 sets - 10 reps - Shoulder External Rotation with Anchored Resistance  - 1 x daily - 7 x weekly - 3 sets - 10 reps   Treatment priorities     Eval (01/06/2023)              Gentle PROM working to 120 d of flexion and 30 deg of ext rotation              precautions                                                            [facility] Adult PT Treatment:                                                DATE: 05/03/23 Therapeutic Exercise: OH pulley 5 min Supine flexion 1000g ball 15x2 Supine press 1000g ball 15x2 S/L ER 1000g ball 15x2 S/L abd 1000g ball 15x2 Re-assessment for recertification  [facility] Adult PT Treatment:                                                DATE: 04/28/23 Therapeutic Exercise: Nustep L4 6 min Supine flexion 1000g ball 15x2 Supine press 1000g ball 15x2 Seated OH press 1000g ball 15x2 Sidelying: ER -> Flexion -> Horizontal abd - 15x ea x2 Supine hor abd YTB 15x2 Seated scaption 1000g ball 15x2 Hor abd YTB 15x Manual Therapy: STM to R pec minor, teres major and infraspinatus to address supected trigger points   [facility] Adult PT Treatment:                                                DATE: 04/20/23 Therapeutic Exercise: Nustep L4 8 min Supine flexion 1000g ball 15x2 Supine press 1000g ball 15x2 Seated scaption 1000g ball 15x2 Hor abd RTB 15x2  Seated ER RTB 15x2 Manual Therapy: Inferior and posterior glides 5x10 to promote flexion and abduction PROM/stretch to assess progress   ASSESSMENT:    CLINICAL IMPRESSION: Overall pain levels have declined but A/PROM as well as strength remain limited by sharp subacromial  pain levels.  AROM has increased slightly.  Strength levels remain below functional level due to pain. RC musculature appears intact.  Patient presents with S&S of subacromial inflammation.  Re-certification sent today.   OBJECTIVE IMPAIRMENTS: Pain, R shoulder ROM, R shoulder strength   ACTIVITY LIMITATIONS: reaching, lifting, driving   PERSONAL FACTORS: See medical history and pertinent history     REHAB POTENTIAL: Good   CLINICAL DECISION MAKING: Stable/uncomplicated   EVALUATION COMPLEXITY: Low     GOALS:     SHORT TERM GOALS: Target date: 02/03/2023   Lavon will be >75% HEP compliant to improve carryover between sessions and facilitate independent management of condition   Evaluation (01/06/2023): ongoing Goal status: Ongoing due to limited post-op protocol     LONG TERM GOALS: Target date: 03/03/2023 (extended until 04/30/2023)   Ileta will improve FOTO score to 55 as a proxy for functional improvement   Evaluation/Baseline (01/06/2023): 24;  6/14: 47 04/02/23 4 Goal status: Ongoing     2.  Hildagarde will demonstrate >130 degrees of active ROM in flexion to allow completion of activities involving reaching OH, not limited by pain   Evaluation/Baseline (01/06/2023): 90 degrees PROM 6/14: 80 AROM 04/02/23 90d AROM R AROM 05/03/23  120d  110d  L5  C7   Goal status: ongoing     3.  Suzana will achieve >=60 degrees of shoulder ER at 90 degrees of abduction to allow proper shoulder mechanics during OH movement, not limited by pain    Evaluation/Baseline (01/06/2023): 20 degrees PROM 6/14: 50 04/02/23 62d R 05/03/23  148d P!  125d Scapular plane  85d  65d @45d  abd   Goal status: Met     4.  Leyani will be able to reach repeatedly into cabinet with >/= 3#, not limited by pain   Evaluation/Baseline (01/06/2023): limited 6/14: unable to reach into  cabinet; 04/13/23 Unable to reach above shoulder height w/o pain 05/03/23 Unable to reach above shoulder height w/o pain Goal status: ongoing       PLAN: PT FREQUENCY: 1-2x/week   PT DURATION: 8 weeks (Ending 07/03/2023)   PLANNED INTERVENTIONS: Therapeutic exercises, Aquatic therapy, Therapeutic activity, Neuro Muscular re-education, Gait training, Patient/Family education, Joint mobilization, Dry Needling, Electrical stimulation, Spinal mobilization and/or manipulation, Moist heat, Taping, Vasopneumatic device, Ionotophoresis 4mg /ml Dexamethasone, and Manual therapy   Hildred Laser PT 05/10/2023, 9:44 AM

## 2023-05-12 ENCOUNTER — Telehealth: Payer: Self-pay

## 2023-05-12 ENCOUNTER — Ambulatory Visit: Payer: Medicare Other

## 2023-05-12 NOTE — Telephone Encounter (Signed)
TC due to missed visit.  Spoke directly to patient who did not get an appointment reminder by phone.  Confirmed with front office system did not call patients the day prior to their appointments.

## 2023-05-13 DIAGNOSIS — R350 Frequency of micturition: Secondary | ICD-10-CM | POA: Diagnosis not present

## 2023-05-31 NOTE — Therapy (Unsigned)
PHYSICAL THERAPY TREATMENT NOTE/RECERTIFICATION     Patient Name: Taylor Edwards MRN: [id number] DOB:[birth year], 82 y.o., female Today's Date: 05/31/2023  PCP: Emilio Aspen, MD   REFERRING PROVIDER: Jones Broom, MD     Past Medical History:  Diagnosis Date   Anemia    no current med.   Arthritis    knees   Diabetes mellitus    GERD (gastroesophageal reflux disease)    no current med.   Gout    Hypercholesteremia    Hypertension    has been on med. x 10 yrs.   Hypothyroid    IBS (irritable bowel syndrome)    Post-nasal drip    current cough   Sickle cell trait (HCC)    Urinary urgency    Past Surgical History:  Procedure Laterality Date   ABDOMINAL HYSTERECTOMY  1995   BUNIONECTOMY  08/25/2011   Procedure: Arbutus Leas;  Surgeon: Velna Ochs, MD;  Location: [facility];  Service: Orthopedics;  Laterality: Right;   BUNIONECTOMY Left 08/04/2022   Procedure: LEFT FOOT BUNIONECTOMY;  Surgeon: Marcene Corning, MD;  Location: [facility];  Service: Orthopedics;  Laterality: Left;   CATARACT EXTRACTION  2016   both eyes   COLONOSCOPY W/ BIOPSIES AND POLYPECTOMY     DILATION AND CURETTAGE OF UTERUS     INJECTION KNEE  08/25/2011   Procedure: KNEE INJECTION;  Surgeon: Velna Ochs, MD;  Location: [facility];  Service: Orthopedics;  Laterality: Bilateral;   KNEE ARTHROSCOPY  12/11/2008   bilat., with chondroplasty   KNEE JOINT MANIPULATION  10/30/2009   bilat.   OPEN REDUCTION INTERNAL FIXATION (ORIF) DISTAL RADIAL FRACTURE Left 11/17/2014   Procedure: OPEN REDUCTION INTERNAL FIXATION (ORIF) LEFT DISTAL RADIUS  FRACTURE;  Surgeon: Dairl Ponder, MD;  Location: [facility];  Service: Orthopedics;  Laterality: Left;   SHOULDER ARTHROSCOPY WITH ROTATOR CUFF REPAIR AND SUBACROMIAL DECOMPRESSION Right 12/15/2022   Procedure: SHOULDER ARTHROSCOPY WITH ROTATOR CUFF REPAIR AND SUBACROMIAL DECOMPRESSION;  Surgeon: Jones Broom, MD;   Location: [facility];  Service: Orthopedics;  Laterality: Right;   TOTAL KNEE ARTHROPLASTY Bilateral 09/26/2009   bilat.   Patient Active Problem List   Diagnosis Date Noted   Posterior vitreous detachment, both eyes 03/26/2022   Diabetes mellitus without complication (HCC) 03/26/2022   Pseudophakia, both eyes 03/26/2022   History of total knee replacement, left 04/03/2019   History of total knee arthroplasty, right 04/03/2019   DERMATOFIBROMA 10/26/2007   ANEMIA, CHRONIC 10/26/2007   SICKLE CELL TRAIT 10/26/2007   ESSENTIAL HYPERTENSION 10/26/2007   GASTROESOPHAGEAL REFLUX DISEASE 10/26/2007   GASTRITIS 10/26/2007   HIATAL HERNIA 10/26/2007   CONSTIPATION, CHRONIC 10/26/2007   GUAIAC POSITIVE STOOL 10/26/2007    THERAPY DIAG:  No diagnosis found.   Rationale for Evaluation and Treatment: Rehabilitation  REFERRING DIAG: R R/C repair 3/26   PERTINENT HISTORY: Anemia, sickle cell trait, DM TII, bil knee replacements   PRECAUTIONS/RESTRICTIONS:   R R/C repair 3/26 supraspinatus with biceps release   OP Date: 12/15/2022  2 weeks 12/29/2022  4 weeks 01/12/2023  6 weeks 01/26/2023  8 weeks 02/09/2023  10 weeks 02/23/2023  12 weeks 03/09/2023     SUBJECTIVE: Reports 2/10 resting pain, 5/10 pain with activity   Pain:  Are you having pain? Yes Pain location: R shoulder NPRS scale: 2-5/10 Aggravating factors: movement Relieving factors: rest, ice Pain description: aching Stage: Acute Stability: getting better 24 hour pattern: worse with movement   OBJECTIVE: (objective  measures completed at initial evaluation unless otherwise dated)   GENERAL OBSERVATION:          Not wearing sling "I forgot it in the car"                                SENSATION:          Light touch: Deficits R C6           PALPATION: Expected TTP R shoulder   UPPER EXTREMITY AROM:   ROM Right 01/06/2023 Left 01/06/2023 R A/PROM  03/30/23 R AROM 05/03/23  Shoulder flexion 150    120/120 120d  Shoulder abduction     60/90 110d  Shoulder internal rotation     /80 L5  Shoulder external rotation     /50 C7  Functional IR        Functional ER        Shoulder extension        Elbow extension        Elbow flexion          (Blank rows = not tested, N = WNL, * = concordant pain with testing)   UPPER EXTREMITY MMT:   MMT Right 01/06/2023 Left 01/06/2023 R 04/20/23 R 05/03/23  Shoulder flexion     3+ 3+ P!  Shoulder abduction (C5)     3 3+ P!  Shoulder ER     3+ 3+  Shoulder IR     3+ 3+  Middle trapezius     3 3+  Scaption     3 P!  Lower trapezius     3   Shoulder extension     3+ 4-  Grip strength        Cervical flexion (C1,C2)        Cervical S/B (C3)        Shoulder shrug (C4)        Elbow flexion (C6)        Elbow ext (C7)        Thumb ext (C8)        Finger abd (T1)        Grossly          (Blank rows = not tested, score listed is out of 5 possible points.  N = WNL, D = diminished, C = clear for gross weakness with myotome testing, * = concordant pain with testing)     UPPER EXTREMITY PROM:   PROM Right 01/06/2023 Left 01/06/2023 R 01/23/23 R  02/02/23 R  04/02/23 R 04/20/23 R 05/03/23  Shoulder flexion 90+   120 135d 135 150d P! 148d P!  Shoulder abduction      80d P! 80P! 90d P! 125d Scapular plane  Shoulder internal rotation      70d  75d 85d  Shoulder external rotation 20+   30+ 40d P! 62d @90d  ABD 70d @45d  abd 65d @45d  abd  Functional IR           Functional ER           Shoulder extension           Elbow extension           Elbow flexion             (Blank rows = not tested, N = WNL, * = concordant pain with testing)   PATIENT SURVEYS:  FOTO 24 -> 55; 04/02/23 44  TODAY'S TREATMENT:  Creating, reviewing, and completing below HEP     PATIENT EDUCATION:  POC, diagnosis, prognosis, HEP, and outcome measures.  Pt educated via explanation, demonstration, and handout (HEP).  Pt confirms understanding verbally.    HOME  EXERCISE PROGRAM: Access Code: [email or web address] URL: [email or web address] Date: 03/05/2023 Prepared by: Alphonzo Severance  Exercises - Ice  - 5 x daily - 7 x weekly - 1 sets - 1 reps - 20 minutes hold - Shoulder Flexion Wall Slide with Towel  - 1 x daily - 7 x weekly - 3 sets - 10 reps - Standing Shoulder Row with Anchored Resistance  - 1 x daily - 7 x weekly - 3 sets - 10 reps - Shoulder extension with resistance - Neutral  - 1 x daily - 7 x weekly - 3 sets - 10 reps - Shoulder External Rotation with Anchored Resistance  - 1 x daily - 7 x weekly - 3 sets - 10 reps   Treatment priorities     Eval (01/06/2023)              Gentle PROM working to 120 d of flexion and 30 deg of ext rotation              precautions                                                            [facility] Adult PT Treatment:                                                DATE: 05/03/23 Therapeutic Exercise: OH pulley 5 min Supine flexion 1000g ball 15x2 Supine press 1000g ball 15x2 S/L ER 1000g ball 15x2 S/L abd 1000g ball 15x2 Re-assessment for recertification  [facility] Adult PT Treatment:                                                DATE: 04/28/23 Therapeutic Exercise: Nustep L4 6 min Supine flexion 1000g ball 15x2 Supine press 1000g ball 15x2 Seated OH press 1000g ball 15x2 Sidelying: ER -> Flexion -> Horizontal abd - 15x ea x2 Supine hor abd YTB 15x2 Seated scaption 1000g ball 15x2 Hor abd YTB 15x Manual Therapy: STM to R pec minor, teres major and infraspinatus to address supected trigger points   [facility] Adult PT Treatment:                                                DATE: 04/20/23 Therapeutic Exercise: Nustep L4 8 min Supine flexion 1000g ball 15x2 Supine press 1000g ball 15x2 Seated scaption 1000g ball 15x2 Hor abd RTB 15x2  Seated ER RTB 15x2 Manual Therapy: Inferior and posterior glides 5x10 to promote flexion and abduction PROM/stretch to assess progress   ASSESSMENT:    CLINICAL IMPRESSION: Overall pain levels have declined but A/PROM as well as strength remain limited by sharp subacromial  pain levels.  AROM has increased slightly.  Strength levels remain below functional level due to pain. RC musculature appears intact.  Patient presents with S&S of subacromial inflammation.  Re-certification sent today.   OBJECTIVE IMPAIRMENTS: Pain, R shoulder ROM, R shoulder strength   ACTIVITY LIMITATIONS: reaching, lifting, driving   PERSONAL FACTORS: See medical history and pertinent history     REHAB POTENTIAL: Good   CLINICAL DECISION MAKING: Stable/uncomplicated   EVALUATION COMPLEXITY: Low     GOALS:     SHORT TERM GOALS: Target date: 02/03/2023   Norris will be >75% HEP compliant to improve carryover between sessions and facilitate independent management of condition   Evaluation (01/06/2023): ongoing Goal status: Ongoing due to limited post-op protocol     LONG TERM GOALS: Target date: 03/03/2023 (extended until 04/30/2023)   Pacie will improve FOTO score to 55 as a proxy for functional improvement   Evaluation/Baseline (01/06/2023): 24;  6/14: 47 04/02/23 4 Goal status: Ongoing     2.  Lea will demonstrate >130 degrees of active ROM in flexion to allow completion of activities involving reaching OH, not limited by pain   Evaluation/Baseline (01/06/2023): 90 degrees PROM 6/14: 80 AROM 04/02/23 90d AROM R AROM 05/03/23  120d  110d  L5  C7   Goal status: ongoing     3.  Keleigh will achieve >=60 degrees of shoulder ER at 90 degrees of abduction to allow proper shoulder mechanics during OH movement, not limited by pain    Evaluation/Baseline (01/06/2023): 20 degrees PROM 6/14: 50 04/02/23 62d R 05/03/23  148d P!  125d Scapular plane  85d  65d @45d  abd   Goal status: Met     4.  Khamia will be able to reach repeatedly into cabinet with >/= 3#, not limited by pain   Evaluation/Baseline (01/06/2023): limited 6/14: unable to reach into  cabinet; 04/13/23 Unable to reach above shoulder height w/o pain 05/03/23 Unable to reach above shoulder height w/o pain Goal status: ongoing       PLAN: PT FREQUENCY: 1-2x/week   PT DURATION: 8 weeks (Ending 07/03/2023)   PLANNED INTERVENTIONS: Therapeutic exercises, Aquatic therapy, Therapeutic activity, Neuro Muscular re-education, Gait training, Patient/Family education, Joint mobilization, Dry Needling, Electrical stimulation, Spinal mobilization and/or manipulation, Moist heat, Taping, Vasopneumatic device, Ionotophoresis 4mg /ml Dexamethasone, and Manual therapy   Hildred Laser PT 05/31/2023, 1:45 PM

## 2023-06-02 ENCOUNTER — Ambulatory Visit: Payer: Medicare Other | Attending: Internal Medicine

## 2023-06-02 DIAGNOSIS — M25511 Pain in right shoulder: Secondary | ICD-10-CM | POA: Insufficient documentation

## 2023-06-02 DIAGNOSIS — M6281 Muscle weakness (generalized): Secondary | ICD-10-CM | POA: Insufficient documentation

## 2023-06-02 DIAGNOSIS — G8929 Other chronic pain: Secondary | ICD-10-CM | POA: Insufficient documentation

## 2023-06-09 ENCOUNTER — Ambulatory Visit: Payer: Medicare Other

## 2023-06-09 DIAGNOSIS — M25511 Pain in right shoulder: Secondary | ICD-10-CM | POA: Diagnosis not present

## 2023-06-15 NOTE — Therapy (Unsigned)
PHYSICAL THERAPY TREATMENT NOTE     Patient Name: Taylor Edwards MRN: [id number] DOB:1940-12-17, 82 y.o., female Today's Date: 06/16/2023  PCP: Emilio Aspen, MD   REFERRING PROVIDER: Jones Broom, MD   PT End of Session - 06/16/23 1053     Visit Number 18    Number of Visits 24    Date for PT Re-Evaluation 07/03/23    Authorization Type [employer] MCR - FOTO    PT Start Time 1050    PT Stop Time 1130    PT Time Calculation (min) 40 min    Activity Tolerance Patient tolerated treatment well    Behavior During Therapy WFL for tasks assessed/performed               Past Medical History:  Diagnosis Date   Anemia    no current med.   Arthritis    knees   Diabetes mellitus    GERD (gastroesophageal reflux disease)    no current med.   Gout    Hypercholesteremia    Hypertension    has been on med. x 10 yrs.   Hypothyroid    IBS (irritable bowel syndrome)    Post-nasal drip    current cough   Sickle cell trait (HCC)    Urinary urgency    Past Surgical History:  Procedure Laterality Date   ABDOMINAL HYSTERECTOMY  1995   BUNIONECTOMY  08/25/2011   Procedure: Arbutus Leas;  Surgeon: Velna Ochs, MD;  Location: [facility];  Service: Orthopedics;  Laterality: Right;   BUNIONECTOMY Left 08/04/2022   Procedure: LEFT FOOT BUNIONECTOMY;  Surgeon: Marcene Corning, MD;  Location: [facility];  Service: Orthopedics;  Laterality: Left;   CATARACT EXTRACTION  2016   both eyes   COLONOSCOPY W/ BIOPSIES AND POLYPECTOMY     DILATION AND CURETTAGE OF UTERUS     INJECTION KNEE  08/25/2011   Procedure: KNEE INJECTION;  Surgeon: Velna Ochs, MD;  Location: [facility];  Service: Orthopedics;  Laterality: Bilateral;   KNEE ARTHROSCOPY  12/11/2008   bilat., with chondroplasty   KNEE JOINT MANIPULATION  10/30/2009   bilat.   OPEN REDUCTION INTERNAL FIXATION (ORIF) DISTAL RADIAL FRACTURE Left 11/17/2014   Procedure: OPEN REDUCTION  INTERNAL FIXATION (ORIF) LEFT DISTAL RADIUS  FRACTURE;  Surgeon: Dairl Ponder, MD;  Location: [facility];  Service: Orthopedics;  Laterality: Left;   SHOULDER ARTHROSCOPY WITH ROTATOR CUFF REPAIR AND SUBACROMIAL DECOMPRESSION Right 12/15/2022   Procedure: SHOULDER ARTHROSCOPY WITH ROTATOR CUFF REPAIR AND SUBACROMIAL DECOMPRESSION;  Surgeon: Jones Broom, MD;  Location: [facility];  Service: Orthopedics;  Laterality: Right;   TOTAL KNEE ARTHROPLASTY Bilateral 09/26/2009   bilat.   Patient Active Problem List   Diagnosis Date Noted   Posterior vitreous detachment, both eyes 03/26/2022   Diabetes mellitus without complication (HCC) 03/26/2022   Pseudophakia, both eyes 03/26/2022   History of total knee replacement, left 04/03/2019   History of total knee arthroplasty, right 04/03/2019   DERMATOFIBROMA 10/26/2007   ANEMIA, CHRONIC 10/26/2007   SICKLE CELL TRAIT 10/26/2007   ESSENTIAL HYPERTENSION 10/26/2007   GASTROESOPHAGEAL REFLUX DISEASE 10/26/2007   GASTRITIS 10/26/2007   HIATAL HERNIA 10/26/2007   CONSTIPATION, CHRONIC 10/26/2007   GUAIAC POSITIVE STOOL 10/26/2007    THERAPY DIAG:  Muscle weakness  Chronic right shoulder pain  Muscle weakness (generalized)   Rationale for Evaluation and Treatment: Rehabilitation  REFERRING DIAG: R R/C repair 3/26   PERTINENT HISTORY: Anemia, sickle cell trait, DM  TII, bil knee replacements   PRECAUTIONS/RESTRICTIONS:   R R/C repair 3/26 supraspinatus with biceps release   OP Date: 12/15/2022  2 weeks 12/29/2022  4 weeks 01/12/2023  6 weeks 01/26/2023  8 weeks 02/09/2023  10 weeks 02/23/2023  12 weeks 03/09/2023     SUBJECTIVE: R shoulder pain 2/10.  Had recent CSI and pain levels have diminished greatly   Pain:  Are you having pain? Yes Pain location: R shoulder NPRS scale: 2-5/10 Aggravating factors: movement Relieving factors: rest, ice Pain description: aching Stage: Acute Stability: getting better 24 hour  pattern: worse with movement   OBJECTIVE: (objective measures completed at initial evaluation unless otherwise dated)   GENERAL OBSERVATION:          Not wearing sling "I forgot it in the car"                                SENSATION:          Light touch: Deficits R C6           PALPATION: Expected TTP R shoulder   UPPER EXTREMITY AROM:   ROM Right 01/06/2023 Left 01/06/2023 R A/PROM  03/30/23 R AROM 05/03/23 !ROM 06/16/23  Shoulder flexion 150   120/120 120d 150d  Shoulder abduction     60/90 110d 160d  Shoulder internal rotation     /80 L5 L5  Shoulder external rotation     /50 C7 T1  Functional IR         Functional ER         Shoulder extension         Elbow extension         Elbow flexion           (Blank rows = not tested, N = WNL, * = concordant pain with testing)   UPPER EXTREMITY MMT:   MMT Right 01/06/2023 Left 01/06/2023 R 04/20/23 R 05/03/23  Shoulder flexion     3+ 3+ P!  Shoulder abduction (C5)     3 3+ P!  Shoulder ER     3+ 3+  Shoulder IR     3+ 3+  Middle trapezius     3 3+  Scaption     3 P!  Lower trapezius     3   Shoulder extension     3+ 4-  Grip strength        Cervical flexion (C1,C2)        Cervical S/B (C3)        Shoulder shrug (C4)        Elbow flexion (C6)        Elbow ext (C7)        Thumb ext (C8)        Finger abd (T1)        Grossly          (Blank rows = not tested, score listed is out of 5 possible points.  N = WNL, D = diminished, C = clear for gross weakness with myotome testing, * = concordant pain with testing)     UPPER EXTREMITY PROM:   PROM Right 01/06/2023 Left 01/06/2023 R 01/23/23 R  02/02/23 R  04/02/23 R 04/20/23 R 05/03/23  Shoulder flexion 90+   120 135d 135 150d P! 148d P!  Shoulder abduction      80d P! 80P! 90d P! 125d Scapular plane  Shoulder internal  rotation      70d  75d 85d  Shoulder external rotation 20+   30+ 40d P! 62d @90d  ABD 70d @45d  abd 65d @45d  abd  Functional IR           Functional ER            Shoulder extension           Elbow extension           Elbow flexion             (Blank rows = not tested, N = WNL, * = concordant pain with testing)   PATIENT SURVEYS:  FOTO 24 -> 55; 04/02/23 44              TODAY'S TREATMENT:  Creating, reviewing, and completing below HEP     PATIENT EDUCATION:  POC, diagnosis, prognosis, HEP, and outcome measures.  Pt educated via explanation, demonstration, and handout (HEP).  Pt confirms understanding verbally.    HOME EXERCISE PROGRAM: Access Code: [email or web address] URL: [email or web address] Date: 03/05/2023 Prepared by: Alphonzo Severance  Exercises - Ice  - 5 x daily - 7 x weekly - 1 sets - 1 reps - 20 minutes hold - Shoulder Flexion Wall Slide with Towel  - 1 x daily - 7 x weekly - 3 sets - 10 reps - Standing Shoulder Row with Anchored Resistance  - 1 x daily - 7 x weekly - 3 sets - 10 reps - Shoulder extension with resistance - Neutral  - 1 x daily - 7 x weekly - 3 sets - 10 reps - Shoulder External Rotation with Anchored Resistance  - 1 x daily - 7 x weekly - 3 sets - 10 reps   Treatment priorities     Eval (01/06/2023)              Gentle PROM working to 120 d of flexion and 30 deg of ext rotation              precautions                                                            [facility] Adult PT Treatment:                                                DATE: 06/16/23 Therapeutic Exercise: Nustep L4 6 min 5# KB ball carry shoulder height 122ft 5# KB carry 185 ft Scaption at wall w/towel 15x Scapular wall slides pillow case 15x Ball flexion on wall 15x Manual Therapy: Inferior and posterior glides 5x10 to promote flexion and abduction Supine PNF D1 F/E 15x  [facility] Adult PT Treatment:                                                DATE: 06/02/23 Therapeutic Exercise: Nustep L4 6 min 1000g ball carry shoulder height 15ft 5# KB carry 185 ft Scaption at wall w/towel 15x Scapular wall slides pink roller 15x Manual  Therapy: Inferior and posterior glides 5x10  to promote flexion and abduction Supine PNF D1 F/E 15x  [facility] Adult PT Treatment:                                                DATE: 05/03/23 Therapeutic Exercise: OH pulley 5 min Supine flexion 1000g ball 15x2 Supine press 1000g ball 15x2 S/L ER 1000g ball 15x2 S/L abd 1000g ball 15x2 Re-assessment for recertification  [facility] Adult PT Treatment:                                                DATE: 04/28/23 Therapeutic Exercise: Nustep L4 6 min Supine flexion 1000g ball 15x2 Supine press 1000g ball 15x2 Seated OH press 1000g ball 15x2 Sidelying: ER -> Flexion -> Horizontal abd - 15x ea x2 Supine hor abd YTB 15x2 Seated scaption 1000g ball 15x2 Hor abd YTB 15x Manual Therapy: STM to R pec minor, teres major and infraspinatus to address supected trigger points   [facility] Adult PT Treatment:                                                DATE: 04/20/23 Therapeutic Exercise: Nustep L4 8 min Supine flexion 1000g ball 15x2 Supine press 1000g ball 15x2 Seated scaption 1000g ball 15x2 Hor abd RTB 15x2  Seated ER RTB 15x2 Manual Therapy: Inferior and posterior glides 5x10 to promote flexion and abduction PROM/stretch to assess progress   ASSESSMENT:   CLINICAL IMPRESSION: Symptom much improved following recent CSI.  Continued to stress ROM and strength. AROM increased as noted.  Able to tolerate previously painful tasks without discomfort.   OBJECTIVE IMPAIRMENTS: Pain, R shoulder ROM, R shoulder strength   ACTIVITY LIMITATIONS: reaching, lifting, driving   PERSONAL FACTORS: See medical history and pertinent history     REHAB POTENTIAL: Good   CLINICAL DECISION MAKING: Stable/uncomplicated   EVALUATION COMPLEXITY: Low     GOALS:     SHORT TERM GOALS: Target date: 02/03/2023   Yousra will be >75% HEP compliant to improve carryover between sessions and facilitate independent management of condition   Evaluation (01/06/2023):  ongoing Goal status: Met     LONG TERM GOALS: Target date: 03/03/2023 (extended until 04/30/2023)   Lamija will improve FOTO score to 55 as a proxy for functional improvement   Evaluation/Baseline (01/06/2023): 24;  6/14: 47 04/02/23 4 Goal status: Ongoing     2.  Rickya will demonstrate >130 degrees of active ROM in flexion to allow completion of activities involving reaching OH, not limited by pain   Evaluation/Baseline (01/06/2023): 90 degrees PROM 6/14: 80 AROM 04/02/23 90d AROM R AROM 05/03/23  120d  110d  L5  C7   Goal status: ongoing     3.  Adoria will achieve >=60 degrees of shoulder ER at 90 degrees of abduction to allow proper shoulder mechanics during OH movement, not limited by pain    Evaluation/Baseline (01/06/2023): 20 degrees PROM 6/14: 50 04/02/23 62d R 05/03/23  148d P!  125d Scapular plane  85d  65d @45d  abd   Goal status: Met     4.  Avereigh will be able to reach repeatedly into cabinet with >/= 3#, not limited by pain   Evaluation/Baseline (01/06/2023): limited 6/14: unable to reach into cabinet; 04/13/23 Unable to reach above shoulder height w/o pain 05/03/23 Unable to reach above shoulder height w/o pain Goal status: ongoing       PLAN: PT FREQUENCY: 1-2x/week   PT DURATION: 8 weeks (Ending 07/03/2023)   PLANNED INTERVENTIONS: Therapeutic exercises, Aquatic therapy, Therapeutic activity, Neuro Muscular re-education, Gait training, Patient/Family education, Joint mobilization, Dry Needling, Electrical stimulation, Spinal mobilization and/or manipulation, Moist heat, Taping, Vasopneumatic device, Ionotophoresis 4mg /ml Dexamethasone, and Manual therapy   Hildred Laser PT 06/16/2023, 11:27 AM

## 2023-06-16 ENCOUNTER — Ambulatory Visit: Payer: Medicare Other

## 2023-06-16 DIAGNOSIS — M6281 Muscle weakness (generalized): Secondary | ICD-10-CM | POA: Diagnosis not present

## 2023-06-16 DIAGNOSIS — G8929 Other chronic pain: Secondary | ICD-10-CM

## 2023-06-16 DIAGNOSIS — M25511 Pain in right shoulder: Secondary | ICD-10-CM | POA: Diagnosis not present

## 2023-06-21 NOTE — Therapy (Unsigned)
PHYSICAL THERAPY TREATMENT NOTE     Patient Name: Taylor Edwards MRN: [id number] DOB:08/20/1941, 82 y.o., female Today's Date: 06/21/2023  PCP: Emilio Aspen, MD   REFERRING PROVIDER: Jones Broom, MD       Past Medical History:  Diagnosis Date   Anemia    no current med.   Arthritis    knees   Diabetes mellitus    GERD (gastroesophageal reflux disease)    no current med.   Gout    Hypercholesteremia    Hypertension    has been on med. x 10 yrs.   Hypothyroid    IBS (irritable bowel syndrome)    Post-nasal drip    current cough   Sickle cell trait (HCC)    Urinary urgency    Past Surgical History:  Procedure Laterality Date   ABDOMINAL HYSTERECTOMY  1995   BUNIONECTOMY  08/25/2011   Procedure: Arbutus Leas;  Surgeon: Velna Ochs, MD;  Location: [facility];  Service: Orthopedics;  Laterality: Right;   BUNIONECTOMY Left 08/04/2022   Procedure: LEFT FOOT BUNIONECTOMY;  Surgeon: Marcene Corning, MD;  Location: [facility];  Service: Orthopedics;  Laterality: Left;   CATARACT EXTRACTION  2016   both eyes   COLONOSCOPY W/ BIOPSIES AND POLYPECTOMY     DILATION AND CURETTAGE OF UTERUS     INJECTION KNEE  08/25/2011   Procedure: KNEE INJECTION;  Surgeon: Velna Ochs, MD;  Location: [facility];  Service: Orthopedics;  Laterality: Bilateral;   KNEE ARTHROSCOPY  12/11/2008   bilat., with chondroplasty   KNEE JOINT MANIPULATION  10/30/2009   bilat.   OPEN REDUCTION INTERNAL FIXATION (ORIF) DISTAL RADIAL FRACTURE Left 11/17/2014   Procedure: OPEN REDUCTION INTERNAL FIXATION (ORIF) LEFT DISTAL RADIUS  FRACTURE;  Surgeon: Dairl Ponder, MD;  Location: [facility];  Service: Orthopedics;  Laterality: Left;   SHOULDER ARTHROSCOPY WITH ROTATOR CUFF REPAIR AND SUBACROMIAL DECOMPRESSION Right 12/15/2022   Procedure: SHOULDER ARTHROSCOPY WITH ROTATOR CUFF REPAIR AND SUBACROMIAL DECOMPRESSION;  Surgeon: Jones Broom, MD;  [facility];  Service: Orthopedics;  Laterality: Right;   TOTAL KNEE ARTHROPLASTY Bilateral 09/26/2009   bilat.   Patient Active Problem List   Diagnosis Date Noted   Posterior vitreous detachment, both eyes 03/26/2022   Diabetes mellitus without complication (HCC) 03/26/2022   Pseudophakia, both eyes 03/26/2022   History of total knee replacement, left 04/03/2019   History of total knee arthroplasty, right 04/03/2019   DERMATOFIBROMA 10/26/2007   ANEMIA, CHRONIC 10/26/2007   SICKLE CELL TRAIT 10/26/2007   ESSENTIAL HYPERTENSION 10/26/2007   GASTROESOPHAGEAL REFLUX DISEASE 10/26/2007   GASTRITIS 10/26/2007   HIATAL HERNIA 10/26/2007   CONSTIPATION, CHRONIC 10/26/2007   GUAIAC POSITIVE STOOL 10/26/2007    THERAPY DIAG:  No diagnosis found.   Rationale for Evaluation and Treatment: Rehabilitation  REFERRING DIAG: R R/C repair 3/26   PERTINENT HISTORY: Anemia, sickle cell trait, DM TII, bil knee replacements   PRECAUTIONS/RESTRICTIONS:   R R/C repair 3/26 supraspinatus with biceps release   OP Date: 12/15/2022  2 weeks 12/29/2022  4 weeks 01/12/2023  6 weeks 01/26/2023  8 weeks 02/09/2023  10 weeks 02/23/2023  12 weeks 03/09/2023     SUBJECTIVE: R shoulder pain 2/10.  Had recent CSI and pain levels have diminished greatly   Pain:  Are you having pain? Yes Pain location: R shoulder NPRS scale: 2-5/10 Aggravating factors: movement Relieving factors: rest, ice Pain description: aching Stage: Acute Stability: getting better 24 hour  pattern: worse with movement   OBJECTIVE: (objective measures completed at initial evaluation unless otherwise dated)   GENERAL OBSERVATION:          Not wearing sling "I forgot it in the car"                                SENSATION:          Light touch: Deficits R C6           PALPATION: Expected TTP R shoulder   UPPER EXTREMITY AROM:   ROM Right 01/06/2023 Left 01/06/2023 R A/PROM  03/30/23 R AROM 05/03/23  !ROM 06/16/23  Shoulder flexion 150   120/120 120d 150d  Shoulder abduction     60/90 110d 160d  Shoulder internal rotation     /80 L5 L5  Shoulder external rotation     /50 C7 T1  Functional IR         Functional ER         Shoulder extension         Elbow extension         Elbow flexion           (Blank rows = not tested, N = WNL, * = concordant pain with testing)   UPPER EXTREMITY MMT:   MMT Right 01/06/2023 Left 01/06/2023 R 04/20/23 R 05/03/23  Shoulder flexion     3+ 3+ P!  Shoulder abduction (C5)     3 3+ P!  Shoulder ER     3+ 3+  Shoulder IR     3+ 3+  Middle trapezius     3 3+  Scaption     3 P!  Lower trapezius     3   Shoulder extension     3+ 4-  Grip strength        Cervical flexion (C1,C2)        Cervical S/B (C3)        Shoulder shrug (C4)        Elbow flexion (C6)        Elbow ext (C7)        Thumb ext (C8)        Finger abd (T1)        Grossly          (Blank rows = not tested, score listed is out of 5 possible points.  N = WNL, D = diminished, C = clear for gross weakness with myotome testing, * = concordant pain with testing)     UPPER EXTREMITY PROM:   PROM Right 01/06/2023 Left 01/06/2023 R 01/23/23 R  02/02/23 R  04/02/23 R 04/20/23 R 05/03/23  Shoulder flexion 90+   120 135d 135 150d P! 148d P!  Shoulder abduction      80d P! 80P! 90d P! 125d Scapular plane  Shoulder internal rotation      70d  75d 85d  Shoulder external rotation 20+   30+ 40d P! 62d @90d  ABD 70d @45d  abd 65d @45d  abd  Functional IR           Functional ER           Shoulder extension           Elbow extension           Elbow flexion             (Blank rows = not tested, N =  WNL, * = concordant pain with testing)   PATIENT SURVEYS:  FOTO 24 -> 55; 04/02/23 44              TODAY'S TREATMENT:  Creating, reviewing, and completing below HEP     PATIENT EDUCATION:  POC, diagnosis, prognosis, HEP, and outcome measures.  Pt educated via explanation, demonstration, and  handout (HEP).  Pt confirms understanding verbally.    HOME EXERCISE PROGRAM: Access Code: [email or web address] URL: [email or web address] Date: 03/05/2023 Prepared by: Alphonzo Severance  Exercises - Ice  - 5 x daily - 7 x weekly - 1 sets - 1 reps - 20 minutes hold - Shoulder Flexion Wall Slide with Towel  - 1 x daily - 7 x weekly - 3 sets - 10 reps - Standing Shoulder Row with Anchored Resistance  - 1 x daily - 7 x weekly - 3 sets - 10 reps - Shoulder extension with resistance - Neutral  - 1 x daily - 7 x weekly - 3 sets - 10 reps - Shoulder External Rotation with Anchored Resistance  - 1 x daily - 7 x weekly - 3 sets - 10 reps   Treatment priorities     Eval (01/06/2023)              Gentle PROM working to 120 d of flexion and 30 deg of ext rotation              precautions                                                            [facility] Adult PT Treatment:                                                DATE: 06/16/23 Therapeutic Exercise: Nustep L4 6 min 5# KB ball carry shoulder height 111ft 5# KB carry 185 ft Scaption at wall w/towel 15x Scapular wall slides pillow case 15x Ball flexion on wall 15x Manual Therapy: Inferior and posterior glides 5x10 to promote flexion and abduction Supine PNF D1 F/E 15x  [facility] Adult PT Treatment:                                                DATE: 06/02/23 Therapeutic Exercise: Nustep L4 6 min 1000g ball carry shoulder height 161ft 5# KB carry 185 ft Scaption at wall w/towel 15x Scapular wall slides pink roller 15x Manual Therapy: Inferior and posterior glides 5x10 to promote flexion and abduction Supine PNF D1 F/E 15x  [facility] Adult PT Treatment:                                                DATE: 05/03/23 Therapeutic Exercise: OH pulley 5 min Supine flexion 1000g ball 15x2 Supine press 1000g ball 15x2 S/L ER 1000g ball 15x2 S/L abd 1000g ball 15x2 Re-assessment for recertification  [facility] Adult PT Treatment:  DATE: 04/28/23 Therapeutic Exercise: Nustep L4 6 min Supine flexion 1000g ball 15x2 Supine press 1000g ball 15x2 Seated OH press 1000g ball 15x2 Sidelying: ER -> Flexion -> Horizontal abd - 15x ea x2 Supine hor abd YTB 15x2 Seated scaption 1000g ball 15x2 Hor abd YTB 15x Manual Therapy: STM to R pec minor, teres major and infraspinatus to address supected trigger points   [facility] Adult PT Treatment:                                                DATE: 04/20/23 Therapeutic Exercise: Nustep L4 8 min Supine flexion 1000g ball 15x2 Supine press 1000g ball 15x2 Seated scaption 1000g ball 15x2 Hor abd RTB 15x2  Seated ER RTB 15x2 Manual Therapy: Inferior and posterior glides 5x10 to promote flexion and abduction PROM/stretch to assess progress   ASSESSMENT:   CLINICAL IMPRESSION: Symptom much improved following recent CSI.  Continued to stress ROM and strength. AROM increased as noted.  Able to tolerate previously painful tasks without discomfort.   OBJECTIVE IMPAIRMENTS: Pain, R shoulder ROM, R shoulder strength   ACTIVITY LIMITATIONS: reaching, lifting, driving   PERSONAL FACTORS: See medical history and pertinent history     REHAB POTENTIAL: Good   CLINICAL DECISION MAKING: Stable/uncomplicated   EVALUATION COMPLEXITY: Low     GOALS:     SHORT TERM GOALS: Target date: 02/03/2023   Latice will be >75% HEP compliant to improve carryover between sessions and facilitate independent management of condition   Evaluation (01/06/2023): ongoing Goal status: Met     LONG TERM GOALS: Target date: 03/03/2023 (extended until 04/30/2023)   Nadalyn will improve FOTO score to 55 as a proxy for functional improvement   Evaluation/Baseline (01/06/2023): 24;  6/14: 47 04/02/23 4 Goal status: Ongoing     2.  Orlinda will demonstrate >130 degrees of active ROM in flexion to allow completion of activities involving reaching OH, not limited by pain    Evaluation/Baseline (01/06/2023): 90 degrees PROM 6/14: 80 AROM 04/02/23 90d AROM R AROM 05/03/23  120d  110d  L5  C7   Goal status: ongoing     3.  Contina will achieve >=60 degrees of shoulder ER at 90 degrees of abduction to allow proper shoulder mechanics during OH movement, not limited by pain    Evaluation/Baseline (01/06/2023): 20 degrees PROM 6/14: 50 04/02/23 62d R 05/03/23  148d P!  125d Scapular plane  85d  65d @45d  abd   Goal status: Met     4.  Zenola will be able to reach repeatedly into cabinet with >/= 3#, not limited by pain   Evaluation/Baseline (01/06/2023): limited 6/14: unable to reach into cabinet; 04/13/23 Unable to reach above shoulder height w/o pain 05/03/23 Unable to reach above shoulder height w/o pain Goal status: ongoing       PLAN: PT FREQUENCY: 1-2x/week   PT DURATION: 8 weeks (Ending 07/03/2023)   PLANNED INTERVENTIONS: Therapeutic exercises, Aquatic therapy, Therapeutic activity, Neuro Muscular re-education, Gait training, Patient/Family education, Joint mobilization, Dry Needling, Electrical stimulation, Spinal mobilization and/or manipulation, Moist heat, Taping, Vasopneumatic device, Ionotophoresis 4mg /ml Dexamethasone, and Manual therapy   Hildred Laser PT 06/21/2023, 1:49 PM

## 2023-06-22 DIAGNOSIS — E119 Type 2 diabetes mellitus without complications: Secondary | ICD-10-CM | POA: Diagnosis not present

## 2023-06-22 DIAGNOSIS — M79661 Pain in right lower leg: Secondary | ICD-10-CM | POA: Diagnosis not present

## 2023-06-22 DIAGNOSIS — M25551 Pain in right hip: Secondary | ICD-10-CM | POA: Diagnosis not present

## 2023-06-23 ENCOUNTER — Ambulatory Visit: Payer: Medicare Other | Attending: Internal Medicine

## 2023-06-23 ENCOUNTER — Other Ambulatory Visit: Payer: Self-pay | Admitting: Internal Medicine

## 2023-06-23 ENCOUNTER — Ambulatory Visit
Admission: RE | Admit: 2023-06-23 | Discharge: 2023-06-23 | Disposition: A | Discharge status: Home / Self Care | Payer: Medicare Other | Source: Ambulatory Visit | Attending: Internal Medicine | Admitting: Internal Medicine

## 2023-06-23 DIAGNOSIS — G8929 Other chronic pain: Secondary | ICD-10-CM | POA: Insufficient documentation

## 2023-06-23 DIAGNOSIS — M25511 Pain in right shoulder: Secondary | ICD-10-CM | POA: Insufficient documentation

## 2023-06-23 DIAGNOSIS — M6281 Muscle weakness (generalized): Secondary | ICD-10-CM | POA: Diagnosis not present

## 2023-06-23 DIAGNOSIS — M79661 Pain in right lower leg: Secondary | ICD-10-CM | POA: Diagnosis not present

## 2023-07-07 DIAGNOSIS — M7062 Trochanteric bursitis, left hip: Secondary | ICD-10-CM | POA: Diagnosis not present

## 2023-07-07 DIAGNOSIS — M7061 Trochanteric bursitis, right hip: Secondary | ICD-10-CM | POA: Diagnosis not present

## 2023-08-12 ENCOUNTER — Other Ambulatory Visit: Payer: Self-pay

## 2023-08-12 MED ORDER — DICLOFENAC SODIUM 1 % EX GEL
Freq: Four times a day (QID) | CUTANEOUS | 0 refills | Status: DC
  Filled 2023-08-12: qty 100, 25d supply, fill #0

## 2023-08-27 DIAGNOSIS — I129 Hypertensive chronic kidney disease with stage 1 through stage 4 chronic kidney disease, or unspecified chronic kidney disease: Secondary | ICD-10-CM | POA: Diagnosis not present

## 2023-08-27 DIAGNOSIS — E039 Hypothyroidism, unspecified: Secondary | ICD-10-CM | POA: Diagnosis not present

## 2023-08-27 DIAGNOSIS — E1169 Type 2 diabetes mellitus with other specified complication: Secondary | ICD-10-CM | POA: Diagnosis not present

## 2023-08-27 DIAGNOSIS — Z23 Encounter for immunization: Secondary | ICD-10-CM | POA: Diagnosis not present

## 2023-08-27 DIAGNOSIS — I700 Atherosclerosis of aorta: Secondary | ICD-10-CM | POA: Diagnosis not present

## 2023-08-27 DIAGNOSIS — K8020 Calculus of gallbladder without cholecystitis without obstruction: Secondary | ICD-10-CM | POA: Diagnosis not present

## 2023-08-27 DIAGNOSIS — N1831 Chronic kidney disease, stage 3a: Secondary | ICD-10-CM | POA: Diagnosis not present

## 2023-08-27 DIAGNOSIS — Z0000 Encounter for general adult medical examination without abnormal findings: Secondary | ICD-10-CM | POA: Diagnosis not present

## 2023-08-27 DIAGNOSIS — E1122 Type 2 diabetes mellitus with diabetic chronic kidney disease: Secondary | ICD-10-CM | POA: Diagnosis not present

## 2023-08-27 DIAGNOSIS — M25551 Pain in right hip: Secondary | ICD-10-CM | POA: Diagnosis not present

## 2023-08-27 DIAGNOSIS — E1121 Type 2 diabetes mellitus with diabetic nephropathy: Secondary | ICD-10-CM | POA: Diagnosis not present

## 2023-09-10 DIAGNOSIS — L820 Inflamed seborrheic keratosis: Secondary | ICD-10-CM | POA: Diagnosis not present

## 2023-09-10 DIAGNOSIS — L0390 Cellulitis, unspecified: Secondary | ICD-10-CM | POA: Diagnosis not present

## 2023-09-28 DIAGNOSIS — R350 Frequency of micturition: Secondary | ICD-10-CM | POA: Diagnosis not present

## 2023-12-06 ENCOUNTER — Ambulatory Visit
Admission: RE | Admit: 2023-12-06 | Discharge: 2023-12-06 | Disposition: A | Discharge status: Home / Self Care | Source: Ambulatory Visit | Attending: Physician Assistant | Admitting: Physician Assistant

## 2023-12-06 ENCOUNTER — Other Ambulatory Visit: Payer: Self-pay | Admitting: Physician Assistant

## 2023-12-06 DIAGNOSIS — W19XXXA Unspecified fall, initial encounter: Secondary | ICD-10-CM

## 2023-12-06 DIAGNOSIS — R519 Headache, unspecified: Secondary | ICD-10-CM | POA: Diagnosis not present

## 2023-12-06 DIAGNOSIS — S022XXA Fracture of nasal bones, initial encounter for closed fracture: Secondary | ICD-10-CM | POA: Diagnosis not present

## 2023-12-13 ENCOUNTER — Other Ambulatory Visit: Payer: Self-pay | Admitting: Internal Medicine

## 2023-12-13 DIAGNOSIS — R911 Solitary pulmonary nodule: Secondary | ICD-10-CM | POA: Diagnosis not present

## 2023-12-13 DIAGNOSIS — Z0000 Encounter for general adult medical examination without abnormal findings: Secondary | ICD-10-CM

## 2023-12-13 DIAGNOSIS — Z9181 History of falling: Secondary | ICD-10-CM | POA: Diagnosis not present

## 2023-12-13 DIAGNOSIS — N1831 Chronic kidney disease, stage 3a: Secondary | ICD-10-CM | POA: Diagnosis not present

## 2023-12-13 DIAGNOSIS — R519 Headache, unspecified: Secondary | ICD-10-CM | POA: Diagnosis not present

## 2023-12-13 DIAGNOSIS — E1169 Type 2 diabetes mellitus with other specified complication: Secondary | ICD-10-CM | POA: Diagnosis not present

## 2023-12-13 DIAGNOSIS — R600 Localized edema: Secondary | ICD-10-CM | POA: Diagnosis not present

## 2023-12-13 DIAGNOSIS — E1121 Type 2 diabetes mellitus with diabetic nephropathy: Secondary | ICD-10-CM | POA: Diagnosis not present

## 2023-12-13 DIAGNOSIS — R8290 Unspecified abnormal findings in urine: Secondary | ICD-10-CM | POA: Diagnosis not present

## 2023-12-13 DIAGNOSIS — G8929 Other chronic pain: Secondary | ICD-10-CM | POA: Diagnosis not present

## 2023-12-15 DIAGNOSIS — M25561 Pain in right knee: Secondary | ICD-10-CM | POA: Diagnosis not present

## 2023-12-29 DIAGNOSIS — M7061 Trochanteric bursitis, right hip: Secondary | ICD-10-CM | POA: Diagnosis not present

## 2023-12-29 DIAGNOSIS — M79662 Pain in left lower leg: Secondary | ICD-10-CM | POA: Diagnosis not present

## 2024-01-14 ENCOUNTER — Ambulatory Visit

## 2024-02-01 ENCOUNTER — Ambulatory Visit

## 2024-02-01 NOTE — Therapy (Deleted)
 OUTPATIENT PHYSICAL THERAPY LOWER EXTREMITY EVALUATION   Patient Name: CHANNY KNIBBS MRN: [id number] DOB:07/02/1941, 83 y.o., female Today's Date: 02/01/2024  END OF SESSION:   Past Medical History:  Diagnosis Date   Anemia    no current med.   Arthritis    knees   Diabetes mellitus    GERD (gastroesophageal reflux disease)    no current med.   Gout    Hypercholesteremia    Hypertension    has been on med. x 10 yrs.   Hypothyroid    IBS (irritable bowel syndrome)    Post-nasal drip    current cough   Sickle cell trait (HCC)    Urinary urgency    Past Surgical History:  Procedure Laterality Date   ABDOMINAL HYSTERECTOMY  1995   BUNIONECTOMY  08/25/2011   Procedure: Tillman Folks;  Surgeon: Alphonzo Ask, MD;  Location: [facility];  Service: Orthopedics;  Laterality: Right;   BUNIONECTOMY Left 08/04/2022   Procedure: LEFT FOOT BUNIONECTOMY;  Surgeon: Dayne Even, MD;  Location: [facility];  Service: Orthopedics;  Laterality: Left;   CATARACT EXTRACTION  2016   both eyes   COLONOSCOPY W/ BIOPSIES AND POLYPECTOMY     DILATION AND CURETTAGE OF UTERUS     INJECTION KNEE  08/25/2011   Procedure: KNEE INJECTION;  Surgeon: Alphonzo Ask, MD;  Location: [facility];  Service: Orthopedics;  Laterality: Bilateral;   KNEE ARTHROSCOPY  12/11/2008   bilat., with chondroplasty   KNEE JOINT MANIPULATION  10/30/2009   bilat.   OPEN REDUCTION INTERNAL FIXATION (ORIF) DISTAL RADIAL FRACTURE Left 11/17/2014   Procedure: OPEN REDUCTION INTERNAL FIXATION (ORIF) LEFT DISTAL RADIUS  FRACTURE;  Surgeon: Florida Hurter, MD;  Location: [facility];  Service: Orthopedics;  Laterality: Left;   SHOULDER ARTHROSCOPY WITH ROTATOR CUFF REPAIR AND SUBACROMIAL DECOMPRESSION Right 12/15/2022   Procedure: SHOULDER ARTHROSCOPY WITH ROTATOR CUFF REPAIR AND SUBACROMIAL DECOMPRESSION;  Surgeon: Sammye Cristal, MD;  Location: [facility];  Service: Orthopedics;   Laterality: Right;   TOTAL KNEE ARTHROPLASTY Bilateral 09/26/2009   bilat.   Patient Active Problem List   Diagnosis Date Noted   Posterior vitreous detachment, both eyes 03/26/2022   Diabetes mellitus without complication (HCC) 03/26/2022   Pseudophakia, both eyes 03/26/2022   History of total knee replacement, left 04/03/2019   History of total knee arthroplasty, right 04/03/2019   DERMATOFIBROMA 10/26/2007   ANEMIA, CHRONIC 10/26/2007   SICKLE CELL TRAIT 10/26/2007   ESSENTIAL HYPERTENSION 10/26/2007   GASTROESOPHAGEAL REFLUX DISEASE 10/26/2007   GASTRITIS 10/26/2007   HIATAL HERNIA 10/26/2007   CONSTIPATION, CHRONIC 10/26/2007   GUAIAC POSITIVE STOOL 10/26/2007    PCP: Benedetta Bradley, MD   REFERRING PROVIDER: Dayne Even, MD  REFERRING DIAG: Right hip greater trochanteric bursitis  THERAPY DIAG:  No diagnosis found.  Rationale for Evaluation and Treatment: Rehabilitation  ONSET DATE: chronic  SUBJECTIVE:   SUBJECTIVE STATEMENT: ***  PERTINENT HISTORY: 07/07/2023 I had a long discussion with the patient. She most likely has some gluteal tendinopathy which is causing some chronic trochanteric bursal pain. We discussed options today. Unfortunately, she has had some significant issues with family recently. We proceeded with bilateral trochanteric bursal injections. I discussed physical therapy, but she just finished PT for shoulder issues which were unrelated. She would like to see how long the injections will last. I told her that it may be temporary. I discussed additional options such as further diagnostic testing if she does not respond  appropriately to conservative management. Hopefully, she will do well for a while. Follow-up for now as needed. All questions were encouraged and answered. PAIN:  Are you having pain? {OPRCPAIN:27236}  PRECAUTIONS: None  RED FLAGS: None   WEIGHT BEARING RESTRICTIONS: No  FALLS:  Has patient fallen in last 6 months?  {fallsyesno:27318}  OCCUPATION: clerical  PLOF: Independent  PATIENT GOALS: To manage my hip issues  NEXT MD VISIT: TBD  OBJECTIVE:  Note: Objective measures were completed at Evaluation unless otherwise noted.  DIAGNOSTIC FINDINGS: none available  PATIENT SURVEYS:  LEFS ***  MUSCLE LENGTH: Hamstrings: Right *** deg; Left *** deg Andy Bannister test: Right *** deg; Left *** deg  POSTURE: {posture:25561}  PALPATION: ***  LOWER EXTREMITY ROM:  {AROM/PROM:27142} ROM Right eval Left eval  Hip flexion    Hip extension    Hip abduction    Hip adduction    Hip internal rotation    Hip external rotation    Knee flexion    Knee extension    Ankle dorsiflexion    Ankle plantarflexion    Ankle inversion    Ankle eversion     (Blank rows = not tested)  LOWER EXTREMITY MMT:  MMT Right eval Left eval  Hip flexion    Hip extension    Hip abduction    Hip adduction    Hip internal rotation    Hip external rotation    Knee flexion    Knee extension    Ankle dorsiflexion    Ankle plantarflexion    Ankle inversion    Ankle eversion     (Blank rows = not tested)  LOWER EXTREMITY SPECIAL TESTS:  Hip special tests: Portia Brittle (FABER) test: {pos/neg:25230}, Trendelenburg test: {pos/neg:25230}, Thomas test: {pos/neg:25230}, Ober's test: {pos/neg:25230}, Hip scouring test: {pos/neg:25230}, and Anterior hip impingement test: {pos/neg:25230}  FUNCTIONAL TESTS:  30 seconds chair stand test  GAIT: Distance walked: *** Assistive device utilized: {Assistive devices:23999} Level of assistance: {Levels of assistance:24026} Comments: ***                                                                                                                                TREATMENT DATE: ***    PATIENT EDUCATION:  Education details: Discussed eval findings, rehab rationale and POC and patient is in agreement  Person educated: Patient Education method: Explanation Education  comprehension: verbalized understanding and needs further education  HOME EXERCISE PROGRAM: ***  ASSESSMENT:  CLINICAL IMPRESSION: Patient is a *** y.o. *** who was seen today for physical therapy evaluation and treatment for ***.   OBJECTIVE IMPAIRMENTS: {opptimpairments:25111}.   ACTIVITY LIMITATIONS: {activitylimitations:27494}  PERSONAL FACTORS: {Personal factors:25162} are also affecting patient's functional outcome.   REHAB POTENTIAL: Good  CLINICAL DECISION MAKING: Stable/uncomplicated  EVALUATION COMPLEXITY: Low   GOALS: Goals reviewed with patient? No  SHORT TERM GOALS: Target date: 02/22/2024   Patient to demonstrate independence in HEP  Baseline: Goal status: INITIAL  2.  ***  Baseline:  Goal status: INITIAL  3.  *** Baseline:  Goal status: INITIAL  4.  *** Baseline:  Goal status: INITIAL  5.  *** Baseline:  Goal status: INITIAL  6.  *** Baseline:  Goal status: INITIAL  LONG TERM GOALS: Target date: 03/14/2024    Patient will increase 30s chair stand reps from *** to *** with/without arms to demonstrate and improved functional ability with less pain/difficulty as well as reduce fall risk.  Baseline:  Goal status: INITIAL  2.  Patient will acknowledge ***/10 pain at least once during episode of care   Baseline:  Goal status: INITIAL  3.  Patient will score at least ***% on FOTO to signify clinically meaningful improvement in functional abilities.   Baseline:  Goal status: INITIAL  4.  *** Baseline:  Goal status: INITIAL  5.  *** Baseline:  Goal status: INITIAL  6.  *** Baseline:  Goal status: INITIAL   PLAN:  PT FREQUENCY: 2x/week  PT DURATION: 6 weeks  PLANNED INTERVENTIONS: 97110-Therapeutic exercises, 97530- Therapeutic activity, 97112- Neuromuscular re-education, 97535- Self Care, 11914- Manual therapy, [phone]- Gait training, Patient/Family education, Balance training, Stair training, Dry Needling, Joint mobilization, and  DME instructions  PLAN FOR NEXT SESSION: HEP review and update, manual techniques as appropriate, aerobic tasks, ROM and flexibility activities, strengthening and PREs, TPDN, gait and balance training as needed     Eldon Greenland, PT 02/01/2024, 12:40 PM

## 2024-02-02 ENCOUNTER — Ambulatory Visit

## 2024-02-04 DIAGNOSIS — M7061 Trochanteric bursitis, right hip: Secondary | ICD-10-CM | POA: Diagnosis not present

## 2024-02-09 DIAGNOSIS — R0602 Shortness of breath: Secondary | ICD-10-CM | POA: Diagnosis not present

## 2024-02-09 DIAGNOSIS — R5383 Other fatigue: Secondary | ICD-10-CM | POA: Diagnosis not present

## 2024-02-09 DIAGNOSIS — E559 Vitamin D deficiency, unspecified: Secondary | ICD-10-CM | POA: Diagnosis not present

## 2024-02-09 DIAGNOSIS — D539 Nutritional anemia, unspecified: Secondary | ICD-10-CM | POA: Diagnosis not present

## 2024-02-09 DIAGNOSIS — G8929 Other chronic pain: Secondary | ICD-10-CM | POA: Diagnosis not present

## 2024-02-09 DIAGNOSIS — Z79899 Other long term (current) drug therapy: Secondary | ICD-10-CM | POA: Diagnosis not present

## 2024-02-09 DIAGNOSIS — E119 Type 2 diabetes mellitus without complications: Secondary | ICD-10-CM | POA: Diagnosis not present

## 2024-02-09 DIAGNOSIS — M129 Arthropathy, unspecified: Secondary | ICD-10-CM | POA: Diagnosis not present

## 2024-02-09 DIAGNOSIS — M25551 Pain in right hip: Secondary | ICD-10-CM | POA: Diagnosis not present

## 2024-02-09 DIAGNOSIS — M545 Low back pain, unspecified: Secondary | ICD-10-CM | POA: Diagnosis not present

## 2024-02-09 DIAGNOSIS — E7800 Pure hypercholesterolemia, unspecified: Secondary | ICD-10-CM | POA: Diagnosis not present

## 2024-02-10 DIAGNOSIS — E1169 Type 2 diabetes mellitus with other specified complication: Secondary | ICD-10-CM | POA: Diagnosis not present

## 2024-02-10 DIAGNOSIS — R600 Localized edema: Secondary | ICD-10-CM | POA: Diagnosis not present

## 2024-02-10 DIAGNOSIS — R911 Solitary pulmonary nodule: Secondary | ICD-10-CM | POA: Diagnosis not present

## 2024-02-10 DIAGNOSIS — R21 Rash and other nonspecific skin eruption: Secondary | ICD-10-CM | POA: Diagnosis not present

## 2024-02-10 DIAGNOSIS — N1831 Chronic kidney disease, stage 3a: Secondary | ICD-10-CM | POA: Diagnosis not present

## 2024-02-11 DIAGNOSIS — Z79899 Other long term (current) drug therapy: Secondary | ICD-10-CM | POA: Diagnosis not present

## 2024-02-16 ENCOUNTER — Other Ambulatory Visit: Payer: Self-pay | Admitting: Internal Medicine

## 2024-02-16 ENCOUNTER — Ambulatory Visit

## 2024-02-16 DIAGNOSIS — F5101 Primary insomnia: Secondary | ICD-10-CM | POA: Diagnosis not present

## 2024-02-16 DIAGNOSIS — N183 Chronic kidney disease, stage 3 unspecified: Secondary | ICD-10-CM | POA: Diagnosis not present

## 2024-02-16 DIAGNOSIS — R911 Solitary pulmonary nodule: Secondary | ICD-10-CM

## 2024-02-16 DIAGNOSIS — I5189 Other ill-defined heart diseases: Secondary | ICD-10-CM | POA: Diagnosis not present

## 2024-02-18 ENCOUNTER — Ambulatory Visit
Admission: RE | Admit: 2024-02-18 | Discharge: 2024-02-18 | Disposition: A | Discharge status: Home / Self Care | Source: Ambulatory Visit | Attending: Internal Medicine | Admitting: Internal Medicine

## 2024-02-18 DIAGNOSIS — Z1231 Encounter for screening mammogram for malignant neoplasm of breast: Secondary | ICD-10-CM | POA: Diagnosis not present

## 2024-02-18 DIAGNOSIS — Z0000 Encounter for general adult medical examination without abnormal findings: Secondary | ICD-10-CM

## 2024-02-22 ENCOUNTER — Ambulatory Visit: Admitting: Physical Therapy

## 2024-02-26 ENCOUNTER — Other Ambulatory Visit: Payer: Self-pay

## 2024-02-26 ENCOUNTER — Ambulatory Visit: Attending: Orthopaedic Surgery | Admitting: Physical Therapy

## 2024-02-26 ENCOUNTER — Encounter: Payer: Self-pay | Admitting: Physical Therapy

## 2024-02-26 DIAGNOSIS — M25551 Pain in right hip: Secondary | ICD-10-CM | POA: Insufficient documentation

## 2024-02-26 DIAGNOSIS — M6281 Muscle weakness (generalized): Secondary | ICD-10-CM | POA: Insufficient documentation

## 2024-02-26 DIAGNOSIS — R2689 Other abnormalities of gait and mobility: Secondary | ICD-10-CM | POA: Insufficient documentation

## 2024-02-26 NOTE — Therapy (Addendum)
 OUTPATIENT PHYSICAL THERAPY LOWER EXTREMITY EVALUATION  Patient Name: Taylor Edwards MRN: [id number] DOB:1940-11-26, 83 y.o., female Today's Date: 02/26/2024   PT End of Session - 02/26/24 1151     Visit Number 1    Number of Visits --   1-2x/week   Date for PT Re-Evaluation 04/22/24    Authorization Type [employer] MCR - LEFS    Progress Note Due on Visit 10    PT Start Time 1102    PT Stop Time 1146    PT Time Calculation (min) 44 min             Past Medical History:  Diagnosis Date   Anemia    no current med.   Arthritis    knees   Diabetes mellitus    GERD (gastroesophageal reflux disease)    no current med.   Gout    Hypercholesteremia    Hypertension    has been on med. x 10 yrs.   Hypothyroid    IBS (irritable bowel syndrome)    Post-nasal drip    current cough   Sickle cell trait (HCC)    Urinary urgency    Past Surgical History:  Procedure Laterality Date   ABDOMINAL HYSTERECTOMY  1995   BUNIONECTOMY  08/25/2011   Procedure: Tillman Folks;  Surgeon: Alphonzo Ask, MD;  Location: [facility];  Service: Orthopedics;  Laterality: Right;   BUNIONECTOMY Left 08/04/2022   Procedure: LEFT FOOT BUNIONECTOMY;  Surgeon: Dayne Even, MD;  Location: [facility];  Service: Orthopedics;  Laterality: Left;   CATARACT EXTRACTION  2016   both eyes   COLONOSCOPY W/ BIOPSIES AND POLYPECTOMY     DILATION AND CURETTAGE OF UTERUS     INJECTION KNEE  08/25/2011   Procedure: KNEE INJECTION;  Surgeon: Alphonzo Ask, MD;  Location: [facility];  Service: Orthopedics;  Laterality: Bilateral;   KNEE ARTHROSCOPY  12/11/2008   bilat., with chondroplasty   KNEE JOINT MANIPULATION  10/30/2009   bilat.   OPEN REDUCTION INTERNAL FIXATION (ORIF) DISTAL RADIAL FRACTURE Left 11/17/2014   Procedure: OPEN REDUCTION INTERNAL FIXATION (ORIF) LEFT DISTAL RADIUS  FRACTURE;  Surgeon: Florida Hurter, MD;  Location: [facility];  Service: Orthopedics;  Laterality: Left;    SHOULDER ARTHROSCOPY WITH ROTATOR CUFF REPAIR AND SUBACROMIAL DECOMPRESSION Right 12/15/2022   Procedure: SHOULDER ARTHROSCOPY WITH ROTATOR CUFF REPAIR AND SUBACROMIAL DECOMPRESSION;  Surgeon: Sammye Cristal, MD;  Location: [facility];  Service: Orthopedics;  Laterality: Right;   TOTAL KNEE ARTHROPLASTY Bilateral 09/26/2009   bilat.   Patient Active Problem List   Diagnosis Date Noted   Posterior vitreous detachment, both eyes 03/26/2022   Diabetes mellitus without complication (HCC) 03/26/2022   Pseudophakia, both eyes 03/26/2022   History of total knee replacement, left 04/03/2019   History of total knee arthroplasty, right 04/03/2019   DERMATOFIBROMA 10/26/2007   ANEMIA, CHRONIC 10/26/2007   SICKLE CELL TRAIT 10/26/2007   ESSENTIAL HYPERTENSION 10/26/2007   GASTROESOPHAGEAL REFLUX DISEASE 10/26/2007   GASTRITIS 10/26/2007   HIATAL HERNIA 10/26/2007   CONSTIPATION, CHRONIC 10/26/2007   GUAIAC POSITIVE STOOL 10/26/2007    PCP: Benedetta Bradley, MD  REFERRING PROVIDER: Dayne Even, MD  THERAPY DIAG:  Pain in right hip  Muscle weakness  Other abnormalities of gait and mobility  REFERRING DIAG: lateral hip pain  Rationale for Evaluation and Treatment:  Rehabilitation  SUBJECTIVE:  PERTINENT PAST HISTORY:  Anemia, bil TKA, DM, R R/C repair  PRECAUTIONS: None  WEIGHT BEARING RESTRICTIONS No  FALLS:  Has patient fallen in last 6 months? Yes, Number of falls: 1 - March 9th, stumbled on a pipe in the sidewalk  MOI/History of condition:  Onset date: March 9th  SUBJECTIVE STATEMENT  SAGAL GAYTON is a 83 y.o. female who presents to clinic with chief complaint of R hip and LE pain which started after she stumbled over a pipe in the sidewalk and fell down when going into a store.  Since this time she has had R lateral and posterior hip pain which she reports can radiate below the knee.  The pain increases with standing and walking.  She  was told that she had an exacerbation of R hip bursitis but she feels it may be more of a sciatic pain.  She has had imaging of everything including at least R hip and knee which were all clear for fracture.  She reports some intermittent n/t in bil toes but she is not positive if this is new or not.   Red flags:  denies BB changes and saddle anesthesia  Pain:  Are you having pain? 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  Assistive Device: SPC as needed  Patient Goals/Specific Activities: reduce pain and improve ability to walk for longer periods   OBJECTIVE:   DIAGNOSTIC FINDINGS:  Pt reports x-ray completed for low back, R hip, and knees were clear  GENERAL OBSERVATION/GAIT: Slow antalgic gait with reduced time in stance on R  SENSATION: Light touch: Appears intact subjective report of n/t in bil toes at times  PALPATION: Significant TTP of R GT  MUSCLE LENGTH: Hamstrings: Right no restriction; Left no restriction  LE MMT:  MMT Right (Eval) Left (Eval)  Hip flexion (L2, L3) 3+* 4  Knee extension (L3)    Knee flexion    Hip abduction 3+* 4  Hip extension    Hip external rotation 4 4+  Hip internal rotation 4 4+  Hip adduction    Ankle dorsiflexion (L4)    Ankle plantarflexion (S1)    Ankle inversion    Ankle eversion    Great Toe ext (L5)    Grossly     (Blank rows = not tested, score listed is out of 5 possible points.  N = WNL, D = diminished, C = clear for gross weakness with myotome testing, * = concordant pain with testing)  LE ROM:  ROM Right (Eval) Left (Eval)  Hip flexion 90 90  Hip extension    Hip abduction    Hip adduction    Hip internal rotation n n  Hip external rotation n n  Knee  extension    Knee flexion    Ankle dorsiflexion    Ankle plantarflexion    Ankle inversion    Ankle eversion     (Blank rows = not tested, N = WNL, * = concordant pain with testing)  SPECIAL TESTS:  FADIR: (~) resisted  FABER: (+) R lateral hip Slump (-) bil SLR (+) R (-) L  PATIENT SURVEYS:  LEFS: 21/80  Functional Tests  Eval    2 MWT 265'  TODAY'S TREATMENT:  Therapeutic Exercise: Creating, reviewing, and completing below HEP   PATIENT EDUCATION ([place]/[facility]):  POC, diagnosis, prognosis, HEP, and outcome measures.  Pt educated via explanation, demonstration, and handout (HEP).  Pt confirms understanding verbally.   HOME EXERCISE PROGRAM: Access Code: [email or web address] URL: [email or web address] Date: 02/26/2024 Prepared by: Lesleigh Rash  Exercises - Hooklying Isometric Hip Abduction with Belt  - 1 x daily - 7 x weekly - 1-2 sets - 10 reps - Side Stepping with Counter Support  - 1 x daily - 7 x weekly - 2 sets - 10 reps  Treatment priorities   Eval        Progressive lateral hip loading         gait        endurance        Encouragement to use SPC                  ASSESSMENT:  CLINICAL IMPRESSION: Taylor Edwards is a 83 y.o. female who presents to clinic with signs and sxs consistent with R lateral hip pain following fall on 3/9.  No imaging available in epic, but pt reports she has had x-rays of her lumbar spine, R knee, and R hip with no fracture or other significant findings.  Today's exam is consistent with R greater trochanteric pain syndrome.  She has (-) slump but reproduction of back and LE pain with SLR on R so unable to rule out some element of nerve root irritation.  Encouraged her to use a cane from both a safety and de-loading stand point; she is hesitant but states she will.  Alissah will benefit from skilled PT to address relevant deficits and improve safety and comfort with daily  tasks involving standing and ambulation including work.  OBJECTIVE IMPAIRMENTS: Pain, R hip ROM, R hip strength, gait, balance  ACTIVITY LIMITATIONS: walking, standing, bending, squatting, pain at work which requires walking up to 100 yards  PERSONAL FACTORS: See medical history and pertinent history   REHAB POTENTIAL: Good  CLINICAL DECISION MAKING: Evolving/moderate complexity  EVALUATION COMPLEXITY: Moderate   GOALS:   SHORT TERM GOALS: Target date: 03/25/2024   Curtistine will be >75% HEP compliant to improve carryover between sessions and facilitate independent management of condition  Evaluation: ongoing Goal status: INITIAL   LONG TERM GOALS: Target date: 04/22/2024   Lavella will self report >/= 50% decrease in pain from evaluation to improve function in daily tasks  Evaluation/Baseline: 10/10 max pain Goal status: INITIAL   2.  Phoebe will show a >/= 30 pt improvement in LEFS score (MCID is ~11% or 9 pts) as a proxy for functional improvement   Evaluation/Baseline: 21/80 pts Goal status: INITIAL   3.  Delylah will be able to walk up to 100 yards for her job, not limited by pain  Evaluation/Baseline: limited Goal status: INITIAL   4.  Deriona will improve two minute walk test to 325 feet (MCID 40 ft).  Evaluation/Baseline: 265 ft Goal status: INITIAL   PLAN: PT FREQUENCY: 1-2x/week  PT DURATION: 8 weeks  PLANNED INTERVENTIONS:  97164- PT Re-evaluation, 97110-Therapeutic exercises, 97530- Therapeutic activity, [id number]- Neuromuscular re-education, 97535- Self Care, 16109- Manual therapy, [id number]- Gait training, [id number]- Aquatic Therapy, [phone]- Electrical stimulation (manual), [id number]- Vasopneumatic device, [id number]- Traction (mechanical), [id number]- Ionotophoresis 4mg /ml Dexamethasone , Taping, Dry Needling, Joint manipulation, and Spinal manipulation.   Lesleigh Rash PT, DPT 02/26/2024, 11:52 AM  Date of referral: 4/12 Referring provider: Ana Balling Referring diagnosis? R  hip greater trochanteric bursitis Treatment  diagnosis? (if different than referring diagnosis)     What was this (referring dx) caused by? Hercules Lombard of Condition: Initial Onset (within last 3 months)   Laterality: Rt  Current Functional Measure Score: LEFS 21/80  Objective measurements identify impairments when they are compared to normal values, the uninvolved extremity, and prior level of function.  [x]  Yes  []  No  Objective assessment of functional ability: Severe functional limitations   Briefly describe symptoms: Severe R hip pain  How did symptoms start: Fall  Average pain intensity:  Last 24 hours: 8  Past week: 8  How often does the pt experience symptoms? Frequently  How much have the symptoms interfered with usual daily activities? Quite a bit  How has condition changed since care began at this facility? NA - initial visit  In general, how is the patients overall health? Good   BACK PAIN (STarT Back Screening Tool) No

## 2024-02-29 ENCOUNTER — Ambulatory Visit
Admission: RE | Admit: 2024-02-29 | Discharge: 2024-02-29 | Disposition: A | Discharge status: Home / Self Care | Source: Ambulatory Visit | Attending: Internal Medicine | Admitting: Internal Medicine

## 2024-02-29 DIAGNOSIS — R911 Solitary pulmonary nodule: Secondary | ICD-10-CM

## 2024-02-29 DIAGNOSIS — K8020 Calculus of gallbladder without cholecystitis without obstruction: Secondary | ICD-10-CM | POA: Diagnosis not present

## 2024-02-29 DIAGNOSIS — R918 Other nonspecific abnormal finding of lung field: Secondary | ICD-10-CM | POA: Diagnosis not present

## 2024-02-29 DIAGNOSIS — I700 Atherosclerosis of aorta: Secondary | ICD-10-CM | POA: Diagnosis not present

## 2024-03-02 DIAGNOSIS — R82998 Other abnormal findings in urine: Secondary | ICD-10-CM | POA: Diagnosis not present

## 2024-03-04 ENCOUNTER — Encounter: Payer: Self-pay | Admitting: Physical Therapy

## 2024-03-04 ENCOUNTER — Ambulatory Visit: Admitting: Physical Therapy

## 2024-03-04 DIAGNOSIS — R2689 Other abnormalities of gait and mobility: Secondary | ICD-10-CM

## 2024-03-04 DIAGNOSIS — M6281 Muscle weakness (generalized): Secondary | ICD-10-CM | POA: Diagnosis not present

## 2024-03-04 DIAGNOSIS — M25551 Pain in right hip: Secondary | ICD-10-CM | POA: Diagnosis not present

## 2024-03-04 NOTE — Therapy (Signed)
 OUTPATIENT PHYSICAL THERAPY DAILY NOTE  Patient Name: Taylor Edwards MRN: [id number] DOB:[birth year], 83 y.o., female Today's Date: 03/04/2024   PT End of Session - 03/04/24 1010     Visit Number 2    Number of Visits --   1-2x/week   Date for PT Re-Evaluation 04/22/24    Authorization Type [employer] MCR - LEFS    Progress Note Due on Visit 10    PT Start Time 1015    PT Stop Time 1056    PT Time Calculation (min) 41 min          Past Medical History:  Diagnosis Date   Anemia    no current med.   Arthritis    knees   Diabetes mellitus    GERD (gastroesophageal reflux disease)    no current med.   Gout    Hypercholesteremia    Hypertension    has been on med. x 10 yrs.   Hypothyroid    IBS (irritable bowel syndrome)    Post-nasal drip    current cough   Sickle cell trait (HCC)    Urinary urgency    Past Surgical History:  Procedure Laterality Date   ABDOMINAL HYSTERECTOMY  1995   BUNIONECTOMY  08/25/2011   Procedure: Taylor Edwards;  Surgeon: Alphonzo Ask, MD;  Location: [facility];  Service: Orthopedics;  Laterality: Right;   BUNIONECTOMY Left 08/04/2022   Procedure: LEFT FOOT BUNIONECTOMY;  Surgeon: Dayne Even, MD;  Location: [facility];  Service: Orthopedics;  Laterality: Left;   CATARACT EXTRACTION  2016   both eyes   COLONOSCOPY W/ BIOPSIES AND POLYPECTOMY     DILATION AND CURETTAGE OF UTERUS     INJECTION KNEE  08/25/2011   Procedure: KNEE INJECTION;  Surgeon: Alphonzo Ask, MD;  Location: [facility];  Service: Orthopedics;  Laterality: Bilateral;   KNEE ARTHROSCOPY  12/11/2008   bilat., with chondroplasty   KNEE JOINT MANIPULATION  10/30/2009   bilat.   OPEN REDUCTION INTERNAL FIXATION (ORIF) DISTAL RADIAL FRACTURE Left 11/17/2014   Procedure: OPEN REDUCTION INTERNAL FIXATION (ORIF) LEFT DISTAL RADIUS  FRACTURE;  Surgeon: Florida Hurter, MD;  Location: [facility];  Service: Orthopedics;  Laterality: Left;   SHOULDER  ARTHROSCOPY WITH ROTATOR CUFF REPAIR AND SUBACROMIAL DECOMPRESSION Right 12/15/2022   Procedure: SHOULDER ARTHROSCOPY WITH ROTATOR CUFF REPAIR AND SUBACROMIAL DECOMPRESSION;  Surgeon: Sammye Cristal, MD;  Location:  SUR[facility]ERY [facility];  Service: Orthopedics;  Laterality: Right;   TOTAL KNEE ARTHROPLASTY Bilateral 09/26/2009   bilat.   Patient Active Problem List   Diagnosis Date Noted   Posterior vitreous detachment, both eyes 03/26/2022   Diabetes mellitus without complication (HCC) 03/26/2022   Pseudophakia, both eyes 03/26/2022   History of total knee replacement, left 04/03/2019   History of total knee arthroplasty, right 04/03/2019   DERMATOFIBROMA 10/26/2007   ANEMIA, CHRONIC 10/26/2007   SICKLE CELL TRAIT 10/26/2007   ESSENTIAL HYPERTENSION 10/26/2007   GASTROESOPHAGEAL REFLUX DISEASE 10/26/2007   GASTRITIS 10/26/2007   HIATAL HERNIA 10/26/2007   CONSTIPATION, CHRONIC 10/26/2007   GUAIAC POSITIVE STOOL 10/26/2007    PCP: Benedetta Bradley, MD  REFERRING PROVIDER: Dayne Even, MD  THERAPY DIAG:  Pain in right hip  Muscle weakness  Other abnormalities of gait and mobility  REFERRING DIAG: lateral hip pain  Rationale for Evaluation and Treatment:  Rehabilitation  SUBJECTIVE:  PERTINENT PAST HISTORY:  Anemia, bil TKA, DM, R R/C repair      PRECAUTIONS: None  WEIGHT  BEARING RESTRICTIONS No  FALLS:  Has patient fallen in last 6 months? Yes, Number of falls: 1 - March 9th, stumbled on a pipe in the sidewalk  MOI/History of condition:  Onset date: March 9th  SUBJECTIVE STATEMENT  03/04/2024:  Pt reports that she has been completing her exercises at home which she finds helpful.  She worked more last night and has more pain today.  EVAL: Taylor Edwards is a 83 y.o. female who presents to clinic with chief complaint of R hip and LE pain which started after she stumbled over a pipe in the sidewalk and fell down when going into a store.  Since this  time she has had R lateral and posterior hip pain which she reports can radiate below the knee.  The pain increases with standing and walking.  She was told that she had an exacerbation of R hip bursitis but she feels it may be more of a sciatic pain.  She has had imaging of everything including at least R hip and knee which were all clear for fracture.  She reports some intermittent n/t in bil toes but she is not positive if this is new or not.   Red flags:  denies BB changes and saddle anesthesia  Pain:  Are you having pain? 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  Assistive Device: SPC as needed  Patient Goals/Specific Activities: reduce pain and improve ability to walk for longer periods   OBJECTIVE:   DIAGNOSTIC FINDINGS:  Pt reports x-ray completed for low back, R hip, and knees were clear  GENERAL OBSERVATION/GAIT: Slow antalgic gait with reduced time in stance on R  SENSATION: Light touch: Appears intact subjective report of n/t in bil toes at times  PALPATION: Significant TTP of R GT  MUSCLE LENGTH: Hamstrings: Right no restriction; Left no restriction  LE MMT:  MMT Right (Eval) Left (Eval)  Hip flexion (L2, L3) 3+* 4  Knee extension (L3)    Knee flexion    Hip abduction 3+* 4  Hip extension    Hip external rotation 4 4+  Hip internal rotation 4 4+  Hip adduction    Ankle dorsiflexion (L4)    Ankle plantarflexion (S1)    Ankle inversion    Ankle eversion    Great Toe ext (L5)    Grossly     (Blank rows = not tested, score listed is out of 5 possible points.  N = WNL, D = diminished, C = clear for gross weakness with myotome testing, * = concordant pain with testing)  LE ROM:  ROM Right (Eval)  Left (Eval)  Hip flexion 90 90  Hip extension    Hip abduction    Hip adduction    Hip internal rotation n n  Hip external rotation n n  Knee extension    Knee flexion    Ankle dorsiflexion    Ankle plantarflexion    Ankle inversion    Ankle eversion     (Blank rows = not tested, N = WNL, * = concordant pain with testing)  SPECIAL TESTS:  FADIR: (~) resisted  FABER: (+) R lateral hip Slump (-) bil SLR (+) R (-) L  PATIENT SURVEYS:  LEFS: 21/80  Functional Tests  Eval  2 MWT 265'                                                           TODAY'S TREATMENT:  [facility] Adult PT Treatment  03/04/2024:  Therapeutic Exercise: nu-step L5 60m while taking subjective and planning session with patient Static hip abd - supine - 2x10 - 10'' ea Static hip abd in standing - 2x10 - 10'' ea Slow bridge - 10x Hip abd slider - RTB at ankle - 10x ea - slow     HOME EXERCISE PROGRAM: Access Code: [email or web address] URL: [email or web address] Date: 03/04/2024 Prepared by: Lesleigh Rash  Exercises - Hooklying Isometric Hip Abduction with Belt  - 1 x daily - 7 x weekly - 1-2 sets - 10 reps - Side Stepping with Counter Support  - 1 x daily - 7 x weekly - 2 sets - 10 reps - Supine Bridge  - 1 x daily - 7 x weekly - 1 sets - 10 reps - 5 seconds hold - Squat with Counter Support  - 1 x daily - 7 x weekly - 1-2 sets - 10 reps - 2-3 seconds hold  Treatment priorities   Eval        Progressive lateral hip loading         gait        endurance        Encouragement to use SPC                  ASSESSMENT:  CLINICAL IMPRESSION:  03/04/2024:  Stana Ear tolerated session well with no adverse reaction.  Progressing well and able to tolerate bridge and squat today which was very painful on eval.  Updated HEP.  EVAL: Shanvi is a 83 y.o. female who presents to clinic with signs and sxs consistent with R lateral hip pain following fall on 3/9.  No imaging available in epic,  but pt reports she has had x-rays of her lumbar spine, R knee, and R hip with no fracture or other significant findings.  Today's exam is consistent with R greater trochanteric pain syndrome.  She has (-) slump but reproduction of back and LE pain with SLR on R so unable to rule out some element of nerve root irritation.  Encouraged her to use a cane from both a safety and de-loading stand point; she is hesitant but states she will.  Aanya will benefit from skilled PT to address relevant deficits and improve safety and comfort with daily tasks involving standing and ambulation including work.  OBJECTIVE IMPAIRMENTS: Pain, R hip ROM, R hip strength, gait, balance  ACTIVITY LIMITATIONS: walking, standing, bending, squatting, pain at work which requires walking up to 100 yards  PERSONAL FACTORS: See medical history and pertinent history   REHAB POTENTIAL: Good  CLINICAL DECISION MAKING: Evolving/moderate complexity  EVALUATION COMPLEXITY: Moderate   GOALS:   SHORT TERM GOALS: Target date: 03/25/2024   Zamoria will be >75% HEP compliant to improve carryover between sessions and facilitate independent management of condition  Evaluation: ongoing Goal status: INITIAL   LONG TERM GOALS: Target date: 04/22/2024   Miu will self report >/= 50% decrease in pain from evaluation to improve function in daily tasks  Evaluation/Baseline: 10/10 max pain Goal status: INITIAL   2.  Patrecia will show a >/=  30 pt improvement in LEFS score (MCID is ~11% or 9 pts) as a proxy for functional improvement   Evaluation/Baseline: 21/80 pts Goal status: INITIAL   3.  Zya will be able to walk up to 100 yards for her job, not limited by pain  Evaluation/Baseline: limited Goal status: INITIAL   4.  Zira will improve two minute walk test to 325 feet (MCID 40 ft).  Evaluation/Baseline: 265 ft Goal status: INITIAL   PLAN: PT FREQUENCY: 1-2x/week  PT DURATION: 8 weeks  PLANNED INTERVENTIONS:   97164- PT Re-evaluation, 97110-Therapeutic exercises, 97530- Therapeutic activity, [id number]- Neuromuscular re-education, 97535- Self Care, 16109- Manual therapy, [id number]- Gait training, [id number]- Aquatic Therapy, [phone]- Electrical stimulation (manual), [id number]- Vasopneumatic device, [id number]- Traction (mechanical), [id number]- Ionotophoresis 4mg /ml Dexamethasone , Taping, Dry Needling, Joint manipulation, and Spinal manipulation.   Chloey Ricard PT, DPT 03/04/2024, 11:03 AM

## 2024-03-14 NOTE — Therapy (Unsigned)
 OUTPATIENT PHYSICAL THERAPY DAILY NOTE  Patient Name: Taylor Edwards MRN: [id number] DOB:1941/05/03, 83 y.o., female Today's Date: 03/16/2024   PT End of Session - 03/16/24 1131     Visit Number 3    Number of Visits --   1-2x/week   Date for PT Re-Evaluation 04/22/24    Authorization Type [employer] MCR - LEFS    Progress Note Due on Visit 10    PT Start Time 1130    PT Stop Time 1210    PT Time Calculation (min) 40 min    Activity Tolerance Patient tolerated treatment well    Behavior During Therapy WFL for tasks assessed/performed           Past Medical History:  Diagnosis Date   Anemia    no current med.   Arthritis    knees   Diabetes mellitus    GERD (gastroesophageal reflux disease)    no current med.   Gout    Hypercholesteremia    Hypertension    has been on med. x 10 yrs.   Hypothyroid    IBS (irritable bowel syndrome)    Post-nasal drip    current cough   Sickle cell trait (HCC)    Urinary urgency    Past Surgical History:  Procedure Laterality Date   ABDOMINAL HYSTERECTOMY  1995   BUNIONECTOMY  08/25/2011   Procedure: [name];  Surgeon: Taylor KANDICE Herald, MD;  Location: [facility];  Service: Orthopedics;  Laterality: Right;   BUNIONECTOMY Left 08/04/2022   Procedure: LEFT FOOT BUNIONECTOMY;  Surgeon: Edwards Maude, MD;  Location: [facility];  Service: Orthopedics;  Laterality: Left;   CATARACT EXTRACTION  2016   both eyes   COLONOSCOPY W/ BIOPSIES AND POLYPECTOMY     DILATION AND CURETTAGE OF UTERUS     INJECTION KNEE  08/25/2011   Procedure: KNEE INJECTION;  Surgeon: Taylor KANDICE Herald, MD;  Location: [facility];  Service: Orthopedics;  Laterality: Bilateral;   KNEE ARTHROSCOPY  12/11/2008   bilat., with chondroplasty   KNEE JOINT MANIPULATION  10/30/2009   bilat.   OPEN REDUCTION INTERNAL FIXATION (ORIF) DISTAL RADIAL FRACTURE Left 11/17/2014   Procedure: OPEN REDUCTION INTERNAL FIXATION (ORIF) LEFT DISTAL RADIUS   FRACTURE;  Surgeon: Taylor Robinsons, MD;  Location: [facility];  Service: Orthopedics;  Laterality: Left;   SHOULDER ARTHROSCOPY WITH ROTATOR CUFF REPAIR AND SUBACROMIAL DECOMPRESSION Right 12/15/2022   Procedure: SHOULDER ARTHROSCOPY WITH ROTATOR CUFF REPAIR AND SUBACROMIAL DECOMPRESSION;  Surgeon: Taylor Soulier, MD;  Location: [facility];  Service: Orthopedics;  Laterality: Right;   TOTAL KNEE ARTHROPLASTY Bilateral 09/26/2009   bilat.   Patient Active Problem List   Diagnosis Date Noted   Posterior vitreous detachment, both eyes 03/26/2022   Diabetes mellitus without complication (HCC) 03/26/2022   Pseudophakia, both eyes 03/26/2022   History of total knee replacement, left 04/03/2019   History of total knee arthroplasty, right 04/03/2019   DERMATOFIBROMA 10/26/2007   ANEMIA, CHRONIC 10/26/2007   SICKLE CELL TRAIT 10/26/2007   ESSENTIAL HYPERTENSION 10/26/2007   GASTROESOPHAGEAL REFLUX DISEASE 10/26/2007   GASTRITIS 10/26/2007   HIATAL HERNIA 10/26/2007   CONSTIPATION, CHRONIC 10/26/2007   GUAIAC POSITIVE STOOL 10/26/2007    PCP: Taylor Dorn LABOR, MD  REFERRING PROVIDER: Charlott Dorn Edwards, *  THERAPY DIAG:  Pain in right hip  Muscle weakness  Other abnormalities of gait and mobility  REFERRING DIAG: lateral hip pain  Rationale for Evaluation and Treatment:  Rehabilitation  SUBJECTIVE:  PERTINENT PAST HISTORY:  Anemia, bil TKA, DM, R R/C repair      PRECAUTIONS: None  WEIGHT BEARING RESTRICTIONS No  FALLS:  Has patient fallen in last 6 months? Yes, Number of falls: 1 - March 9th, stumbled on Edwards pipe in the sidewalk  MOI/History of condition:  Onset date: March 9th  SUBJECTIVE STATEMENT Arrives to PT with c/o of dizziness as well as R hip pain.  Unable to lie on R side.   Had an appointment with pain management but cancelled and will not reschedule.  Currently 2/10 at rest but 9-10/10.  Symptoms worse with prolonged walking.   Symptoms  relieved with rest and Tylenol .  EVAL: Taylor Edwards is Edwards 83 y.o. female who presents to clinic with chief complaint of R hip and LE pain which started after she stumbled over Edwards pipe in the sidewalk and fell down when going into Edwards store.  Since this time she has had R lateral and posterior hip pain which she reports can radiate below the knee.  The pain increases with standing and walking.  She was told that she had an exacerbation of R hip bursitis but she feels it may be more of Edwards sciatic pain.  She has had imaging of everything including at least R hip and knee which were all clear for fracture.  She reports some intermittent n/t in bil toes but she is not positive if this is new or not.   Red flags:  denies BB changes and saddle anesthesia  Pain:  Are you having pain? 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  Assistive Device: SPC as needed  Patient Goals/Specific Activities: reduce pain and improve ability to walk for longer periods   OBJECTIVE:   DIAGNOSTIC FINDINGS:  Pt reports x-ray completed for low back, R hip, and knees were clear  GENERAL OBSERVATION/GAIT: Slow antalgic gait with reduced time in stance on R  SENSATION: Light touch: Appears intact subjective report of n/t in bil toes at times  PALPATION: Significant TTP of R GT  MUSCLE LENGTH: Hamstrings: Right no restriction; Left no restriction  LE MMT:  MMT Right (Eval) Left (Eval)  Hip flexion (L2, L3) 3+* 4  Knee extension (L3)    Knee flexion    Hip abduction 3+* 4  Hip extension    Hip external rotation 4 4+  Hip internal rotation 4 4+  Hip adduction    Ankle dorsiflexion (L4)    Ankle plantarflexion (S1)    Ankle inversion    Ankle eversion     Great Toe ext (L5)    Grossly     (Blank rows = not tested, score listed is out of 5 possible points.  N = WNL, D = diminished, C = clear for gross weakness with myotome testing, * = concordant pain with testing)  LE ROM:  ROM Right (Eval) Left (Eval)  Hip flexion 90 90  Hip extension    Hip abduction    Hip adduction    Hip internal rotation n n  Hip external rotation n n  Knee extension    Knee flexion    Ankle dorsiflexion    Ankle plantarflexion    Ankle inversion    Ankle eversion     (Blank rows =  not tested, N = WNL, * = concordant pain with testing)  SPECIAL TESTS:  FADIR: (~) resisted  FABER: (+) R lateral hip Slump (-) bil SLR (+) R (-) L  PATIENT SURVEYS:  LEFS: 21/80  Functional Tests  Eval    2 MWT 265'                                                           TODAY'S TREATMENT: [facility] Adult PT Treatment:                                                DATE: 03/16/24 Therapeutic Exercise: Nustep L4 8 min Neuromuscular re-ed: Supine hip fallouts RTB 15x B, 15/15 unilaterally Bridge against RTB 15x FAQs with adduction 15x P-ball curl ups 15x B, 15/15 unilaterally Therapeutic Activity: Seated hamstring stretch 30s x2 Supine QL stretch 30s x2  [facility] Adult PT Treatment  03/04/2024:  Therapeutic Exercise: nu-step L5 59m while taking subjective and planning session with patient Static hip abd - supine - 2x10 - 10'' ea Static hip abd in standing - 2x10 - 10'' ea Slow bridge - 10x Hip abd slider - RTB at ankle - 10x ea - slow     HOME EXERCISE PROGRAM: Access Code: [email or web address] URL: [email or web address] Date: 03/04/2024 Prepared by: Helene Gasmen  Exercises - Hooklying Isometric Hip Abduction with Belt  - 1 x daily - 7 x weekly - 1-2 sets - 10 reps - Side Stepping with Counter Support  - 1 x daily - 7 x weekly - 2 sets - 10 reps - Supine Bridge  - 1 x daily - 7 x weekly - 1 sets - 10 reps - 5 seconds hold - Squat  with Counter Support  - 1 x daily - 7 x weekly - 1-2 sets - 10 reps - 2-3 seconds hold  Treatment priorities   Eval        Progressive lateral hip loading         gait        endurance        Encouragement to use SPC                  ASSESSMENT:  CLINICAL IMPRESSION: Focus of session was aerobic w/u f/b stretching and hip strengthening and stabilization tasks.  Added resisted hip abduction tasks and incorporated abdominal and core work.  EVAL: Shyne is Edwards 83 y.o. female who presents to clinic with signs and sxs consistent with R lateral hip pain following fall on 3/9.  No imaging available in epic, but pt reports she has had x-rays of her lumbar spine, R knee, and R hip with no fracture or other significant findings.  Today's exam is consistent with R greater trochanteric pain syndrome.  She has (-) slump but reproduction of back and LE pain with SLR on R so unable to rule out some element of nerve root irritation.  Encouraged her to use Edwards cane from both Edwards safety and de-loading stand point; she is hesitant but states she will.  Satina will benefit from skilled PT to address relevant deficits and improve safety and comfort with daily tasks involving standing  and ambulation including work.  OBJECTIVE IMPAIRMENTS: Pain, R hip ROM, R hip strength, gait, balance  ACTIVITY LIMITATIONS: walking, standing, bending, squatting, pain at work which requires walking up to 100 yards  PERSONAL FACTORS: See medical history and pertinent history   REHAB POTENTIAL: Good  CLINICAL DECISION MAKING: Evolving/moderate complexity  EVALUATION COMPLEXITY: Moderate   GOALS:   SHORT TERM GOALS: Target date: 03/25/2024   Shaquasha will be >75% HEP compliant to improve carryover between sessions and facilitate independent management of condition  Evaluation: ongoing Goal status: INITIAL   LONG TERM GOALS: Target date: 04/22/2024   Nakyra will self report >/= 50% decrease in pain from evaluation to improve  function in daily tasks  Evaluation/Baseline: 10/10 max pain Goal status: INITIAL   2.  Chloe will show Edwards >/= 30 pt improvement in LEFS score (MCID is ~11% or 9 pts) as Edwards proxy for functional improvement   Evaluation/Baseline: 21/80 pts Goal status: INITIAL   3.  Brehanna will be able to walk up to 100 yards for her job, not limited by pain  Evaluation/Baseline: limited Goal status: INITIAL   4.  Rexanna will improve two minute walk test to 325 feet (MCID 40 ft).  Evaluation/Baseline: 265 ft Goal status: INITIAL   PLAN: PT FREQUENCY: 1-2x/week  PT DURATION: 8 weeks  PLANNED INTERVENTIONS:  97164- PT Re-evaluation, 97110-Therapeutic exercises, 97530- Therapeutic activity, [id number]- Neuromuscular re-education, 97535- Self Care, [id number]- Manual therapy, [id number]- Gait training, [id number]- Aquatic Therapy, [phone]- Electrical stimulation (manual), [id number]- Vasopneumatic device, [id number]- Traction (mechanical), [id number]- Ionotophoresis 4mg /ml Dexamethasone , Taping, Dry Needling, Joint manipulation, and Spinal manipulation.   Jeff Alexandro Line PT  03/16/2024, 12:10 PM

## 2024-03-16 ENCOUNTER — Ambulatory Visit

## 2024-03-16 DIAGNOSIS — M25551 Pain in right hip: Secondary | ICD-10-CM

## 2024-03-16 DIAGNOSIS — M6281 Muscle weakness (generalized): Secondary | ICD-10-CM

## 2024-03-16 DIAGNOSIS — R2689 Other abnormalities of gait and mobility: Secondary | ICD-10-CM | POA: Diagnosis not present

## 2024-03-18 ENCOUNTER — Ambulatory Visit: Admitting: Physical Therapy

## 2024-03-28 NOTE — Therapy (Unsigned)
 OUTPATIENT PHYSICAL THERAPY DAILY NOTE  Patient Name: Taylor Edwards MRN: [id number] DOB:11-12-1940, 83 y.o., female Today's Date: 03/28/2024      Past Medical History:  Diagnosis Date   Anemia    no current med.   Arthritis    knees   Diabetes mellitus    GERD (gastroesophageal reflux disease)    no current med.   Gout    Hypercholesteremia    Hypertension    has been on med. x 10 yrs.   Hypothyroid    IBS (irritable bowel syndrome)    Post-nasal drip    current cough   Sickle cell trait (HCC)    Urinary urgency    Past Surgical History:  Procedure Laterality Date   ABDOMINAL HYSTERECTOMY  1995   BUNIONECTOMY  08/25/2011   Procedure: [name];  Surgeon: [name], MD;  Location: [facility];  Service: Orthopedics;  Laterality: Right;   BUNIONECTOMY Left 08/04/2022   Procedure: LEFT FOOT BUNIONECTOMY;  Surgeon: [name], MD;  Location: [facility];  Service: Orthopedics;  Laterality: Left;   CATARACT EXTRACTION  2016   both eyes   COLONOSCOPY W/ BIOPSIES AND POLYPECTOMY     DILATION AND CURETTAGE OF UTERUS     INJECTION KNEE  08/25/2011   Procedure: KNEE INJECTION;  Surgeon: [name], MD;  Location: [facility];  Service: Orthopedics;  Laterality: Bilateral;   KNEE ARTHROSCOPY  12/11/2008   bilat., with chondroplasty   KNEE JOINT MANIPULATION  10/30/2009   bilat.   OPEN REDUCTION INTERNAL FIXATION (ORIF) DISTAL RADIAL FRACTURE Left 11/17/2014   Procedure: OPEN REDUCTION INTERNAL FIXATION (ORIF) LEFT DISTAL RADIUS  FRACTURE;  Surgeon: [name], MD;  Location: [facility];  Service: Orthopedics;  Laterality: Left;   SHOULDER ARTHROSCOPY WITH ROTATOR CUFF REPAIR AND SUBACROMIAL DECOMPRESSION Right 12/15/2022   Procedure: SHOULDER ARTHROSCOPY WITH ROTATOR CUFF REPAIR AND SUBACROMIAL DECOMPRESSION;  Surgeon: [name], MD;  Location:  [facility];  Service: Orthopedics;  Laterality: Right;   TOTAL KNEE  ARTHROPLASTY Bilateral 09/26/2009   bilat.   Patient Active Problem List   Diagnosis Date Noted   Posterior vitreous detachment, both eyes 03/26/2022   Diabetes mellitus without complication (HCC) 03/26/2022   Pseudophakia, both eyes 03/26/2022   History of total knee replacement, left 04/03/2019   History of total knee arthroplasty, right 04/03/2019   DERMATOFIBROMA 10/26/2007   ANEMIA, CHRONIC 10/26/2007   SICKLE CELL TRAIT 10/26/2007   ESSENTIAL HYPERTENSION 10/26/2007   GASTROESOPHAGEAL REFLUX DISEASE 10/26/2007   GASTRITIS 10/26/2007   HIATAL HERNIA 10/26/2007   CONSTIPATION, CHRONIC 10/26/2007   GUAIAC POSITIVE STOOL 10/26/2007    PCP: Charlott Dorn LABOR, MD  REFERRING PROVIDER: [name], MD  THERAPY DIAG:  No diagnosis found.  REFERRING DIAG: lateral hip pain  Rationale for Evaluation and Treatment:  Rehabilitation  SUBJECTIVE:  PERTINENT PAST HISTORY:  Anemia, bil TKA, DM, R R/C repair      PRECAUTIONS: None  WEIGHT BEARING RESTRICTIONS No  FALLS:  Has patient fallen in last 6 months? Yes, Number of falls: 1 - March 9th, stumbled on a pipe in the sidewalk  MOI/History of condition:  Onset date: March 9th  SUBJECTIVE STATEMENT Arrives to PT with c/o of dizziness as well as R hip pain.  Unable to lie on R side.   Had an appointment with pain management but cancelled and will not reschedule.  Currently 2/10 at rest but 9-10/10.  Symptoms worse with prolonged walking.  Symptoms relieved with rest and Tylenol .  EVAL: Taylor Edwards is a 83 y.o. female who presents to clinic with chief complaint of R hip and LE pain which started after she stumbled over a pipe in the sidewalk and fell down when going into a store.  Since this time she has had R lateral and posterior hip pain which she reports can radiate below the knee.  The pain increases with standing and walking.  She was told that she had an exacerbation of R hip bursitis but she feels it may be  more of a sciatic pain.  She has had imaging of everything including at least R hip and knee which were all clear for fracture.  She reports some intermittent n/t in bil toes but she is not positive if this is new or not.   Red flags:  denies BB changes and saddle anesthesia  Pain:  Are you having pain? 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  Assistive Device: SPC as needed  Patient Goals/Specific Activities: reduce pain and improve ability to walk for longer periods   OBJECTIVE:   DIAGNOSTIC FINDINGS:  Pt reports x-ray completed for low back, R hip, and knees were clear  GENERAL OBSERVATION/GAIT: Slow antalgic gait with reduced time in stance on R  SENSATION: Light touch: Appears intact subjective report of n/t in bil toes at times  PALPATION: Significant TTP of R GT  MUSCLE LENGTH: Hamstrings: Right no restriction; Left no restriction  LE MMT:  MMT Right (Eval) Left (Eval)  Hip flexion (L2, L3) 3+* 4  Knee extension (L3)    Knee flexion    Hip abduction 3+* 4  Hip extension    Hip external rotation 4 4+  Hip internal rotation 4 4+  Hip adduction    Ankle dorsiflexion (L4)    Ankle plantarflexion (S1)    Ankle inversion    Ankle eversion    Great Toe ext (L5)    Grossly     (Blank rows = not tested, score listed is out of 5 possible points.  N = WNL, D = diminished, C = clear for gross weakness with myotome testing, * = concordant pain with testing)  LE ROM:  ROM Right (Eval) Left (Eval)  Hip flexion 90 90  Hip extension    Hip abduction    Hip adduction    Hip internal rotation n n  Hip external rotation n n  Knee extension    Knee flexion    Ankle dorsiflexion    Ankle plantarflexion     Ankle inversion    Ankle eversion     (Blank rows = not tested, N = WNL, * = concordant pain with testing)  SPECIAL TESTS:  FADIR: (~) resisted  FABER: (+) R lateral hip Slump (-) bil SLR (+) R (-) L  PATIENT SURVEYS:  LEFS: 21/80  Functional Tests  Eval    2 MWT 265'                                                           TODAY'S  TREATMENT: [facility] Adult PT Treatment:                                                DATE: 03/16/24 Therapeutic Exercise: Nustep L4 8 min Neuromuscular re-ed: Supine hip fallouts RTB 15x B, 15/15 unilaterally Bridge against RTB 15x FAQs with adduction 15x P-ball curl ups 15x B, 15/15 unilaterally Therapeutic Activity: Seated hamstring stretch 30s x2 Supine QL stretch 30s x2  [facility] Adult PT Treatment  03/04/2024:  Therapeutic Exercise: nu-step L5 65m while taking subjective and planning session with patient Static hip abd - supine - 2x10 - 10'' ea Static hip abd in standing - 2x10 - 10'' ea Slow bridge - 10x Hip abd slider - RTB at ankle - 10x ea - slow     HOME EXERCISE PROGRAM: Access Code: [email or web address] URL: [email or web address] Date: 03/04/2024 Prepared by: Helene Gasmen  Exercises - Hooklying Isometric Hip Abduction with Belt  - 1 x daily - 7 x weekly - 1-2 sets - 10 reps - Side Stepping with Counter Support  - 1 x daily - 7 x weekly - 2 sets - 10 reps - Supine Bridge  - 1 x daily - 7 x weekly - 1 sets - 10 reps - 5 seconds hold - Squat with Counter Support  - 1 x daily - 7 x weekly - 1-2 sets - 10 reps - 2-3 seconds hold  Treatment priorities   Eval        Progressive lateral hip loading         gait        endurance        Encouragement to use SPC                  ASSESSMENT:  CLINICAL IMPRESSION: Focus of session was aerobic w/u f/b stretching and hip strengthening and stabilization tasks.  Added resisted hip abduction tasks and incorporated abdominal and core work.  EVAL: Monica is a 83  y.o. female who presents to clinic with signs and sxs consistent with R lateral hip pain following fall on 3/9.  No imaging available in epic, but pt reports she has had x-rays of her lumbar spine, R knee, and R hip with no fracture or other significant findings.  Today's exam is consistent with R greater trochanteric pain syndrome.  She has (-) slump but reproduction of back and LE pain with SLR on R so unable to rule out some element of nerve root irritation.  Encouraged her to use a cane from both a safety and de-loading stand point; she is hesitant but states she will.  Alexei will benefit from skilled PT to address relevant deficits and improve safety and comfort with daily tasks involving standing and ambulation including work.  OBJECTIVE IMPAIRMENTS: Pain, R hip ROM, R hip strength, gait, balance  ACTIVITY LIMITATIONS: walking, standing, bending, squatting, pain at work which requires walking up to 100 yards  PERSONAL FACTORS: See medical history and pertinent history   REHAB POTENTIAL: Good  CLINICAL DECISION MAKING: Evolving/moderate complexity  EVALUATION COMPLEXITY: Moderate   GOALS:   SHORT TERM GOALS: Target date: 03/25/2024   Konstance will be >75% HEP compliant to improve carryover between sessions and facilitate independent management of condition  Evaluation: ongoing Goal status: INITIAL   LONG TERM GOALS: Target date: 04/22/2024   Chermaine will self report >/=  50% decrease in pain from evaluation to improve function in daily tasks  Evaluation/Baseline: 10/10 max pain Goal status: INITIAL   2.  Cheris will show a >/= 30 pt improvement in LEFS score (MCID is ~11% or 9 pts) as a proxy for functional improvement   Evaluation/Baseline: 21/80 pts Goal status: INITIAL   3.  Aashritha will be able to walk up to 100 yards for her job, not limited by pain  Evaluation/Baseline: limited Goal status: INITIAL   4.  Toshi will improve two minute walk test to 325 feet (MCID 40  ft).  Evaluation/Baseline: 265 ft Goal status: INITIAL   PLAN: PT FREQUENCY: 1-2x/week  PT DURATION: 8 weeks  PLANNED INTERVENTIONS:  97164- PT Re-evaluation, 97110-Therapeutic exercises, 97530- Therapeutic activity, [id number]- Neuromuscular re-education, 97535- Self Care, 02859- Manual therapy, [id number]- Gait training, [id number]- Aquatic Therapy, [phone]- Electrical stimulation (manual), [id number]- Vasopneumatic device, [id number]- Traction (mechanical), [id number]- Ionotophoresis 4mg /ml Dexamethasone , Taping, Dry Needling, Joint manipulation, and Spinal manipulation.   Jeff Montie Gelardi PT  03/28/2024, 1:11 PM

## 2024-03-30 ENCOUNTER — Ambulatory Visit: Attending: Orthopaedic Surgery

## 2024-03-30 DIAGNOSIS — R2689 Other abnormalities of gait and mobility: Secondary | ICD-10-CM | POA: Diagnosis not present

## 2024-03-30 DIAGNOSIS — M6281 Muscle weakness (generalized): Secondary | ICD-10-CM | POA: Diagnosis not present

## 2024-03-30 DIAGNOSIS — M25551 Pain in right hip: Secondary | ICD-10-CM | POA: Insufficient documentation

## 2024-04-04 DIAGNOSIS — M79672 Pain in left foot: Secondary | ICD-10-CM | POA: Diagnosis not present

## 2024-04-04 DIAGNOSIS — M545 Low back pain, unspecified: Secondary | ICD-10-CM | POA: Diagnosis not present

## 2024-04-05 ENCOUNTER — Other Ambulatory Visit: Payer: Self-pay | Admitting: Orthopaedic Surgery

## 2024-04-05 DIAGNOSIS — I129 Hypertensive chronic kidney disease with stage 1 through stage 4 chronic kidney disease, or unspecified chronic kidney disease: Secondary | ICD-10-CM | POA: Diagnosis not present

## 2024-04-05 DIAGNOSIS — E039 Hypothyroidism, unspecified: Secondary | ICD-10-CM | POA: Diagnosis not present

## 2024-04-05 DIAGNOSIS — R5383 Other fatigue: Secondary | ICD-10-CM | POA: Diagnosis not present

## 2024-04-05 DIAGNOSIS — R8290 Unspecified abnormal findings in urine: Secondary | ICD-10-CM | POA: Diagnosis not present

## 2024-04-05 DIAGNOSIS — E1169 Type 2 diabetes mellitus with other specified complication: Secondary | ICD-10-CM | POA: Diagnosis not present

## 2024-04-05 DIAGNOSIS — F5101 Primary insomnia: Secondary | ICD-10-CM | POA: Diagnosis not present

## 2024-04-05 DIAGNOSIS — R519 Headache, unspecified: Secondary | ICD-10-CM | POA: Diagnosis not present

## 2024-04-05 DIAGNOSIS — I5189 Other ill-defined heart diseases: Secondary | ICD-10-CM | POA: Diagnosis not present

## 2024-04-05 DIAGNOSIS — N183 Chronic kidney disease, stage 3 unspecified: Secondary | ICD-10-CM | POA: Diagnosis not present

## 2024-04-05 DIAGNOSIS — R911 Solitary pulmonary nodule: Secondary | ICD-10-CM | POA: Diagnosis not present

## 2024-04-05 DIAGNOSIS — M545 Low back pain, unspecified: Secondary | ICD-10-CM

## 2024-04-07 ENCOUNTER — Encounter: Payer: Self-pay | Admitting: Physical Therapy

## 2024-04-07 ENCOUNTER — Ambulatory Visit: Admitting: Physical Therapy

## 2024-04-07 DIAGNOSIS — M6281 Muscle weakness (generalized): Secondary | ICD-10-CM | POA: Diagnosis not present

## 2024-04-07 DIAGNOSIS — M25551 Pain in right hip: Secondary | ICD-10-CM | POA: Diagnosis not present

## 2024-04-07 DIAGNOSIS — R2689 Other abnormalities of gait and mobility: Secondary | ICD-10-CM | POA: Diagnosis not present

## 2024-04-07 NOTE — Therapy (Signed)
 OUTPATIENT PHYSICAL THERAPY DAILY NOTE  Patient Name: Taylor Edwards MRN: [id number] DOB:April 17, 1941, 83 y.o., female Today's Date: 04/07/2024   PT End of Session - 04/07/24 1059     Visit Number 5    Date for PT Re-Evaluation 04/22/24    Authorization Type [employer] MCR - LEFS    Progress Note Due on Visit 10    PT Start Time 1100    PT Stop Time 1141    PT Time Calculation (min) 41 min    Activity Tolerance Patient tolerated treatment well    Behavior During Therapy WFL for tasks assessed/performed            Past Medical History:  Diagnosis Date   Anemia    no current med.   Arthritis    knees   Diabetes mellitus    GERD (gastroesophageal reflux disease)    no current med.   Gout    Hypercholesteremia    Hypertension    has been on med. x 10 yrs.   Hypothyroid    IBS (irritable bowel syndrome)    Post-nasal drip    current cough   Sickle cell trait (HCC)    Urinary urgency    Past Surgical History:  Procedure Laterality Date   ABDOMINAL HYSTERECTOMY  1995   BUNIONECTOMY  08/25/2011   Procedure: [name];  Surgeon: [name], MD;  Location: [facility];  Service: Orthopedics;  Laterality: Right;   BUNIONECTOMY Left 08/04/2022   Procedure: LEFT FOOT BUNIONECTOMY;  Surgeon: [name], MD;  Location: [facility];  Service: Orthopedics;  Laterality: Left;   CATARACT EXTRACTION  2016   both eyes   COLONOSCOPY W/ BIOPSIES AND POLYPECTOMY     DILATION AND CURETTAGE OF UTERUS     INJECTION KNEE  08/25/2011   Procedure: KNEE INJECTION;  Surgeon: [name], MD;  Location: [facility];  Service: Orthopedics;  Laterality: Bilateral;   KNEE ARTHROSCOPY  12/11/2008   bilat., with chondroplasty   KNEE JOINT MANIPULATION  10/30/2009   bilat.   OPEN REDUCTION INTERNAL FIXATION (ORIF) DISTAL RADIAL FRACTURE Left 11/17/2014   Procedure: OPEN REDUCTION INTERNAL FIXATION (ORIF) LEFT DISTAL RADIUS  FRACTURE;  Surgeon: [name], MD;  Location: [facility];  Service: Orthopedics;  Laterality: Left;   SHOULDER ARTHROSCOPY WITH ROTATOR CUFF REPAIR AND SUBACROMIAL DECOMPRESSION Right 12/15/2022   Procedure: SHOULDER ARTHROSCOPY WITH ROTATOR CUFF REPAIR AND SUBACROMIAL DECOMPRESSION;  Surgeon: [name], MD;  Location: [facility];  Service: Orthopedics;  Laterality: Right;   TOTAL KNEE ARTHROPLASTY Bilateral 09/26/2009   bilat.   Patient Active Problem List   Diagnosis Date Noted   Posterior vitreous detachment, both eyes 03/26/2022   Diabetes mellitus without complication (HCC) 03/26/2022   Pseudophakia, both eyes 03/26/2022   History of total knee replacement, left 04/03/2019   History of total knee arthroplasty, right 04/03/2019   DERMATOFIBROMA 10/26/2007   ANEMIA, CHRONIC 10/26/2007   SICKLE CELL TRAIT 10/26/2007   ESSENTIAL HYPERTENSION 10/26/2007   GASTROESOPHAGEAL REFLUX DISEASE 10/26/2007   GASTRITIS 10/26/2007   HIATAL HERNIA 10/26/2007   CONSTIPATION, CHRONIC 10/26/2007   GUAIAC POSITIVE STOOL 10/26/2007    PCP: Charlott Dorn LABOR, MD  REFERRING PROVIDER: [name], MD  THERAPY DIAG:  Pain in right hip  Muscle weakness  Other abnormalities of gait and mobility  Muscle weakness (generalized)  REFERRING DIAG: lateral hip pain  Rationale for Evaluation and Treatment:  Rehabilitation  SUBJECTIVE:  PERTINENT PAST HISTORY:  Anemia, bil TKA, DM, R R/C repair      PRECAUTIONS: None  WEIGHT BEARING RESTRICTIONS No  FALLS:  Has patient fallen in last 6 months? Yes, Number of falls: 1 - March 9th, stumbled on a pipe in the sidewalk  MOI/History of condition:  Onset date: March 9th  SUBJECTIVE STATEMENT Pt reports that her hip is improved overall but continues to have instances of significant pain, particularly after sitting for a long period.   EVAL: PAXTON KANAAN is a 83 y.o. female who presents to clinic with chief complaint of R hip and LE pain  which started after she stumbled over a pipe in the sidewalk and fell down when going into a store.  Since this time she has had R lateral and posterior hip pain which she reports can radiate below the knee.  The pain increases with standing and walking.  She was told that she had an exacerbation of R hip bursitis but she feels it may be more of a sciatic pain.  She has had imaging of everything including at least R hip and knee which were all clear for fracture.  She reports some intermittent n/t in bil toes but she is not positive if this is new or not.   Red flags:  denies BB changes and saddle anesthesia  Pain:  Are you having pain? 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  Assistive Device: SPC as needed  Patient Goals/Specific Activities: reduce pain and improve ability to walk for longer periods   OBJECTIVE:   DIAGNOSTIC FINDINGS:  Pt reports x-ray completed for low back, R hip, and knees were clear  GENERAL OBSERVATION/GAIT: Slow antalgic gait with reduced time in stance on R  SENSATION: Light touch: Appears intact subjective report of n/t in bil toes at times  PALPATION: Significant TTP of R GT  MUSCLE LENGTH: Hamstrings: Right no restriction; Left no restriction  LE MMT:  MMT Right (Eval) Left (Eval)  Hip flexion (L2, L3) 3+* 4  Knee extension (L3)    Knee flexion    Hip abduction 3+* 4  Hip extension    Hip external rotation 4 4+  Hip internal rotation 4 4+  Hip adduction    Ankle dorsiflexion (L4)    Ankle plantarflexion (S1)    Ankle inversion    Ankle eversion    Great Toe ext (L5)    Grossly     (Blank rows = not tested, score listed is out of 5 possible points.  N = WNL, D = diminished, C = clear  for gross weakness with myotome testing, * = concordant pain with testing)  LE ROM:  ROM Right (Eval) Left (Eval)  Hip flexion 90 90  Hip extension    Hip abduction    Hip adduction    Hip internal rotation n n  Hip external rotation n n  Knee extension    Knee flexion    Ankle dorsiflexion    Ankle plantarflexion    Ankle inversion    Ankle eversion     (Blank rows = not tested, N = WNL, * = concordant pain with testing)  SPECIAL TESTS:  FADIR: (~) resisted  FABER: (+) R lateral hip Slump (-) bil SLR (+) R (-) L  PATIENT  SURVEYS:  LEFS: 21/80  Functional Tests  Eval    2 MWT 265'                                                           TODAY'S TREATMENT:  [facility] Adult PT Treatment  04/07/2024:  Therapeutic Exercise: nu-step L5 53m while taking subjective and planning session with patient Knee ext machine - 3x10 @ 5# Knee flexion machine - 3x10 @ 25# Seated march with blue TB  Therapeutic Activity  STS - 4x5 Lateral walking at counter With RTB Standing hip abd with RTB Monster walk fwd and back - RTB  [facility] Adult PT Treatment:                                                DATE: 03/30/24 Therapeutic Exercise: Nustep L4 8 min Neuromuscular re-ed: Supine hip fallouts GTB 15x B, 15/15 unilaterally Bridge against GTB 15x FAQs with adduction 15x R clamshells GTB 15x P-ball curl ups 15x B, 15/15 unilaterally Therapeutic Activity: Seated R hamstring stretch 30s x2 Supine R hip flexor stretch  R ITB stretch 30s x2 (manual)  [facility] Adult PT Treatment:                                                DATE: 03/16/24 Therapeutic Exercise: Nustep L4 8 min Neuromuscular re-ed: Supine hip fallouts RTB 15x B, 15/15 unilaterally Bridge against RTB 15x FAQs with adduction 15x P-ball curl ups 15x B, 15/15 unilaterally Therapeutic Activity: Seated hamstring stretch 30s x2 Supine QL stretch 30s x2  [facility] Adult PT Treatment  03/04/2024:  Therapeutic  Exercise: nu-step L5 53m while taking subjective and planning session with patient Static hip abd - supine - 2x10 - 10'' ea Static hip abd in standing - 2x10 - 10'' ea Slow bridge - 10x Hip abd slider - RTB at ankle - 10x ea - slow     HOME EXERCISE PROGRAM: Access Code: [email or web address] URL: [email or web address] Date: 03/04/2024 Prepared by: Helene Gasmen  Exercises - Hooklying Isometric Hip Abduction with Belt  - 1 x daily - 7 x weekly - 1-2 sets - 10 reps - Side Stepping with Counter Support  - 1 x daily - 7 x weekly - 2 sets - 10 reps - Supine Bridge  - 1 x daily - 7 x weekly - 1 sets - 10 reps - 5 seconds hold - Squat with Counter Support  - 1 x daily - 7 x weekly - 1-2 sets - 10 reps - 2-3 seconds hold  Treatment priorities   Eval        Progressive lateral hip loading         gait        endurance        Encouragement to use SPC                  ASSESSMENT:  CLINICAL IMPRESSION: Rock tolerated session well with no adverse reaction.  Continues to have some nagging pain, particularly after sitting for long periods.  Focused more on standing and compound movements today with no increase in pain and fatigue noted.  EVAL: Kaylean is a 83 y.o. female who presents to clinic with signs and sxs consistent with R lateral hip pain following fall on 3/9.  No imaging available in epic, but pt reports she has had x-rays of her lumbar spine, R knee, and R hip with no fracture or other significant findings.  Today's exam is consistent with R greater trochanteric pain syndrome.  She has (-) slump but reproduction of back and LE pain with SLR on R so unable to rule out some element of nerve root irritation.  Encouraged her to use a cane from both a safety and de-loading stand point; she is hesitant but states she will.  Josselyne will benefit from skilled PT to address relevant deficits and improve safety and comfort with daily tasks involving standing and ambulation including  work.  OBJECTIVE IMPAIRMENTS: Pain, R hip ROM, R hip strength, gait, balance  ACTIVITY LIMITATIONS: walking, standing, bending, squatting, pain at work which requires walking up to 100 yards  PERSONAL FACTORS: See medical history and pertinent history   REHAB POTENTIAL: Good  CLINICAL DECISION MAKING: Evolving/moderate complexity  EVALUATION COMPLEXITY: Moderate   GOALS:   SHORT TERM GOALS: Target date: 03/25/2024   Marlynn will be >75% HEP compliant to improve carryover between sessions and facilitate independent management of condition  Evaluation: ongoing Goal status: Partially met   LONG TERM GOALS: Target date: 04/22/2024   Shenise will self report >/= 50% decrease in pain from evaluation to improve function in daily tasks  Evaluation/Baseline: 10/10 max pain; 03/30/24 8/10 Goal status: Ongoing   2.  Blonnie will show a >/= 30 pt improvement in LEFS score (MCID is ~11% or 9 pts) as a proxy for functional improvement   Evaluation/Baseline: 21/80 pts Goal status: INITIAL   3.  Marquesa will be able to walk up to 100 yards for her job, not limited by pain  Evaluation/Baseline: limited; 03/30/24 100 yards with moderate discomfort Goal status: INITIAL   4.  Arianna will improve two minute walk test to 325 feet (MCID 40 ft).  Evaluation/Baseline: 265 ft Goal status: INITIAL   PLAN: PT FREQUENCY: 1-2x/week  PT DURATION: 8 weeks  PLANNED INTERVENTIONS:  97164- PT Re-evaluation, 97110-Therapeutic exercises, 97530- Therapeutic activity, [id number]- Neuromuscular re-education, 97535- Self Care, 02859- Manual therapy, [id number]- Gait training, [id number]- Aquatic Therapy, [phone]- Electrical stimulation (manual), [id number]- Vasopneumatic device, [id number]- Traction (mechanical), [id number]- Ionotophoresis 4mg /ml Dexamethasone , Taping, Dry Needling, Joint manipulation, and Spinal manipulation.   Helene BRAVO Johnathyn Viscomi PT 04/07/2024, 12:55 PM

## 2024-04-13 ENCOUNTER — Ambulatory Visit: Admitting: Physical Therapy

## 2024-04-13 ENCOUNTER — Encounter: Payer: Self-pay | Admitting: Physical Therapy

## 2024-04-13 DIAGNOSIS — M25551 Pain in right hip: Secondary | ICD-10-CM

## 2024-04-13 DIAGNOSIS — M6281 Muscle weakness (generalized): Secondary | ICD-10-CM

## 2024-04-13 DIAGNOSIS — R2689 Other abnormalities of gait and mobility: Secondary | ICD-10-CM | POA: Diagnosis not present

## 2024-04-13 NOTE — Therapy (Signed)
 Progress Note Reporting Period 6/7 to 7/24  See note below for Objective Data and Assessment of Progress/Goals.     Patient Name: Taylor Edwards MRN: [id number] DOB:[birth year], 83 y.o., female Today's Date: 04/13/2024   PT End of Session - 04/13/24 1147     Visit Number 6    Date for PT Re-Evaluation 04/22/24    Authorization Type [employer] MCR - LEFS    Progress Note Due on Visit 10    PT Start Time 1146    PT Stop Time 1230    PT Time Calculation (min) 44 min    Activity Tolerance Patient tolerated treatment well    Behavior During Therapy WFL for tasks assessed/performed            Past Medical History:  Diagnosis Date   Anemia    no current med.   Arthritis    knees   Diabetes mellitus    GERD (gastroesophageal reflux disease)    no current med.   Gout    Hypercholesteremia    Hypertension    has been on med. x 10 yrs.   Hypothyroid    IBS (irritable bowel syndrome)    Post-nasal drip    current cough   Sickle cell trait (HCC)    Urinary urgency    Past Surgical History:  Procedure Laterality Date   ABDOMINAL HYSTERECTOMY  1995   BUNIONECTOMY  08/25/2011   Procedure: Taylor Edwards;  Surgeon: Taylor KANDICE Herald, MD;  Location: [facility];  Service: Orthopedics;  Laterality: Right;   BUNIONECTOMY Left 08/04/2022   Procedure: LEFT FOOT BUNIONECTOMY;  Surgeon: Edwards Maude, MD;  Location: [facility];  Service: Orthopedics;  Laterality: Left;   CATARACT EXTRACTION  2016   both eyes   COLONOSCOPY W/ BIOPSIES AND POLYPECTOMY     DILATION AND CURETTAGE OF UTERUS     INJECTION KNEE  08/25/2011   Procedure: KNEE INJECTION;  Surgeon: Taylor KANDICE Herald, MD;  Location: [facility];  Service: Orthopedics;  Laterality: Bilateral;   KNEE ARTHROSCOPY  12/11/2008   bilat., with chondroplasty   KNEE JOINT MANIPULATION  10/30/2009   bilat.   OPEN REDUCTION INTERNAL FIXATION (ORIF) DISTAL RADIAL FRACTURE Left 11/17/2014   Procedure: OPEN REDUCTION  INTERNAL FIXATION (ORIF) LEFT DISTAL RADIUS  FRACTURE;  Surgeon: Taylor Robinsons, MD;  Location: [facility];  Service: Orthopedics;  Laterality: Left;   SHOULDER ARTHROSCOPY WITH ROTATOR CUFF REPAIR AND SUBACROMIAL DECOMPRESSION Right 12/15/2022   Procedure: SHOULDER ARTHROSCOPY WITH ROTATOR CUFF REPAIR AND SUBACROMIAL DECOMPRESSION;  Surgeon: Taylor Soulier, MD;  Location: [facility];  Service: Orthopedics;  Laterality: Right;   TOTAL KNEE ARTHROPLASTY Bilateral 09/26/2009   bilat.   Patient Active Problem List   Diagnosis Date Noted   Posterior vitreous detachment, both eyes 03/26/2022   Diabetes mellitus without complication (HCC) 03/26/2022   Pseudophakia, both eyes 03/26/2022   History of total knee replacement, left 04/03/2019   History of total knee arthroplasty, right 04/03/2019   DERMATOFIBROMA 10/26/2007   ANEMIA, CHRONIC 10/26/2007   SICKLE CELL TRAIT 10/26/2007   ESSENTIAL HYPERTENSION 10/26/2007   GASTROESOPHAGEAL REFLUX DISEASE 10/26/2007   GASTRITIS 10/26/2007   HIATAL HERNIA 10/26/2007   CONSTIPATION, CHRONIC 10/26/2007   GUAIAC POSITIVE STOOL 10/26/2007    PCP: Taylor Dorn LABOR, MD  REFERRING PROVIDER: Charlott Dorn Edwards, *  THERAPY DIAG:  Pain in right hip - Plan: PT plan of care cert/re-cert  Muscle weakness - Plan: PT plan of care cert/re-cert  Other abnormalities of gait and mobility - Plan: PT plan of care cert/re-cert  REFERRING DIAG: lateral hip pain  Rationale for Evaluation and Treatment:  Rehabilitation  SUBJECTIVE:  PERTINENT PAST HISTORY:  Anemia, bil TKA, DM, R R/C repair      PRECAUTIONS: None  WEIGHT BEARING RESTRICTIONS No  FALLS:  Has patient fallen in last 6 months? Yes, Number of falls: 1 - March 9th, stumbled on a pipe in the sidewalk  MOI/History of condition:  Onset date: March 9th  SUBJECTIVE STATEMENT Pt reports that her hip pain remains high.  She is able to walk for work but this causes and increase  in her pain.  She feel her balance is off and she is more likely to catch her R foot while walking now.  EVAL: Taylor Edwards is a 83 y.o. female who presents to clinic with chief complaint of R hip and LE pain which started after she stumbled over a pipe in the sidewalk and fell down when going into a store.  Since this time she has had R lateral and posterior hip pain which she reports can radiate below the knee.  The pain increases with standing and walking.  She was told that she had an exacerbation of R hip bursitis but she feels it may be more of a sciatic pain.  She has had imaging of everything including at least R hip and knee which were all clear for fracture.  She reports some intermittent n/t in bil toes but she is not positive if this is new or not.   Red flags:  denies BB changes and saddle anesthesia  Pain:  Are you having pain? 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  Assistive Device: SPC as needed  Patient Goals/Specific Activities: reduce pain and improve ability to walk for longer periods   OBJECTIVE:   DIAGNOSTIC FINDINGS:  Pt reports x-ray completed for low back, R hip, and knees were clear  GENERAL OBSERVATION/GAIT: Slow antalgic gait with reduced time in stance on R  SENSATION: Light touch: Appears intact subjective report of n/t in bil toes at times  PALPATION: Significant TTP of R GT  MUSCLE LENGTH: Hamstrings: Right no restriction; Left no restriction  LE MMT:  MMT Right (Eval) Left (Eval)  Hip flexion (L2, L3) 3+* 4  Knee extension (L3)    Knee flexion    Hip abduction 3+* 4  Hip extension    Hip external rotation 4 4+  Hip internal rotation 4 4+  Hip adduction    Ankle dorsiflexion (L4)     Ankle plantarflexion (S1)    Ankle inversion    Ankle eversion    Great Toe ext (L5)    Grossly     (Blank rows = not tested, score listed is out of 5 possible points.  N = WNL, D = diminished, C = clear for gross weakness with myotome testing, * = concordant pain with testing)  LE ROM:  ROM Right (Eval) Left (Eval)  Hip flexion 90 90  Hip extension    Hip abduction    Hip adduction    Hip internal rotation n n  Hip external rotation n n  Knee extension    Knee flexion    Ankle dorsiflexion  Ankle plantarflexion    Ankle inversion    Ankle eversion     (Blank rows = not tested, N = WNL, * = concordant pain with testing)  SPECIAL TESTS:  FADIR: (~) resisted  FABER: (+) R lateral hip Slump (-) bil SLR (+) R (-) L  PATIENT SURVEYS:  LEFS: 21/80  Functional Tests  Eval    2 MWT 265'    30'' STS: 8x  UE used? Y    Progressive balance screen (highest level completed for >/= 10''):  Feet together: 10'' Semi Tandem: R in rear 10'', L in rear 10'' Tandem: R in rear unable, L in rear                                                     TODAY'S TREATMENT:  [facility] Adult PT Treatment  04/13/2024:  Therapeutic Activity  collecting information for goals, checking progress, and reviewing with patient  [facility] Adult PT Treatment:                                                DATE: 03/30/24 Therapeutic Exercise: Nustep L4 8 min Neuromuscular re-ed: Supine hip fallouts GTB 15x B, 15/15 unilaterally Bridge against GTB 15x FAQs with adduction 15x R clamshells GTB 15x P-ball curl ups 15x B, 15/15 unilaterally Therapeutic Activity: Seated R hamstring stretch 30s x2 Supine R hip flexor stretch  R ITB stretch 30s x2 (manual)  [facility] Adult PT Treatment:                                                DATE: 03/16/24 Therapeutic Exercise: Nustep L4 8 min Neuromuscular re-ed: Supine hip fallouts RTB 15x B, 15/15 unilaterally Bridge against RTB 15x FAQs with  adduction 15x P-ball curl ups 15x B, 15/15 unilaterally Therapeutic Activity: Seated hamstring stretch 30s x2 Supine QL stretch 30s x2  [facility] Adult PT Treatment  03/04/2024:  Therapeutic Exercise: nu-step L5 64m while taking subjective and planning session with patient Static hip abd - supine - 2x10 - 10'' ea Static hip abd in standing - 2x10 - 10'' ea Slow bridge - 10x Hip abd slider - RTB at ankle - 10x ea - slow     HOME EXERCISE PROGRAM: Access Code: [email or web address] URL: [email or web address] Date: 04/13/2024 Prepared by: Helene Gasmen  Exercises - Sit to Stand with Armchair  - 1 x daily - 7 x weekly - 3 sets - 10 reps - Standing Tandem Balance with Counter Support  - 1 x daily - 7 x weekly - 3 sets - 45'' hold  Treatment priorities   Eval        Progressive lateral hip loading         gait        endurance        Encouragement to use SPC                  ASSESSMENT:  CLINICAL IMPRESSION:   Rock tolerated session well with no adverse reaction.  Upon goal recheck Ayssa has met all long term  goals with the exception of pain which remains quite high (8/10) with walking more than about 2 minutes.  She still has difficulty walking longer distances for work and daily tasks such as shopping d/t pain.  Given her progress, and limited visits so far, I will extend her POC to address her pain and add in a balance and LE strength goal.  EVAL: Aliceson is a 83 y.o. female who presents to clinic with signs and sxs consistent with R lateral hip pain following fall on 3/9.  No imaging available in epic, but pt reports she has had x-rays of her lumbar spine, R knee, and R hip with no fracture or other significant findings.  Today's exam is consistent with R greater trochanteric pain syndrome.  She has (-) slump but reproduction of back and LE pain with SLR on R so unable to rule out some element of nerve root irritation.  Encouraged her to use a cane from both a safety and  de-loading stand point; she is hesitant but states she will.  Dorthy will benefit from skilled PT to address relevant deficits and improve safety and comfort with daily tasks involving standing and ambulation including work.  OBJECTIVE IMPAIRMENTS: Pain, R hip ROM, R hip strength, gait, balance  ACTIVITY LIMITATIONS: walking, standing, bending, squatting, pain at work which requires walking up to 100 yards  PERSONAL FACTORS: See medical history and pertinent history   REHAB POTENTIAL: Good  CLINICAL DECISION MAKING: Evolving/moderate complexity  EVALUATION COMPLEXITY: Moderate   GOALS:   SHORT TERM GOALS: Target date: 03/25/2024   Kennya will be >75% HEP compliant to improve carryover between sessions and facilitate independent management of condition  Evaluation: ongoing Goal status: Partially met   LONG TERM GOALS: Target date: 04/22/2024   Emmaleah will self report >/= 50% decrease in pain from evaluation to improve function in daily tasks  Evaluation/Baseline: 10/10 max pain; 03/30/24 8/10 7/24: 8/10 Goal status: Ongoing   2.  Luvia will show a >/= 30 pt improvement in LEFS score (MCID is ~11% or 9 pts) as a proxy for functional improvement   Evaluation/Baseline: 21/80 pts 7/24: 34/80 Goal status: MET   3.  Christee will be able to walk up to 100 yards for her job, not limited by pain  Evaluation/Baseline: limited; 03/30/24 100 yards with moderate discomfort 7/24: MET Goal status: MET   4.  Ladajah will improve two minute walk test to 325 feet (MCID 40 ft).  Evaluation/Baseline: 265 ft 7/24: 325' Goal status: MET  5.  Mireyah will improve 30'' STS (MCID 2) to >/= 8x (w/ UE?: n) to show improved LE strength and improved transfers   Evaluation/Baseline: 8x  w/ UE? y Goal status: INITIAL  6.  Anntionette will be able to stand for >30'' in tandem stance, to show a significant improvement in balance in order to reduce fall risk   Evaluation/Baseline: uanble Goal status:  INITIAL   PLAN: PT FREQUENCY: 1-2x/week  PT DURATION: 8 weeks  PLANNED INTERVENTIONS:  97164- PT Re-evaluation, 97110-Therapeutic exercises, 97530- Therapeutic activity, [id number]- Neuromuscular re-education, 97535- Self Care, 02859- Manual therapy, [id number]- Gait training, [id number]- Aquatic Therapy, [phone]- Electrical stimulation (manual), [id number]- Vasopneumatic device, [id number]- Traction (mechanical), [id number]- Ionotophoresis 4mg /ml Dexamethasone , Taping, Dry Needling, Joint manipulation, and Spinal manipulation.   Helene BRAVO Minahil Quinlivan PT 04/13/2024, 12:28 PM

## 2024-05-01 ENCOUNTER — Inpatient Hospital Stay: Admission: RE | Admit: 2024-05-01 | Source: Ambulatory Visit

## 2024-05-01 NOTE — Therapy (Signed)
 Progress Note Reporting Period 6/7 to 7/24  See note below for Objective Data and Assessment of Progress/Goals.     Patient Name: Taylor Edwards MRN: [id number] DOB:[birth year], 83 y.o., female Today's Date: 05/01/2024       Past Medical History:  Diagnosis Date   Anemia    no current med.   Arthritis    knees   Diabetes mellitus    GERD (gastroesophageal reflux disease)    no current med.   Gout    Hypercholesteremia    Hypertension    has been on med. x 10 yrs.   Hypothyroid    IBS (irritable bowel syndrome)    Post-nasal drip    current cough   Sickle cell trait (HCC)    Urinary urgency    Past Surgical History:  Procedure Laterality Date   ABDOMINAL HYSTERECTOMY  1995   BUNIONECTOMY  08/25/2011   Procedure: [name];  Surgeon: [name], MD;  Location: [facility];  Service: Orthopedics;  Laterality: Right;   BUNIONECTOMY Left 08/04/2022   Procedure: LEFT FOOT BUNIONECTOMY;  Surgeon: [name], MD;  Location: [facility];  Service: Orthopedics;  Laterality: Left;   CATARACT EXTRACTION  2016   both eyes   COLONOSCOPY W/ BIOPSIES AND POLYPECTOMY     DILATION AND CURETTAGE OF UTERUS     INJECTION KNEE  08/25/2011   Procedure: KNEE INJECTION;  Surgeon: [name], MD;  Location: [facility];  Service: Orthopedics;  Laterality: Bilateral;   KNEE ARTHROSCOPY  12/11/2008   bilat., with chondroplasty   KNEE JOINT MANIPULATION  10/30/2009   bilat.   OPEN REDUCTION INTERNAL FIXATION (ORIF) DISTAL RADIAL FRACTURE Left 11/17/2014   Procedure: OPEN REDUCTION INTERNAL FIXATION (ORIF) LEFT DISTAL RADIUS  FRACTURE;  Surgeon: [name], MD;  Location: [facility];  Service: Orthopedics;  Laterality: Left;   SHOULDER ARTHROSCOPY WITH ROTATOR CUFF REPAIR AND SUBACROMIAL DECOMPRESSION Right 12/15/2022   Procedure: SHOULDER ARTHROSCOPY WITH ROTATOR CUFF REPAIR AND SUBACROMIAL DECOMPRESSION;  Surgeon: [name], MD;  Location:  [facility];  Service: Orthopedics;  Laterality: Right;   TOTAL KNEE ARTHROPLASTY Bilateral 09/26/2009   bilat.   Patient Active Problem List   Diagnosis Date Noted   Posterior vitreous detachment, both eyes 03/26/2022   Diabetes mellitus without complication (HCC) 03/26/2022   Pseudophakia, both eyes 03/26/2022   History of total knee replacement, left 04/03/2019   History of total knee arthroplasty, right 04/03/2019   DERMATOFIBROMA 10/26/2007   ANEMIA, CHRONIC 10/26/2007   SICKLE CELL TRAIT 10/26/2007   ESSENTIAL HYPERTENSION 10/26/2007   GASTROESOPHAGEAL REFLUX DISEASE 10/26/2007   GASTRITIS 10/26/2007   HIATAL HERNIA 10/26/2007   CONSTIPATION, CHRONIC 10/26/2007   GUAIAC POSITIVE STOOL 10/26/2007    PCP: Charlott Dorn LABOR, MD  REFERRING PROVIDER: Charlott Dorn A, *  THERAPY DIAG:  No diagnosis found.  REFERRING DIAG: lateral hip pain  Rationale for Evaluation and Treatment:  Rehabilitation  SUBJECTIVE:  PERTINENT PAST HISTORY:  Anemia, bil TKA, DM, R R/C repair      PRECAUTIONS: None  WEIGHT BEARING RESTRICTIONS No  FALLS:  Has patient fallen in last 6 months? Yes, Number of falls: 1 - March 9th, stumbled on a pipe in the sidewalk  MOI/History of condition:  Onset date: March 9th  SUBJECTIVE STATEMENT Pt reports that her hip pain remains high.  She is able to walk for work but this causes and increase in her pain.  She feel her balance is off  and she is more likely to catch her R foot while walking now.  EVAL: Taylor Edwards is a 83 y.o. female who presents to clinic with chief complaint of R hip and LE pain which started after she stumbled over a pipe in the sidewalk and fell down when going into a store.  Since this time she has had R lateral and posterior hip pain which she reports can radiate below the knee.  The pain increases with standing and walking.  She was told that she had an exacerbation of R hip bursitis but she feels it  may be more of a sciatic pain.  She has had imaging of everything including at least R hip and knee which were all clear for fracture.  She reports some intermittent n/t in bil toes but she is not positive if this is new or not.   Red flags:  denies BB changes and saddle anesthesia  Pain:  Are you having pain? 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  Assistive Device: SPC as needed  Patient Goals/Specific Activities: reduce pain and improve ability to walk for longer periods   OBJECTIVE:   DIAGNOSTIC FINDINGS:  Pt reports x-ray completed for low back, R hip, and knees were clear  GENERAL OBSERVATION/GAIT: Slow antalgic gait with reduced time in stance on R  SENSATION: Light touch: Appears intact subjective report of n/t in bil toes at times  PALPATION: Significant TTP of R GT  MUSCLE LENGTH: Hamstrings: Right no restriction; Left no restriction  LE MMT:  MMT Right (Eval) Left (Eval)  Hip flexion (L2, L3) 3+* 4  Knee extension (L3)    Knee flexion    Hip abduction 3+* 4  Hip extension    Hip external rotation 4 4+  Hip internal rotation 4 4+  Hip adduction    Ankle dorsiflexion (L4)    Ankle plantarflexion (S1)    Ankle inversion    Ankle eversion    Great Toe ext (L5)    Grossly     (Blank rows = not tested, score listed is out of 5 possible points.  N = WNL, D = diminished, C = clear for gross weakness with myotome testing, * = concordant pain with testing)  LE ROM:  ROM Right (Eval) Left (Eval)  Hip flexion 90 90  Hip extension    Hip abduction    Hip adduction    Hip internal rotation n n  Hip external rotation n n  Knee extension    Knee flexion    Ankle dorsiflexion    Ankle plantarflexion     Ankle inversion    Ankle eversion     (Blank rows = not tested, N = WNL, * = concordant pain with testing)  SPECIAL TESTS:  FADIR: (~) resisted  FABER: (+) R lateral hip Slump (-) bil SLR (+) R (-) L  PATIENT SURVEYS:  LEFS: 21/80  Functional Tests  Eval    2 MWT 265'    30'' STS: 8x  UE used? Y    Progressive balance screen (highest level completed for >/= 10''):  Feet together: 10'' Semi Tandem: R in rear 10'', L in rear 10'' Tandem: R in rear unable, L in rear  TODAY'S TREATMENT:  [facility] Adult PT Treatment  04/13/2024:  Therapeutic Activity  collecting information for goals, checking progress, and reviewing with patient  [facility] Adult PT Treatment:                                                DATE: 03/30/24 Therapeutic Exercise: Nustep L4 8 min Neuromuscular re-ed: Supine hip fallouts GTB 15x B, 15/15 unilaterally Bridge against GTB 15x FAQs with adduction 15x R clamshells GTB 15x P-ball curl ups 15x B, 15/15 unilaterally Therapeutic Activity: Seated R hamstring stretch 30s x2 Supine R hip flexor stretch  R ITB stretch 30s x2 (manual)  [facility] Adult PT Treatment:                                                DATE: 03/16/24 Therapeutic Exercise: Nustep L4 8 min Neuromuscular re-ed: Supine hip fallouts RTB 15x B, 15/15 unilaterally Bridge against RTB 15x FAQs with adduction 15x P-ball curl ups 15x B, 15/15 unilaterally Therapeutic Activity: Seated hamstring stretch 30s x2 Supine QL stretch 30s x2  [facility] Adult PT Treatment  03/04/2024:  Therapeutic Exercise: nu-step L5 64m while taking subjective and planning session with patient Static hip abd - supine - 2x10 - 10'' ea Static hip abd in standing - 2x10 - 10'' ea Slow bridge - 10x Hip abd slider - RTB at ankle - 10x ea - slow     HOME EXERCISE PROGRAM: Access Code: [email or web address] URL: [email or web address] Date:  04/13/2024 Prepared by: Helene Gasmen  Exercises - Sit to Stand with Armchair  - 1 x daily - 7 x weekly - 3 sets - 10 reps - Standing Tandem Balance with Counter Support  - 1 x daily - 7 x weekly - 3 sets - 45'' hold  Treatment priorities   Eval        Progressive lateral hip loading         gait        endurance        Encouragement to use SPC                  ASSESSMENT:  CLINICAL IMPRESSION:   Rock tolerated session well with no adverse reaction.  Upon goal recheck Taylor Edwards has met all long term goals with the exception of pain which remains quite high (8/10) with walking more than about 2 minutes.  She still has difficulty walking longer distances for work and daily tasks such as shopping d/t pain.  Given her progress, and limited visits so far, I will extend her POC to address her pain and add in a balance and LE strength goal.  EVAL: Taylor Edwards is a 83 y.o. female who presents to clinic with signs and sxs consistent with R lateral hip pain following fall on 3/9.  No imaging available in epic, but pt reports she has had x-rays of her lumbar spine, R knee, and R hip with no fracture or other significant findings.  Today's exam is consistent with R greater trochanteric pain syndrome.  She has (-) slump but reproduction of back and LE pain with SLR on R so unable to rule out some element of nerve root irritation.  Encouraged her to use a cane from  both a safety and de-loading stand point; she is hesitant but states she will.  Edit will benefit from skilled PT to address relevant deficits and improve safety and comfort with daily tasks involving standing and ambulation including work.  OBJECTIVE IMPAIRMENTS: Pain, R hip ROM, R hip strength, gait, balance  ACTIVITY LIMITATIONS: walking, standing, bending, squatting, pain at work which requires walking up to 100 yards  PERSONAL FACTORS: See medical history and pertinent history   REHAB POTENTIAL: Good  CLINICAL DECISION MAKING:  Evolving/moderate complexity  EVALUATION COMPLEXITY: Moderate   GOALS:   SHORT TERM GOALS: Target date: 03/25/2024   Taylor Edwards will be >75% HEP compliant to improve carryover between sessions and facilitate independent management of condition  Evaluation: ongoing Goal status: Partially met   LONG TERM GOALS: Target date: 04/22/2024   Taylor Edwards will self report >/= 50% decrease in pain from evaluation to improve function in daily tasks  Evaluation/Baseline: 10/10 max pain; 03/30/24 8/10 7/24: 8/10 Goal status: Ongoing   2.  Taylor Edwards will show a >/= 30 pt improvement in LEFS score (MCID is ~11% or 9 pts) as a proxy for functional improvement   Evaluation/Baseline: 21/80 pts 7/24: 34/80 Goal status: MET   3.  Taylor Edwards will be able to walk up to 100 yards for her job, not limited by pain  Evaluation/Baseline: limited; 03/30/24 100 yards with moderate discomfort 7/24: MET Goal status: MET   4.  Taylor Edwards will improve two minute walk test to 325 feet (MCID 40 ft).  Evaluation/Baseline: 265 ft 7/24: 325' Goal status: MET  5.  Taylor Edwards will improve 30'' STS (MCID 2) to >/= 8x (w/ UE?: n) to show improved LE strength and improved transfers   Evaluation/Baseline: 8x  w/ UE? y Goal status: INITIAL  6.  Taylor Edwards will be able to stand for >30'' in tandem stance, to show a significant improvement in balance in order to reduce fall risk   Evaluation/Baseline: uanble Goal status: INITIAL   PLAN: PT FREQUENCY: 1-2x/week  PT DURATION: 8 weeks  PLANNED INTERVENTIONS:  97164- PT Re-evaluation, 97110-Therapeutic exercises, 97530- Therapeutic activity, [id number]- Neuromuscular re-education, 97535- Self Care, 02859- Manual therapy, [id number]- Gait training, [id number]- Aquatic Therapy, [phone]- Electrical stimulation (manual), [id number]- Vasopneumatic device, [id number]- Traction (mechanical), [id number]- Ionotophoresis 4mg /ml Dexamethasone , Taping, Dry Needling, Joint manipulation, and Spinal manipulation.   Taylor Edwards  PT 05/01/2024, 3:59 PM

## 2024-05-02 NOTE — Therapy (Signed)
 PHYSICAL THERAPY TREATMENT NOTE    Patient Name: Taylor Edwards MRN: [id number] DOB:1941-08-31, 83 y.o., female Today's Date: 05/05/2024   PT End of Session - 05/05/24 1005     Visit Number 7    Authorization Type [employer] MCR - LEFS    Progress Note Due on Visit 10    PT Start Time 1000    PT Stop Time 1040    PT Time Calculation (min) 40 min    Activity Tolerance Patient tolerated treatment well    Behavior During Therapy WFL for tasks assessed/performed             Past Medical History:  Diagnosis Date   Anemia    no current med.   Arthritis    knees   Diabetes mellitus    GERD (gastroesophageal reflux disease)    no current med.   Gout    Hypercholesteremia    Hypertension    has been on med. x 10 yrs.   Hypothyroid    IBS (irritable bowel syndrome)    Post-nasal drip    current cough   Sickle cell trait (HCC)    Urinary urgency    Past Surgical History:  Procedure Laterality Date   ABDOMINAL HYSTERECTOMY  1995   BUNIONECTOMY  08/25/2011   Procedure: [name];  Surgeon: [name], MD;  Location: [facility];  Service: Orthopedics;  Laterality: Right;   BUNIONECTOMY Left 08/04/2022   Procedure: LEFT FOOT BUNIONECTOMY;  Surgeon: [name], MD;  Location: [facility];  Service: Orthopedics;  Laterality: Left;   CATARACT EXTRACTION  2016   both eyes   COLONOSCOPY W/ BIOPSIES AND POLYPECTOMY     DILATION AND CURETTAGE OF UTERUS     INJECTION KNEE  08/25/2011   Procedure: KNEE INJECTION;  Surgeon: [name], MD;  Location: [facility];  Service: Orthopedics;  Laterality: Bilateral;   KNEE ARTHROSCOPY  12/11/2008   bilat., with chondroplasty   KNEE JOINT MANIPULATION  10/30/2009   bilat.   OPEN REDUCTION INTERNAL FIXATION (ORIF) DISTAL RADIAL FRACTURE Left 11/17/2014   Procedure: OPEN REDUCTION INTERNAL FIXATION (ORIF) LEFT DISTAL RADIUS  FRACTURE;  Surgeon: [name], MD;  Location: [facility];  Service:  Orthopedics;  Laterality: Left;   SHOULDER ARTHROSCOPY WITH ROTATOR CUFF REPAIR AND SUBACROMIAL DECOMPRESSION Right 12/15/2022   Procedure: SHOULDER ARTHROSCOPY WITH ROTATOR CUFF REPAIR AND SUBACROMIAL DECOMPRESSION;  Surgeon: [name], MD;  Location: [facility];  Service: Orthopedics;  Laterality: Right;   TOTAL KNEE ARTHROPLASTY Bilateral 09/26/2009   bilat.   Patient Active Problem List   Diagnosis Date Noted   Posterior vitreous detachment, both eyes 03/26/2022   Diabetes mellitus without complication (HCC) 03/26/2022   Pseudophakia, both eyes 03/26/2022   History of total knee replacement, left 04/03/2019   History of total knee arthroplasty, right 04/03/2019   DERMATOFIBROMA 10/26/2007   ANEMIA, CHRONIC 10/26/2007   SICKLE CELL TRAIT 10/26/2007   ESSENTIAL HYPERTENSION 10/26/2007   GASTROESOPHAGEAL REFLUX DISEASE 10/26/2007   GASTRITIS 10/26/2007   HIATAL HERNIA 10/26/2007   CONSTIPATION, CHRONIC 10/26/2007   GUAIAC POSITIVE STOOL 10/26/2007    PCP: Charlott Dorn LABOR, MD  REFERRING PROVIDER: Charlott Dorn A, *  THERAPY DIAG:  Pain in right hip  Muscle weakness  Other abnormalities of gait and mobility  REFERRING DIAG: lateral hip pain  Rationale for Evaluation and Treatment:  Rehabilitation  SUBJECTIVE:      PERTINENT PAST HISTORY:  Anemia, bil TKA, DM, R  R/C repair      PRECAUTIONS: None  WEIGHT BEARING RESTRICTIONS No  FALLS:  Has patient fallen in last 6 months? Yes, Number of falls: 1 - March 9th, stumbled on a pipe in the sidewalk  MOI/History of condition:  Onset date: March 9th  SUBJECTIVE STATEMENT Arrives to session with no R hip pain.  Patient reports pain with prolonged ambulation and stair climbing.  EVAL: Taylor Edwards is a 83 y.o. female who presents to clinic with chief complaint of R hip and LE pain which started after she stumbled over a pipe in the sidewalk and fell down when going into a store.  Since  this time she has had R lateral and posterior hip pain which she reports can radiate below the knee.  The pain increases with standing and walking.  She was told that she had an exacerbation of R hip bursitis but she feels it may be more of a sciatic pain.  She has had imaging of everything including at least R hip and knee which were all clear for fracture.  She reports some intermittent n/t in bil toes but she is not positive if this is new or not.   Red flags:  denies BB changes and saddle anesthesia  Pain:  Are you having pain? 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  Assistive Device: SPC as needed  Patient Goals/Specific Activities: reduce pain and improve ability to walk for longer periods   OBJECTIVE:   DIAGNOSTIC FINDINGS:  Pt reports x-ray completed for low back, R hip, and knees were clear  GENERAL OBSERVATION/GAIT: Slow antalgic gait with reduced time in stance on R  SENSATION: Light touch: Appears intact subjective report of n/t in bil toes at times  PALPATION: Significant TTP of R GT  MUSCLE LENGTH: Hamstrings: Right no restriction; Left no restriction  LE MMT:  MMT Right (Eval) Left (Eval)  Hip flexion (L2, L3) 3+* 4  Knee extension (L3)    Knee flexion    Hip abduction 3+* 4  Hip extension    Hip external rotation 4 4+  Hip internal rotation 4 4+  Hip adduction    Ankle dorsiflexion (L4)    Ankle plantarflexion (S1)    Ankle inversion    Ankle eversion    Great Toe ext (L5)    Grossly     (Blank rows = not tested, score listed is out of 5 possible points.  N = WNL, D = diminished, C = clear for gross weakness with myotome testing, * = concordant pain with testing)  LE ROM:  ROM Right (Eval)  Left (Eval)  Hip flexion 90 90  Hip extension    Hip abduction    Hip adduction    Hip internal rotation n n  Hip external rotation n n  Knee extension    Knee flexion    Ankle dorsiflexion    Ankle plantarflexion    Ankle inversion    Ankle eversion     (Blank rows = not tested, N = WNL, * = concordant pain with testing)  SPECIAL TESTS:  FADIR: (~) resisted  FABER: (+) R lateral hip Slump (-) bil SLR (+) R (-) L  PATIENT SURVEYS:  LEFS: 21/80  Functional Tests  Eval  2 MWT 265'    30'' STS: 8x  UE used? Y    Progressive balance screen (highest level completed for >/= 10''):  Feet together: 10'' Semi Tandem: R in rear 10'', L in rear 10'' Tandem: R in rear unable, L in rear                                                     TODAY'S TREATMENT: [facility] Adult PT Treatment:                                                DATE: 05/05/24 Therapeutic Exercise: Nustep L4 8 min Neuromuscular re-ed: Supine hip fallouts BluTB 10x B, 10/10 unilaterally Bridge against BluTB 10x FAQs with adduction 15x B clamshells BluTB 10x P-ball curl ups 15x B, 15/15 unilaterally Therapeutic Activity: Seated B hamstring stretch 30s x2 Supine R hip flexor stretch 30s x2 R ITB stretch 30s x2 strap Bridge with ball 10x   [facility] Adult PT Treatment  04/13/2024:  Therapeutic Activity  collecting information for goals, checking progress, and reviewing with patient  [facility] Adult PT Treatment:                                                DATE: 03/30/24 Therapeutic Exercise: Nustep L4 8 min Neuromuscular re-ed: Supine hip fallouts GTB 15x B, 15/15 unilaterally Bridge against GTB 15x FAQs with adduction 15x R clamshells GTB 15x P-ball curl ups 15x B, 15/15 unilaterally Therapeutic Activity: Seated R hamstring stretch 30s x2 Supine R hip flexor stretch  R ITB stretch 30s x2 (manual)  [facility] Adult PT Treatment:                                                DATE:  03/16/24 Therapeutic Exercise: Nustep L4 8 min Neuromuscular re-ed: Supine hip fallouts RTB 15x B, 15/15 unilaterally Bridge against RTB 15x FAQs with adduction 15x P-ball curl ups 15x B, 15/15 unilaterally Therapeutic Activity: Seated hamstring stretch 30s x2 Supine QL stretch 30s x2  [facility] Adult PT Treatment  03/04/2024:  Therapeutic Exercise: nu-step L5 19m while taking subjective and planning session with patient Static hip abd - supine - 2x10 - 10'' ea Static hip abd in standing - 2x10 - 10'' ea Slow bridge - 10x Hip abd slider - RTB at ankle - 10x ea - slow     HOME EXERCISE PROGRAM: Access Code: [email or web address] URL: [email or web address] Date: 04/13/2024 Prepared by: Helene Gasmen  Exercises - Sit to Stand with Armchair  - 1 x daily - 7 x weekly - 3 sets - 10 reps - Standing Tandem Balance with Counter Support  - 1 x daily - 7 x weekly - 3 sets - 45'' hold  Treatment priorities   Eval        Progressive lateral hip loading         gait        endurance  Encouragement to use SPC                  ASSESSMENT:  CLINICAL IMPRESSION: Continued lateral R hip pain exacerbated by prolonged walking and stair climbing.  Continued to focus on lateral hip strengthening and stretching tasks.  Increased resistance as noted to promote strength and stability.  Emmalene tolerated session well with no adverse reaction.  Upon goal recheck Shellee has met all long term goals with the exception of pain which remains quite high (8/10) with walking more than about 2 minutes.  She still has difficulty walking longer distances for work and daily tasks such as shopping d/t pain.  Given her progress, and limited visits so far, I will extend her POC to address her pain and add in a balance and LE strength goal.  EVAL: Kelce is a 83 y.o. female who presents to clinic with signs and sxs consistent with R lateral hip pain following fall on 3/9.  No imaging available in epic, but pt  reports she has had x-rays of her lumbar spine, R knee, and R hip with no fracture or other significant findings.  Today's exam is consistent with R greater trochanteric pain syndrome.  She has (-) slump but reproduction of back and LE pain with SLR on R so unable to rule out some element of nerve root irritation.  Encouraged her to use a cane from both a safety and de-loading stand point; she is hesitant but states she will.  Kaylana will benefit from skilled PT to address relevant deficits and improve safety and comfort with daily tasks involving standing and ambulation including work.  OBJECTIVE IMPAIRMENTS: Pain, R hip ROM, R hip strength, gait, balance  ACTIVITY LIMITATIONS: walking, standing, bending, squatting, pain at work which requires walking up to 100 yards  PERSONAL FACTORS: See medical history and pertinent history   REHAB POTENTIAL: Good  CLINICAL DECISION MAKING: Evolving/moderate complexity  EVALUATION COMPLEXITY: Moderate   GOALS:   SHORT TERM GOALS: Target date: 03/25/2024   Benny will be >75% HEP compliant to improve carryover between sessions and facilitate independent management of condition  Evaluation: ongoing Goal status: Partially met   LONG TERM GOALS: Target date: 04/22/2024   Latorie will self report >/= 50% decrease in pain from evaluation to improve function in daily tasks  Evaluation/Baseline: 10/10 max pain; 03/30/24 8/10 7/24: 8/10 Goal status: Ongoing   2.  Lagretta will show a >/= 30 pt improvement in LEFS score (MCID is ~11% or 9 pts) as a proxy for functional improvement   Evaluation/Baseline: 21/80 pts 7/24: 34/80 Goal status: MET   3.  Taysha will be able to walk up to 100 yards for her job, not limited by pain  Evaluation/Baseline: limited; 03/30/24 100 yards with moderate discomfort 7/24: MET Goal status: MET   4.  [employer] will improve two minute walk test to 325 feet (MCID 40 ft).  Evaluation/Baseline: 265 ft 7/24: 325' Goal status:  MET  5.  Annel will improve 30'' STS (MCID 2) to >/= 8x (w/ UE?: n) to show improved LE strength and improved transfers   Evaluation/Baseline: 8x  w/ UE? y Goal status: INITIAL  6.  Khushi will be able to stand for >30'' in tandem stance, to show a significant improvement in balance in order to reduce fall risk   Evaluation/Baseline: uanble Goal status: INITIAL   PLAN: PT FREQUENCY: 1-2x/week  PT DURATION: 8 weeks  PLANNED INTERVENTIONS:  [id number]- PT Re-evaluation, 97110-Therapeutic exercises, 97530- Therapeutic  activity, [id number]- Neuromuscular re-education, [id number]- Self Care, [id number]- Manual therapy, [id number]- Gait training, [id number]- Aquatic Therapy, [id number]- Electrical stimulation (manual), [id number]- Vasopneumatic device, [id number]- Traction (mechanical), [id number]- Ionotophoresis 4mg /ml Dexamethasone , Taping, Dry Needling, Joint manipulation, and Spinal manipulation.   Reyes HERO Aasiyah Auerbach PT 05/05/2024, 10:49 AM

## 2024-05-05 ENCOUNTER — Ambulatory Visit: Attending: Orthopaedic Surgery

## 2024-05-05 DIAGNOSIS — M25551 Pain in right hip: Secondary | ICD-10-CM | POA: Diagnosis not present

## 2024-05-05 DIAGNOSIS — M6281 Muscle weakness (generalized): Secondary | ICD-10-CM | POA: Insufficient documentation

## 2024-05-05 DIAGNOSIS — R2689 Other abnormalities of gait and mobility: Secondary | ICD-10-CM | POA: Diagnosis not present

## 2024-05-08 ENCOUNTER — Other Ambulatory Visit

## 2024-05-08 ENCOUNTER — Ambulatory Visit: Admitting: Physical Therapy

## 2024-05-08 ENCOUNTER — Encounter: Payer: Self-pay | Admitting: Physical Therapy

## 2024-05-08 DIAGNOSIS — M25551 Pain in right hip: Secondary | ICD-10-CM | POA: Diagnosis not present

## 2024-05-08 DIAGNOSIS — M6281 Muscle weakness (generalized): Secondary | ICD-10-CM

## 2024-05-08 DIAGNOSIS — R2689 Other abnormalities of gait and mobility: Secondary | ICD-10-CM

## 2024-05-08 NOTE — Therapy (Signed)
 PHYSICAL THERAPY TREATMENT NOTE    Patient Name: Taylor Edwards MRN: [id number] DOB:[birth year], 83 y.o., female Today's Date: 05/08/2024   PT End of Session - 05/08/24 1101     Visit Number 8    Date for PT Re-Evaluation 06/08/24    Authorization Type [employer] MCR - LEFS    Progress Note Due on Visit 10    PT Start Time 1100    PT Stop Time 1141    PT Time Calculation (min) 41 min    Activity Tolerance Patient tolerated treatment well    Behavior During Therapy WFL for tasks assessed/performed             Past Medical History:  Diagnosis Date   Anemia    no current med.   Arthritis    knees   Diabetes mellitus    GERD (gastroesophageal reflux disease)    no current med.   Gout    Hypercholesteremia    Hypertension    has been on med. x 10 yrs.   Hypothyroid    IBS (irritable bowel syndrome)    Post-nasal drip    current cough   Sickle cell trait (HCC)    Urinary urgency    Past Surgical History:  Procedure Laterality Date   ABDOMINAL HYSTERECTOMY  1995   BUNIONECTOMY  08/25/2011   Procedure: [name];  Surgeon: [name], MD;  Location: [facility];  Service: Orthopedics;  Laterality: Right;   BUNIONECTOMY Left 08/04/2022   Procedure: LEFT FOOT BUNIONECTOMY;  Surgeon: [name], MD;  Location: [facility];  Service: Orthopedics;  Laterality: Left;   CATARACT EXTRACTION  2016   both eyes   COLONOSCOPY W/ BIOPSIES AND POLYPECTOMY     DILATION AND CURETTAGE OF UTERUS     INJECTION KNEE  08/25/2011   Procedure: KNEE INJECTION;  Surgeon: [name], MD;  Location: [facility];  Service: Orthopedics;  Laterality: Bilateral;   KNEE ARTHROSCOPY  12/11/2008   bilat., with chondroplasty   KNEE JOINT MANIPULATION  10/30/2009   bilat.   OPEN REDUCTION INTERNAL FIXATION (ORIF) DISTAL RADIAL FRACTURE Left 11/17/2014   Procedure: OPEN REDUCTION INTERNAL FIXATION (ORIF) LEFT DISTAL RADIUS  FRACTURE;  Surgeon: [name],  MD;  Location: [facility];  Service: Orthopedics;  Laterality: Left;   SHOULDER ARTHROSCOPY WITH ROTATOR CUFF REPAIR AND SUBACROMIAL DECOMPRESSION Right 12/15/2022   Procedure: SHOULDER ARTHROSCOPY WITH ROTATOR CUFF REPAIR AND SUBACROMIAL DECOMPRESSION;  Surgeon: [name], MD;  Location: [facility];  Service: Orthopedics;  Laterality: Right;   TOTAL KNEE ARTHROPLASTY Bilateral 09/26/2009   bilat.   Patient Active Problem List   Diagnosis Date Noted   Posterior vitreous detachment, both eyes 03/26/2022   Diabetes mellitus without complication (HCC) 03/26/2022   Pseudophakia, both eyes 03/26/2022   History of total knee replacement, left 04/03/2019   History of total knee arthroplasty, right 04/03/2019   DERMATOFIBROMA 10/26/2007   ANEMIA, CHRONIC 10/26/2007   SICKLE CELL TRAIT 10/26/2007   ESSENTIAL HYPERTENSION 10/26/2007   GASTROESOPHAGEAL REFLUX DISEASE 10/26/2007   GASTRITIS 10/26/2007   HIATAL HERNIA 10/26/2007   CONSTIPATION, CHRONIC 10/26/2007   GUAIAC POSITIVE STOOL 10/26/2007    PCP: Charlott Dorn LABOR, MD  REFERRING PROVIDER: Charlott Dorn A, *  THERAPY DIAG:  Pain in right hip  Muscle weakness  Other abnormalities of gait and mobility  REFERRING DIAG: lateral hip pain  Rationale for Evaluation and Treatment:  Rehabilitation  SUBJECTIVE:      PERTINENT  PAST HISTORY:  Anemia, bil TKA, DM, R R/C repair      PRECAUTIONS: None  WEIGHT BEARING RESTRICTIONS No  FALLS:  Has patient fallen in last 6 months? Yes, Number of falls: 1 - March 9th, stumbled on a pipe in the sidewalk  MOI/History of condition:  Onset date: March 9th  SUBJECTIVE STATEMENT   05/08/2024: Pt reports that her hip is improving slowly.  She still has difficulty with walking longer distances.  EVAL: Taylor Edwards is a 83 y.o. female who presents to clinic with chief complaint of R hip and LE pain which started after she stumbled over a pipe in the sidewalk and  fell down when going into a store.  Since this time she has had R lateral and posterior hip pain which she reports can radiate below the knee.  The pain increases with standing and walking.  She was told that she had an exacerbation of R hip bursitis but she feels it may be more of a sciatic pain.  She has had imaging of everything including at least R hip and knee which were all clear for fracture.  She reports some intermittent n/t in bil toes but she is not positive if this is new or not.   Red flags:  denies BB changes and saddle anesthesia  Pain:  Are you having pain? 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  Assistive Device: SPC as needed  Patient Goals/Specific Activities: reduce pain and improve ability to walk for longer periods   OBJECTIVE:   DIAGNOSTIC FINDINGS:  Pt reports x-ray completed for low back, R hip, and knees were clear  GENERAL OBSERVATION/GAIT: Slow antalgic gait with reduced time in stance on R  SENSATION: Light touch: Appears intact subjective report of n/t in bil toes at times  PALPATION: Significant TTP of R GT  MUSCLE LENGTH: Hamstrings: Right no restriction; Left no restriction  LE MMT:  MMT Right (Eval) Left (Eval)  Hip flexion (L2, L3) 3+* 4  Knee extension (L3)    Knee flexion    Hip abduction 3+* 4  Hip extension    Hip external rotation 4 4+  Hip internal rotation 4 4+  Hip adduction    Ankle dorsiflexion (L4)    Ankle plantarflexion (S1)    Ankle inversion    Ankle eversion    Great Toe ext (L5)    Grossly     (Blank rows = not tested, score listed is out of 5 possible points.  N = WNL, D = diminished, C = clear for gross weakness with myotome testing, * = concordant pain with  testing)  LE ROM:  ROM Right (Eval) Left (Eval)  Hip flexion 90 90  Hip extension    Hip abduction    Hip adduction    Hip internal rotation n n  Hip external rotation n n  Knee extension    Knee flexion    Ankle dorsiflexion    Ankle plantarflexion    Ankle inversion    Ankle eversion     (Blank rows = not tested, N = WNL, * = concordant pain with testing)  SPECIAL TESTS:  FADIR: (~) resisted  FABER: (+) R lateral hip Slump (-) bil SLR (+) R (-) L  PATIENT SURVEYS:  LEFS: 21/80  Functional Tests  Eval    2 MWT 265'    30'' STS: 8x  UE used? Y    Progressive balance screen (highest level completed for >/= 10''):  Feet together: 10'' Semi Tandem: R in rear 10'', L in rear 10'' Tandem: R in rear unable, L in rear                                                     TODAY'S TREATMENT: [facility] Adult PT Treatment:                                                DATE: 05/08/24 Therapeutic Exercise: Nustep L4 8 min Seated hip abd - blue TB Knee ext machine - 10# - 2x10 HS curl machine - 2x10 - 20#  Therapeutic Activity: 2 MWT: 319' STS - 3x5 4'' step up - 10x fwd Hip abd machine - 25# - 10x ea   [facility] Adult PT Treatment  04/13/2024:  Therapeutic Activity  collecting information for goals, checking progress, and reviewing with patient  [facility] Adult PT Treatment:                                                DATE: 03/30/24 Therapeutic Exercise: Nustep L4 8 min Neuromuscular re-ed: Supine hip fallouts GTB 15x B, 15/15 unilaterally Bridge against GTB 15x FAQs with adduction 15x R clamshells GTB 15x P-ball curl ups 15x B, 15/15 unilaterally Therapeutic Activity: Seated R hamstring stretch 30s x2 Supine R hip flexor stretch  R ITB stretch 30s x2 (manual)  [facility] Adult PT Treatment:                                                DATE: 03/16/24 Therapeutic Exercise: Nustep L4 8 min Neuromuscular re-ed: Supine hip fallouts RTB 15x B, 15/15  unilaterally Bridge against RTB 15x FAQs with adduction 15x P-ball curl ups 15x B, 15/15 unilaterally Therapeutic Activity: Seated hamstring stretch 30s x2 Supine QL stretch 30s x2  [facility] Adult PT Treatment  03/04/2024:  Therapeutic Exercise: nu-step L5 43m while taking subjective and planning session with patient Static hip abd - supine - 2x10 - 10'' ea Static hip abd in standing - 2x10 - 10'' ea Slow bridge - 10x Hip abd slider - RTB at ankle - 10x ea - slow     HOME EXERCISE PROGRAM: Access Code: [email or web address] URL: [email or web address] Date: 04/13/2024 Prepared by: Helene Gasmen  Exercises - Sit to Stand with Armchair  - 1 x daily - 7 x weekly - 3 sets - 10 reps - Standing Tandem Balance with Counter Support  - 1 x daily - 7 x weekly - 3 sets - 45'' hold  Treatment priorities   Eval        Progressive lateral hip loading         gait        endurance  Encouragement to use SPC                  ASSESSMENT:  CLINICAL IMPRESSION:   Maree tolerated session well with no adverse reaction.  Hip continues to show good improvement.  She is now able to complete her work with minimal pain and was more limited by LBP than hip pain today.  Plan on D/C next visit.   EVAL: Loney is a 83 y.o. female who presents to clinic with signs and sxs consistent with R lateral hip pain following fall on 3/9.  No imaging available in epic, but pt reports she has had x-rays of her lumbar spine, R knee, and R hip with no fracture or other significant findings.  Today's exam is consistent with R greater trochanteric pain syndrome.  She has (-) slump but reproduction of back and LE pain with SLR on R so unable to rule out some element of nerve root irritation.  Encouraged her to use a cane from both a safety and de-loading stand point; she is hesitant but states she will.  Archie will benefit from skilled PT to address relevant deficits and improve safety and comfort with daily tasks  involving standing and ambulation including work.  OBJECTIVE IMPAIRMENTS: Pain, R hip ROM, R hip strength, gait, balance  ACTIVITY LIMITATIONS: walking, standing, bending, squatting, pain at work which requires walking up to 100 yards  PERSONAL FACTORS: See medical history and pertinent history   REHAB POTENTIAL: Good  CLINICAL DECISION MAKING: Evolving/moderate complexity  EVALUATION COMPLEXITY: Moderate   GOALS:   SHORT TERM GOALS: Target date: 03/25/2024   Cherrise will be >75% HEP compliant to improve carryover between sessions and facilitate independent management of condition  Evaluation: ongoing Goal status: Partially met   LONG TERM GOALS: Target date: 04/22/2024 extended to 9/18   Pollie will self report >/= 50% decrease in pain from evaluation to improve function in daily tasks  Evaluation/Baseline: 10/10 max pain; 03/30/24 8/10 7/24: 8/10 Goal status: Ongoing   2.  Khali will show a >/= 30 pt improvement in LEFS score (MCID is ~11% or 9 pts) as a proxy for functional improvement   Evaluation/Baseline: 21/80 pts 7/24: 34/80 Goal status: MET   3.  Troyce will be able to walk up to 100 yards for her job, not limited by pain  Evaluation/Baseline: limited; 03/30/24 100 yards with moderate discomfort 7/24: MET Goal status: MET   4.  Leonora will improve two minute walk test to 325 feet (MCID 40 ft).  Evaluation/Baseline: 265 ft 7/24: 325' Goal status: MET  5.  Kattaleya will improve 30'' STS (MCID 2) to >/= 8x (w/ UE?: n) to show improved LE strength and improved transfers   Evaluation/Baseline: 8x  w/ UE? y Goal status: INITIAL  6.  Kirra will be able to stand for >30'' in tandem stance, to show a significant improvement in balance in order to reduce fall risk   Evaluation/Baseline: uanble Goal status: INITIAL   PLAN: PT FREQUENCY: 1-2x/week  PT DURATION: 8 weeks  PLANNED INTERVENTIONS:  [id number]- PT Re-evaluation, 97110-Therapeutic exercises, 97530-  Therapeutic activity, [id number]- Neuromuscular re-education, 97535- Self Care, [id number]- Manual therapy, [id number]- Gait training, [id number]- Aquatic Therapy, [phone]- Electrical stimulation (manual), [id number]- Vasopneumatic device, [id number]- Traction (mechanical), [id number]- Ionotophoresis 4mg /ml Dexamethasone , Taping, Dry Needling, Joint manipulation, and Spinal manipulation.   Josten Warmuth E Meshawn Oconnor PT 05/08/2024, 11:45 AM

## 2024-05-11 ENCOUNTER — Ambulatory Visit: Admitting: Physical Therapy

## 2024-05-11 ENCOUNTER — Encounter: Payer: Self-pay | Admitting: Physical Therapy

## 2024-05-11 DIAGNOSIS — M6281 Muscle weakness (generalized): Secondary | ICD-10-CM

## 2024-05-11 DIAGNOSIS — R2689 Other abnormalities of gait and mobility: Secondary | ICD-10-CM

## 2024-05-11 DIAGNOSIS — M25551 Pain in right hip: Secondary | ICD-10-CM | POA: Diagnosis not present

## 2024-05-11 NOTE — Therapy (Signed)
 PHYSICAL THERAPY DISCHARGE SUMMARY  Visits from Start of Care: 9  Current functional level related to goals / functional outcomes: See assessment/goals   Remaining deficits: See assessment/goals   Education / Equipment: HEP and D/C plans  Patient agrees to discharge. Patient goals were partially met. Patient is being discharged due to abe to complete HEP without supervision.    Patient Name: Taylor Edwards MRN: [id number] DOB:08/27/1941, 83 y.o., female Today's Date: 05/11/2024   PT End of Session - 05/11/24 1102     Visit Number 9    Date for PT Re-Evaluation 06/08/24    Authorization Type [employer] MCR - LEFS    Progress Note Due on Visit 10    PT Start Time 1100    PT Stop Time 1138    PT Time Calculation (min) 38 min    Activity Tolerance Patient tolerated treatment well    Behavior During Therapy WFL for tasks assessed/performed             Past Medical History:  Diagnosis Date   Anemia    no current med.   Arthritis    knees   Diabetes mellitus    GERD (gastroesophageal reflux disease)    no current med.   Gout    Hypercholesteremia    Hypertension    has been on med. x 10 yrs.   Hypothyroid    IBS (irritable bowel syndrome)    Post-nasal drip    current cough   Sickle cell trait (HCC)    Urinary urgency    Past Surgical History:  Procedure Laterality Date   ABDOMINAL HYSTERECTOMY  1995   BUNIONECTOMY  08/25/2011   Procedure: [name];  Surgeon: [name], MD;  Location: [facility];  Service: Orthopedics;  Laterality: Right;   BUNIONECTOMY Left 08/04/2022   Procedure: LEFT FOOT BUNIONECTOMY;  Surgeon: [name], MD;  Location: [facility];  Service: Orthopedics;  Laterality: Left;   CATARACT EXTRACTION  2016   both eyes   COLONOSCOPY W/ BIOPSIES AND POLYPECTOMY     DILATION AND CURETTAGE OF UTERUS     INJECTION KNEE  08/25/2011   Procedure: KNEE INJECTION;  Surgeon: [name], MD;  Location: [facility];  Service: Orthopedics;  Laterality: Bilateral;   KNEE ARTHROSCOPY  12/11/2008   bilat., with chondroplasty   KNEE JOINT MANIPULATION  10/30/2009   bilat.   OPEN REDUCTION INTERNAL FIXATION (ORIF) DISTAL RADIAL FRACTURE Left 11/17/2014   Procedure: OPEN REDUCTION INTERNAL FIXATION (ORIF) LEFT DISTAL RADIUS  FRACTURE;  Surgeon: [name], MD;  Location: [facility];  Service: Orthopedics;  Laterality: Left;   SHOULDER ARTHROSCOPY WITH ROTATOR CUFF REPAIR AND SUBACROMIAL DECOMPRESSION Right 12/15/2022   Procedure: SHOULDER ARTHROSCOPY WITH ROTATOR CUFF REPAIR AND SUBACROMIAL DECOMPRESSION;  Surgeon: [name], MD;  Location:  SUR[facility]ERY [facility];  Service: Orthopedics;  Laterality: Right;   TOTAL KNEE ARTHROPLASTY Bilateral 09/26/2009   bilat.   Patient Active Problem List   Diagnosis Date Noted   Posterior vitreous detachment, both eyes 03/26/2022   Diabetes mellitus without complication (HCC) 03/26/2022   Pseudophakia, both eyes 03/26/2022   History of total knee replacement, left 04/03/2019   History of total knee arthroplasty, right 04/03/2019   DERMATOFIBROMA 10/26/2007   ANEMIA, CHRONIC 10/26/2007   SICKLE CELL TRAIT 10/26/2007   ESSENTIAL HYPERTENSION 10/26/2007   GASTROESOPHAGEAL REFLUX DISEASE 10/26/2007   GASTRITIS 10/26/2007   HIATAL HERNIA 10/26/2007   CONSTIPATION, CHRONIC 10/26/2007   GUAIAC POSITIVE  STOOL 10/26/2007    PCP: Charlott Dorn LABOR, MD  REFERRING PROVIDER: Charlott Dorn A, *  THERAPY DIAG:  Pain in right hip  Muscle weakness  Other abnormalities of gait and mobility  REFERRING DIAG: lateral hip pain  Rationale for Evaluation and Treatment:  Rehabilitation  SUBJECTIVE:      PERTINENT PAST HISTORY:  Anemia, bil TKA, DM, R R/C repair      PRECAUTIONS: None  WEIGHT BEARING RESTRICTIONS No  FALLS:  Has patient fallen in last 6 months? Yes, Number of falls: 1 - March 9th, stumbled on a pipe in the  sidewalk  MOI/History of condition:  Onset date: March 9th  SUBJECTIVE STATEMENT   05/11/2024: Pt reports that she still does not feel that her hip is back to normal and she feels this is unlikely to improve further.  EVAL: Taylor Edwards is a 83 y.o. female who presents to clinic with chief complaint of R hip and LE pain which started after she stumbled over a pipe in the sidewalk and fell down when going into a store.  Since this time she has had R lateral and posterior hip pain which she reports can radiate below the knee.  The pain increases with standing and walking.  She was told that she had an exacerbation of R hip bursitis but she feels it may be more of a sciatic pain.  She has had imaging of everything including at least R hip and knee which were all clear for fracture.  She reports some intermittent n/t in bil toes but she is not positive if this is new or not.   Red flags:  denies BB changes and saddle anesthesia  Pain:  Are you having pain? 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  Assistive Device: SPC as needed  Patient Goals/Specific Activities: reduce pain and improve ability to walk for longer periods   OBJECTIVE:   DIAGNOSTIC FINDINGS:  Pt reports x-ray completed for low back, R hip, and knees were clear  GENERAL OBSERVATION/GAIT: Slow antalgic gait with reduced time in stance on R  SENSATION: Light touch: Appears intact subjective report of n/t in bil toes at times  PALPATION: Significant TTP of R GT  MUSCLE LENGTH: Hamstrings: Right no restriction; Left no restriction  LE MMT:  MMT Right (Eval) Left (Eval)  Hip flexion (L2, L3) 3+* 4  Knee extension (L3)    Knee flexion    Hip abduction 3+* 4   Hip extension    Hip external rotation 4 4+  Hip internal rotation 4 4+  Hip adduction    Ankle dorsiflexion (L4)    Ankle plantarflexion (S1)    Ankle inversion    Ankle eversion    Great Toe ext (L5)    Grossly     (Blank rows = not tested, score listed is out of 5 possible points.  N = WNL, D = diminished, C = clear for gross weakness with myotome testing, * = concordant pain with testing)  LE ROM:  ROM Right (Eval) Left (Eval)  Hip flexion 90 90  Hip extension    Hip abduction    Hip adduction    Hip internal rotation n n  Hip external rotation n n  Knee extension  Knee flexion    Ankle dorsiflexion    Ankle plantarflexion    Ankle inversion    Ankle eversion     (Blank rows = not tested, N = WNL, * = concordant pain with testing)  SPECIAL TESTS:  FADIR: (~) resisted  FABER: (+) R lateral hip Slump (-) bil SLR (+) R (-) L  PATIENT SURVEYS:  LEFS: 21/80  Functional Tests  Eval    2 MWT 265'    30'' STS: 8x  UE used? Y    Progressive balance screen (highest level completed for >/= 10''):  Feet together: 10'' Semi Tandem: R in rear 10'', L in rear 10'' Tandem: R in rear unable, L in rear                                                     TODAY'S TREATMENT:  [facility] Adult PT Treatment:                                                DATE: 05/11/24 Therapeutic Exercise: Nustep L4 8 min Seated hip abd - blue TB Seated hip adduction Resisted knee ext HS curl  Therapeutic Activity: Checking goals and reviewing with pt   [facility] Adult PT Treatment  04/13/2024:  Therapeutic Activity  collecting information for goals, checking progress, and reviewing with patient  [facility] Adult PT Treatment:                                                DATE: 03/30/24 Therapeutic Exercise: Nustep L4 8 min Neuromuscular re-ed: Supine hip fallouts GTB 15x B, 15/15 unilaterally Bridge against GTB 15x FAQs with adduction 15x R clamshells GTB 15x P-ball  curl ups 15x B, 15/15 unilaterally Therapeutic Activity: Seated R hamstring stretch 30s x2 Supine R hip flexor stretch  R ITB stretch 30s x2 (manual)  [facility] Adult PT Treatment:                                                DATE: 03/16/24 Therapeutic Exercise: Nustep L4 8 min Neuromuscular re-ed: Supine hip fallouts RTB 15x B, 15/15 unilaterally Bridge against RTB 15x FAQs with adduction 15x P-ball curl ups 15x B, 15/15 unilaterally Therapeutic Activity: Seated hamstring stretch 30s x2 Supine QL stretch 30s x2  [facility] Adult PT Treatment  03/04/2024:  Therapeutic Exercise: nu-step L5 18m while taking subjective and planning session with patient Static hip abd - supine - 2x10 - 10'' ea Static hip abd in standing - 2x10 - 10'' ea Slow bridge - 10x Hip abd slider - RTB at ankle - 10x ea - slow     HOME EXERCISE PROGRAM: Access Code: [email or web address] URL: [email or web address] Date: 04/13/2024 Prepared by: Helene Gasmen  Exercises - Sit to Stand with Armchair  - 1 x daily - 7 x weekly - 3 sets - 10 reps - Standing Tandem Balance with Counter Support  - 1 x daily - 7  x weekly - 3 sets - 45'' hold  Treatment priorities   Eval        Progressive lateral hip loading         gait        endurance        Encouragement to use SPC                  ASSESSMENT:  CLINICAL IMPRESSION:   05/11/2024:  Taylor Edwards has progressed well functionally but continues to have R hip pain which can be severe at times.  The pain is variable but can reach an 8/10 with activity.  On an average day it is ~6/10 vs 10/10 on evaluation.  She has met all long term goals with the exception of her pain goal.  She has improved her ambulation, transfers, balance, and LEFS scores.  She is now able to walk for work, with a break, without too much trouble.  She will be d/c'd with HEP; she is in agreement with plan.   EVAL: Taylor Edwards is a 83 y.o. female who presents to clinic with signs and sxs consistent with R  lateral hip pain following fall on 3/9.  No imaging available in epic, but pt reports she has had x-rays of her lumbar spine, R knee, and R hip with no fracture or other significant findings.  Today's exam is consistent with R greater trochanteric pain syndrome.  She has (-) slump but reproduction of back and LE pain with SLR on R so unable to rule out some element of nerve root irritation.  Encouraged her to use a cane from both a safety and de-loading stand point; she is hesitant but states she will.  Taylor Edwards will benefit from skilled PT to address relevant deficits and improve safety and comfort with daily tasks involving standing and ambulation including work.  OBJECTIVE IMPAIRMENTS: Pain, R hip ROM, R hip strength, gait, balance  ACTIVITY LIMITATIONS: walking, standing, bending, squatting, pain at work which requires walking up to 100 yards  PERSONAL FACTORS: See medical history and pertinent history   REHAB POTENTIAL: Good  CLINICAL DECISION MAKING: Evolving/moderate complexity  EVALUATION COMPLEXITY: Moderate   GOALS:   SHORT TERM GOALS: Target date: 03/25/2024   Taylor Edwards will be >75% HEP compliant to improve carryover between sessions and facilitate independent management of condition  Evaluation: ongoing Goal status: Partially met   LONG TERM GOALS: Target date: 04/22/2024 extended to 9/18   Taylor Edwards will self report >/= 50% decrease in pain from evaluation to improve function in daily tasks  Evaluation/Baseline: 10/10 max pain; 03/30/24 8/10 7/24: 8/10 8/21: 8/10 Goal status: NOT MET   2.  Taylor Edwards will show a >/= 30 pt improvement in LEFS score (MCID is ~11% or 9 pts) as a proxy for functional improvement   Evaluation/Baseline: 21/80 pts 7/24: 34/80 Goal status: MET   3.  Taylor Edwards will be able to walk up to 100 yards for her job, not limited by pain  Evaluation/Baseline: limited; 03/30/24 100 yards with moderate discomfort 7/24: MET Goal status: MET   4.  Taylor Edwards will  improve two minute walk test to 325 feet (MCID 40 ft).  Evaluation/Baseline: 265 ft 7/24: 325' Goal status: MET  5.  Taylor Edwards will improve 30'' STS (MCID 2) to >/= 8x (w/ UE?: n) to show improved LE strength and improved transfers   Evaluation/Baseline: 8x  w/ UE? Y 8/21: 8x no UE support Goal status: MET  6.  Taylor Edwards will be able to stand  for >30'' in tandem stance, to show a significant improvement in balance in order to reduce fall risk   Evaluation/Baseline: unable 8/21: MET Goal status: MET   PLAN: PT FREQUENCY: 1-2x/week  PT DURATION: 8 weeks  PLANNED INTERVENTIONS:  97164- PT Re-evaluation, 97110-Therapeutic exercises, 97530- Therapeutic activity, 97112- Neuromuscular re-education, 97535- Self Care, 02859- Manual therapy, [id number]- Gait training, [id number]- Aquatic Therapy, [phone]- Electrical stimulation (manual), [id number]- Vasopneumatic device, [id number]- Traction (mechanical), [id number]- Ionotophoresis 4mg /ml Dexamethasone , Taping, Dry Needling, Joint manipulation, and Spinal manipulation.   Christean Silvestri E Jenisha Faison PT 05/11/2024, 11:38 AM

## 2024-06-14 DIAGNOSIS — H02833 Dermatochalasis of right eye, unspecified eyelid: Secondary | ICD-10-CM | POA: Diagnosis not present

## 2024-06-14 DIAGNOSIS — H02836 Dermatochalasis of left eye, unspecified eyelid: Secondary | ICD-10-CM | POA: Diagnosis not present

## 2024-06-14 DIAGNOSIS — H43813 Vitreous degeneration, bilateral: Secondary | ICD-10-CM | POA: Diagnosis not present

## 2024-06-14 DIAGNOSIS — E119 Type 2 diabetes mellitus without complications: Secondary | ICD-10-CM | POA: Diagnosis not present

## 2024-06-26 DIAGNOSIS — Z789 Other specified health status: Secondary | ICD-10-CM | POA: Diagnosis not present

## 2024-06-26 DIAGNOSIS — R208 Other disturbances of skin sensation: Secondary | ICD-10-CM | POA: Diagnosis not present

## 2024-06-26 DIAGNOSIS — L820 Inflamed seborrheic keratosis: Secondary | ICD-10-CM | POA: Diagnosis not present

## 2024-06-26 DIAGNOSIS — L821 Other seborrheic keratosis: Secondary | ICD-10-CM | POA: Diagnosis not present

## 2024-06-26 DIAGNOSIS — L538 Other specified erythematous conditions: Secondary | ICD-10-CM | POA: Diagnosis not present

## 2024-07-03 DIAGNOSIS — Z01818 Encounter for other preprocedural examination: Secondary | ICD-10-CM | POA: Diagnosis not present

## 2024-07-03 DIAGNOSIS — H02422 Myogenic ptosis of left eyelid: Secondary | ICD-10-CM | POA: Diagnosis not present

## 2024-07-03 DIAGNOSIS — H02834 Dermatochalasis of left upper eyelid: Secondary | ICD-10-CM | POA: Diagnosis not present

## 2024-07-03 DIAGNOSIS — H02421 Myogenic ptosis of right eyelid: Secondary | ICD-10-CM | POA: Diagnosis not present

## 2024-07-03 DIAGNOSIS — H02412 Mechanical ptosis of left eyelid: Secondary | ICD-10-CM | POA: Diagnosis not present

## 2024-07-03 DIAGNOSIS — H02831 Dermatochalasis of right upper eyelid: Secondary | ICD-10-CM | POA: Diagnosis not present

## 2024-07-03 DIAGNOSIS — H02423 Myogenic ptosis of bilateral eyelids: Secondary | ICD-10-CM | POA: Diagnosis not present

## 2024-07-03 DIAGNOSIS — H53483 Generalized contraction of visual field, bilateral: Secondary | ICD-10-CM | POA: Diagnosis not present

## 2024-07-03 DIAGNOSIS — H57813 Brow ptosis, bilateral: Secondary | ICD-10-CM | POA: Diagnosis not present

## 2024-07-03 DIAGNOSIS — H02411 Mechanical ptosis of right eyelid: Secondary | ICD-10-CM | POA: Diagnosis not present

## 2024-07-03 DIAGNOSIS — H02413 Mechanical ptosis of bilateral eyelids: Secondary | ICD-10-CM | POA: Diagnosis not present

## 2024-07-03 DIAGNOSIS — H0279 Other degenerative disorders of eyelid and periocular area: Secondary | ICD-10-CM | POA: Diagnosis not present

## 2024-07-17 DIAGNOSIS — H53483 Generalized contraction of visual field, bilateral: Secondary | ICD-10-CM | POA: Diagnosis not present

## 2024-10-02 NOTE — Patient Outreach (Signed)
 Received the following message from PCP office:   Erskin Browning,   I called and spoke with Ms. Sandy and she said she would appreciate a call from you to help her get connected to a SNP case manager for resources.   Thank you!!  Yolande Baptist, PharmD, CPP  [facility]  An unsuccessful outreach was attempted to connect patient with health plan. Patient has [employer] C-SNP. HIPAA compliant voicemail left requesting return call.  Browning Finlay, RN MSN [place]  [facility] Population Health RN Care Manager Direct Dial: [phone]  Fax: [phone]

## 2024-10-05 ENCOUNTER — Other Ambulatory Visit: Payer: Self-pay

## 2024-10-05 DIAGNOSIS — Z139 Encounter for screening, unspecified: Secondary | ICD-10-CM

## 2024-10-05 NOTE — Patient Outreach (Signed)
 Complex Care Management   Visit Note  10/05/2024  Name:  Taylor Edwards MRN: [id number] DOB: 10/23/1940  Situation: Referral received for Complex Care Management related to resource needs reported by PCP office I obtained verbal consent from Patient.  Visit completed with Patient  on the phone  Background:   Past Medical History:  Diagnosis Date   Anemia    no current med.   Arthritis    knees   Diabetes mellitus    GERD (gastroesophageal reflux disease)    no current med.   Gout    Hypercholesteremia    Hypertension    has been on med. x 10 yrs.   Hypothyroid    IBS (irritable bowel syndrome)    Post-nasal drip    current cough   Sickle cell trait    Urinary urgency     Assessment: Patient Reported Symptoms:  Cognitive Cognitive Status: Able to follow simple commands, Alert and oriented to person, place, and time, Normal speech and language skills Cognitive/Intellectual Conditions Management [RPT]: None reported or documented in medical history or problem list   Health Maintenance Behaviors: Annual physical exam, Exercise, Healthy diet Health Facilitated by: Healthy diet, Pain control, Rest  Neurological Neurological Review of Symptoms: Other: [place] Neurological Symptoms/Conditions [RPT]: tingling bilateral feet due to neuropathy Neurological Management Strategies: Coping strategies, Medication therapy, Routine screening  HEENT HEENT Symptoms Reported: Other: HEENT Management Strategies: Coping strategies, Routine screening    Cardiovascular Cardiovascular Symptoms Reported: No symptoms reported Does patient have uncontrolled Hypertension?: Yes Is patient checking Blood Pressure at home?: Yes Patient's Recent BP reading at home: 138/68 Cardiovascular Management Strategies: Coping strategies, Medication therapy, Routine screening  Respiratory Respiratory Symptoms Reported: No symptoms reported    Endocrine Endocrine Symptoms Reported: No symptoms reported Is  patient diabetic?: Yes Is patient checking blood sugars at home?: Yes List most recent blood sugar readings, include date and time of day: Checking when needed when they feel high. Patient reports this does not happen too often    Gastrointestinal Gastrointestinal Symptoms Reported: Diarrhea Additional Gastrointestinal Details: Patient reports a good appetite diet. She reports last BM was last night. Patient reports she was up all night with diarrhea. She reports she took a laxative the night before. Gastrointestinal Management Strategies: Coping strategies, Medication therapy    Genitourinary Genitourinary Symptoms Reported: Urgency, Frequency Additional Genitourinary Details: Frequency and urgency due to torsemide Genitourinary Management Strategies: Coping strategies, Medication therapy  Integumentary Integumentary Symptoms Reported: No symptoms reported    Musculoskeletal Musculoskelatal Symptoms Reviewed: Back pain, Joint pain, Weakness Additional Musculoskeletal Details: Patient reports chronic pain in her lower back and bilateral knees. Pain is worse when she first gets up in the morning. Patient reports she has medication for pain relief to take sparingly Musculoskeletal Management Strategies: Coping strategies, Medication therapy, Routine screening Musculoskeletal Comment: Patient reports 2 falls in the last year. She did work with PT following fall. Patient reports she tries to go to [employer] to get regular exercise. Falls in the past year?: Yes Number of falls in past year: 2 or more Was there an injury with Fall?: Yes (Tear in R rotator cuff - no longer giving her issues per patient) Fall Risk Category Calculator: 3 Patient Fall Risk Level: High Fall Risk Patient at Risk for Falls Due to: History of fall(s) Fall risk Follow up: Falls evaluation completed, Education provided, Falls prevention discussed  Psychosocial Psychosocial Symptoms Reported: No symptoms  reported     Quality of Family Relationships: helpful,  involved, supportive Do you feel physically threatened by others?: No    10/05/2024    PHQ2-9 Depression Screening   Little interest or pleasure in doing things Not at all  Feeling down, depressed, or hopeless Not at all  PHQ-2 - Total Score 0  Trouble falling or staying asleep, or sleeping too much    Feeling tired or having little energy    Poor appetite or overeating     Feeling bad about yourself - or that you are a failure or have let yourself or your family down    Trouble concentrating on things, such as reading the newspaper or watching television    Moving or speaking so slowly that other people could have noticed.  Or the opposite - being so fidgety or restless that you have been moving around a lot more than usual    Thoughts that you would be better off dead, or hurting yourself in some way    PHQ2-9 Total Score    If you checked off any problems, how difficult have these problems made it for you to do your work, take care of things at home, or get along with other people    Depression Interventions/Treatment      There were no vitals filed for this visit. Pain Scale: 0-10 Pain Score: 0-No pain  Medications Reviewed Today     Reviewed by Arno Rosaline SQUIBB, RN (Registered Nurse) on 10/05/24 at 1026  Med List Status: <None>   Medication Order Taking? Sig Documenting Provider Last Dose Status Informant  Ascorbic Acid (VITAMIN C) 1000 MG tablet [id number] Yes Take 1,000 mg by mouth in the morning and at bedtime. [provider]  Active Self  atorvastatin (LIPITOR) 10 MG tablet [id number] Yes Take 10 mg by mouth at bedtime. [provider]  Active Self  clobetasol cream (TEMOVATE) 0.05 % [id number]  Apply 1 Application topically 2 (two) times daily as needed (irritation).  Patient not taking: Reported on 10/05/2024   [provider]  Active Self  colchicine 0.6 MG tablet [id number]  Take 0.6 mg  by mouth 2 (two) times daily as needed (gout pain).  Patient not taking: Reported on 10/05/2024   [provider]  Active Self  docusate sodium (COLACE) 100 MG capsule [id number] Yes Take 100 mg by mouth at bedtime. [provider]  Active Self  empagliflozin  (JARDIANCE ) 10 MG TABS tablet [id number]  Take 10 mg by mouth daily.  Patient not taking: Reported on 10/05/2024   [provider]  Active Self  empagliflozin  (JARDIANCE ) 25 MG TABS tablet [id number] Yes Take 1 tablet (25 mg total) by mouth daily.   Active   Flaxseed, Linseed, (FLAXSEED OIL) 1000 MG CAPS [id number]  Take 1,000 mg by mouth at bedtime.  Patient not taking: Reported on 10/05/2024   [provider]  Active Self  fluticasone (FLONASE) 50 MCG/ACT nasal spray [id number] Yes Place 2 sprays into both nostrils daily. [provider]  Active Self  gabapentin (NEURONTIN) 100 MG capsule [id number] Yes Take 200 mg by mouth 2 (two) times daily. [provider]  Active Self  Garlic 100 MG TABS [id number] Yes Take 100 mg by mouth daily. [provider]  Active Self  HYDROcodone -acetaminophen  (NORCO) 7.5-325 MG tablet [id number] Yes Take 1 tablet by mouth every 6 (six) hours as needed for moderate pain (pain score 4-6). [provider]  Active   liothyronine (CYTOMEL) 5 MCG tablet [id number] Yes Take 5 mcg by mouth daily. [provider]  Active Self  loratadine (CLARITIN) 10 MG tablet [id number] Yes Take 10 mg by mouth daily as needed for allergies. [provider]  Active Self  losartan (COZAAR) 50 MG tablet [id number] Yes Take 50 mg by mouth daily. [provider]  Active Self  Multiple Vitamin (MULTIVITAMIN) tablet [id number] Yes Take 1 tablet by mouth daily. [provider]  Active Self  oxyCODONE  (ROXICODONE ) 5 MG immediate release tablet [id number]  Take 1 tablet (5 mg total) by mouth every 4 (four) hours as needed for severe pain.  Patient not  taking: Reported on 10/05/2024   Brendan Gauze, PA-C  Consider Medication Status and Discontinue (Discontinued by provider)   Semaglutide,0.25 or 0.5MG /DOS, (OZEMPIC, 0.25 OR 0.5 MG/DOSE,) 2 MG/1.5ML SOPN [id number] Yes Inject 0.5 mg into the skin once a week. [provider]  Active   senna-docusate (SENOKOT-S) 8.6-50 MG tablet [id number] Yes Take 2 tablets by mouth at bedtime. [provider]  Active Self  torsemide (DEMADEX) 10 MG tablet [id number] Yes Take 10 mg by mouth every evening. [provider]  Active Self  Med List Note Ted Nathanel BROCKS, RN 08/21/11 1739): ADVANTAGE DIGESTIVE SUPPORT, DIABETIC SUPPORT NATURAL MED.            Recommendation:   Referral to: BSW Continue Current Plan of Care  Follow Up Plan:   Telephone follow-up in 2 weeks Referral to BSW  Rosaline Finlay, RN MSN [place]  [facility] RN Care Manager Direct Dial: [phone]  Fax: [phone]

## 2024-10-05 NOTE — Patient Outreach (Signed)
 A second unsuccessful outreach was attempted today to connect patient with health plan and to conduct assessment for resource needs per PCP request. No answer. HIPAA compliant voicemail left requesting return call.  Rosaline Finlay, RN MSN [place]  [facility] Population Health RN Care Manager Direct Dial: [phone]  Fax: [phone]

## 2024-10-05 NOTE — Patient Instructions (Signed)
 Visit Information  Thank you for taking time to visit with me today. Please don't hesitate to contact me if I can be of assistance to you before our next scheduled appointment.  I will call you again in 2 weeks to see how your visit with Tobias Moose, BSW went regarding resource needs.  Please call the care guide team at [phone] if you need to cancel or reschedule your appointment.   Following is a copy of your care plan:   Goals Addressed             This Visit's Progress    VBCI RN Care Plan   On track    Problems:  Care Coordination needs related to assistance with household tasks and navigating health benefits  Goal: Over the next 30 days the Patient will work with child psychotherapist to address house assistance needs, navigating health benefits related to the management of chronic conditions as evidenced by review of electronic medical record and patient or social worker report      Interventions:   Evaluation of current treatment plan related to DMII and HTN, resource needs self-management and patient's adherence to plan as established by provider. Discussed plans with patient for ongoing care management follow up and provided patient with direct contact information for care management team Reviewed medications with patient and discussed medication compliance Social Work referral for assistance navigating insurance benefits, household task resources Screening for signs and symptoms of depression related to chronic disease state  Assessed social determinant of health barriers Advised patient that per her [facility] C-SNP, she should have an assigned case production designer, theatre/television/film. Unfortunately, we are unable to identify who her case manager is through calls to [employer]. Patient states she thought her insurance included a benefit for personal care services for household tasks, but she is unable to get through to anyone to get further information. She does have yearly home visits from [facility]  RN.  Patient Self-Care Activities:  Attend all scheduled provider appointments Call provider office for new concerns or questions  Perform all self care activities independently  Perform IADL's (shopping, preparing meals, housekeeping, managing finances) independently Take medications as prescribed   Work with the social worker to address care coordination needs and will continue to work with the clinical team to address health care and disease management related needs  Plan:  The care management team will reach out to the patient again over the next 14 days.             Please call the Suicide and Crisis Lifeline: 988 call 1-800-273-TALK (toll free, 24 hour hotline) if you are experiencing a Mental Health or Behavioral Health Crisis or need someone to talk to.  Patient verbalized understanding of Care plan and visit instructions communicated this visit  Rosaline Finlay, RN MSN [place]  [facility] RN Care Manager Direct Dial: [phone]  Fax: [phone]

## 2024-10-16 ENCOUNTER — Other Ambulatory Visit: Payer: Self-pay | Admitting: Licensed Clinical Social Worker

## 2024-10-16 NOTE — Patient Outreach (Signed)
 Social Drivers of Health  Community Resource and Care Coordination Visit Note   10/16/2024  Name: Taylor Edwards MRN: [id number] DOB:May 29, 1941  Situation: Referral received for [facility] needs assessment and assistance related to insurance benefits regarding HHA assistance for cleaning and cooking . I obtained verbal consent from Patient.  Visit completed with Patient on the phone.   Background:   SDOH Interventions Today    Flowsheet Row Most Recent Value  SDOH Interventions   Food Insecurity Interventions Intervention Not Indicated  Housing Interventions Intervention Not Indicated  Transportation Interventions Intervention Not Indicated  Utilities Interventions Intervention Not Indicated     Assessment:   Goals Addressed             This Visit's Progress    BSW VBCI Social Work Care Plan       Current SDOH Barriers:  Financial constraints related to extra HHA assistance in the home Limited social support Patient wants extra HHA assistance to help with cooking meals and cleaning   Interventions: Patient interviewed and appropriate screenings performed Provided patient with information about [employer] benefits Advised patient to call insurance to see about benefits, patient stated that she also has Engineer, technical sales and she is also going to call them . Patient stated that she has not called but will try to call to day and wants SW to follow up with her.           Recommendation:   attend all scheduled provider appointments Call [facility] and [employer] to inquire about benefits for HHA for cleaning and cooking   Follow Up Plan:   Telephone follow up appointment date/time:  10/30/2024 at 11:00 am  Tobias CHARM Maranda HEDWIG, PhD [facility], [facility] Social Worker Direct Dial: [phone]  Fax: [phone]

## 2024-10-16 NOTE — Patient Instructions (Signed)
 Visit Information  Thank you for taking time to visit with me today. Please don't hesitate to contact me if I can be of assistance to you before our next scheduled appointment.  Our next appointment is by telephone on 10/30/2024 at 11:00 am Please call the care guide team at [phone] if you need to cancel or reschedule your appointment.   Following is a copy of your care plan:   Goals Addressed             This Visit's Progress    BSW VBCI Social Work Care Plan       Current SDOH Barriers:  Financial constraints related to extra HHA assistance in the home Limited social support Patient wants extra HHA assistance to help with cooking meals and cleaning   Interventions: Patient interviewed and appropriate screenings performed Provided patient with information about [employer] benefits Advised patient to call insurance to see about benefits, patient stated that she also has Engineer, technical sales and she is also going to call them . Patient stated that she has not called but will try to call to day and wants SW to follow up with her.           Please call the Suicide and Crisis Lifeline: 988 go to [facility] [address] [place] [phone]) call 911 if you are experiencing a Mental Health or Behavioral Health Crisis or need someone to talk to.  Patient verbalized understanding of Care plan and visit instructions communicated this visit  Tobias CHARM Maranda HEDWIG, PhD [facility], [facility] Social Worker Direct Dial: [phone]  Fax: [phone]

## 2024-10-19 ENCOUNTER — Telehealth: Payer: Self-pay

## 2024-10-19 NOTE — Patient Outreach (Signed)
 Call placed to patient for quick check-in. Patient reports she has not had a chance to contact her insurance to ask if she has a benefit to cover household cleaning and cooking. Reminded patient to do so prior to follow-up with BSW 10/30/24. Patient denies concerns related to chronic conditions at this time. Ensured patient has care team contact information.  Rosaline Finlay, RN MSN [place]  VBCI Population Health RN Care Manager Direct Dial: [phone]  Fax: [phone]

## 2024-10-30 ENCOUNTER — Other Ambulatory Visit: Admitting: Licensed Clinical Social Worker
